# Patient Record
Sex: Female | Born: 1935 | Race: White | Hispanic: No | Smoking: Never smoker
Health system: Southern US, Community
[De-identification: ages and names within clinical notes are randomized; demographics above are authoritative.]

## PROBLEM LIST (undated history)

## (undated) DIAGNOSIS — N3281 Overactive bladder: Secondary | ICD-10-CM

## (undated) DIAGNOSIS — C801 Malignant (primary) neoplasm, unspecified: Secondary | ICD-10-CM

## (undated) DIAGNOSIS — Z972 Presence of dental prosthetic device (complete) (partial): Secondary | ICD-10-CM

## (undated) DIAGNOSIS — M1990 Unspecified osteoarthritis, unspecified site: Secondary | ICD-10-CM

## (undated) DIAGNOSIS — Z974 Presence of external hearing-aid: Secondary | ICD-10-CM

## (undated) DIAGNOSIS — H3530 Unspecified macular degeneration: Secondary | ICD-10-CM

## (undated) DIAGNOSIS — G8929 Other chronic pain: Secondary | ICD-10-CM

## (undated) DIAGNOSIS — I10 Essential (primary) hypertension: Secondary | ICD-10-CM

## (undated) DIAGNOSIS — K56609 Unspecified intestinal obstruction, unspecified as to partial versus complete obstruction: Secondary | ICD-10-CM

## (undated) DIAGNOSIS — T7840XA Allergy, unspecified, initial encounter: Secondary | ICD-10-CM

## (undated) DIAGNOSIS — M542 Cervicalgia: Secondary | ICD-10-CM

## (undated) DIAGNOSIS — R42 Dizziness and giddiness: Secondary | ICD-10-CM

## (undated) DIAGNOSIS — R32 Unspecified urinary incontinence: Secondary | ICD-10-CM

## (undated) DIAGNOSIS — N390 Urinary tract infection, site not specified: Secondary | ICD-10-CM

## (undated) DIAGNOSIS — M810 Age-related osteoporosis without current pathological fracture: Secondary | ICD-10-CM

## (undated) DIAGNOSIS — G2581 Restless legs syndrome: Secondary | ICD-10-CM

## (undated) DIAGNOSIS — M4800 Spinal stenosis, site unspecified: Secondary | ICD-10-CM

## (undated) HISTORY — PX: FRACTURE SURGERY: SHX138

## (undated) HISTORY — DX: Other chronic pain: G89.29

## (undated) HISTORY — PX: BLADDER SURGERY: SHX569

## (undated) HISTORY — PX: APPENDECTOMY: SHX54

## (undated) HISTORY — DX: Malignant (primary) neoplasm, unspecified: C80.1

## (undated) HISTORY — DX: Urinary tract infection, site not specified: N39.0

## (undated) HISTORY — DX: Unspecified urinary incontinence: R32

## (undated) HISTORY — PX: OTHER SURGICAL HISTORY: SHX169

## (undated) HISTORY — PX: HIP SURGERY: SHX245

## (undated) HISTORY — PX: CARPAL TUNNEL RELEASE: SHX101

## (undated) HISTORY — DX: Cervicalgia: M54.2

## (undated) HISTORY — DX: Allergy, unspecified, initial encounter: T78.40XA

## (undated) HISTORY — PX: TONSILLECTOMY: SUR1361

## (undated) HISTORY — DX: Unspecified macular degeneration: H35.30

## (undated) HISTORY — PX: ABDOMINAL HYSTERECTOMY: SHX81

## (undated) HISTORY — DX: Restless legs syndrome: G25.81

## (undated) HISTORY — DX: Essential (primary) hypertension: I10

## (undated) HISTORY — DX: Unspecified intestinal obstruction, unspecified as to partial versus complete obstruction: K56.609

## (undated) HISTORY — DX: Overactive bladder: N32.81

## (undated) HISTORY — DX: Unspecified osteoarthritis, unspecified site: M19.90

---

## 1968-03-16 HISTORY — PX: BREAST EXCISIONAL BIOPSY: SUR124

## 2004-03-06 ENCOUNTER — Ambulatory Visit: Payer: Self-pay | Admitting: Internal Medicine

## 2004-03-18 ENCOUNTER — Ambulatory Visit: Payer: Self-pay | Admitting: Internal Medicine

## 2005-03-26 ENCOUNTER — Ambulatory Visit: Payer: Self-pay

## 2005-08-20 ENCOUNTER — Emergency Department: Payer: Self-pay | Admitting: Emergency Medicine

## 2005-08-31 ENCOUNTER — Emergency Department: Payer: Self-pay | Admitting: Emergency Medicine

## 2005-10-23 ENCOUNTER — Ambulatory Visit: Payer: Self-pay | Admitting: Unknown Physician Specialty

## 2005-10-23 ENCOUNTER — Other Ambulatory Visit: Payer: Self-pay

## 2005-10-29 ENCOUNTER — Inpatient Hospital Stay: Payer: Self-pay | Admitting: Unknown Physician Specialty

## 2006-04-29 ENCOUNTER — Ambulatory Visit: Payer: Self-pay | Admitting: Internal Medicine

## 2007-06-22 ENCOUNTER — Ambulatory Visit: Payer: Self-pay | Admitting: Internal Medicine

## 2007-07-06 ENCOUNTER — Ambulatory Visit: Payer: Self-pay | Admitting: Internal Medicine

## 2008-07-06 ENCOUNTER — Ambulatory Visit: Payer: Self-pay | Admitting: Internal Medicine

## 2009-01-31 ENCOUNTER — Ambulatory Visit: Payer: Self-pay | Admitting: Urology

## 2009-02-12 ENCOUNTER — Ambulatory Visit: Payer: Self-pay | Admitting: Urology

## 2009-07-10 ENCOUNTER — Ambulatory Visit: Payer: Self-pay | Admitting: Internal Medicine

## 2009-11-27 ENCOUNTER — Ambulatory Visit: Payer: Self-pay | Admitting: Internal Medicine

## 2009-12-14 ENCOUNTER — Ambulatory Visit: Payer: Self-pay | Admitting: Internal Medicine

## 2010-07-28 ENCOUNTER — Ambulatory Visit: Payer: Self-pay | Admitting: Internal Medicine

## 2010-08-12 ENCOUNTER — Ambulatory Visit: Payer: Self-pay | Admitting: Internal Medicine

## 2010-08-19 ENCOUNTER — Ambulatory Visit: Payer: Self-pay | Admitting: Internal Medicine

## 2010-10-16 ENCOUNTER — Ambulatory Visit: Payer: Self-pay | Admitting: Unknown Physician Specialty

## 2011-06-23 ENCOUNTER — Ambulatory Visit: Payer: Self-pay | Admitting: Internal Medicine

## 2011-06-25 LAB — PROT IMMUNOELECTROPHORES(ARMC)

## 2011-07-15 ENCOUNTER — Ambulatory Visit: Payer: Self-pay | Admitting: Internal Medicine

## 2011-07-29 ENCOUNTER — Ambulatory Visit: Payer: Self-pay | Admitting: Internal Medicine

## 2012-08-02 ENCOUNTER — Ambulatory Visit: Payer: Self-pay | Admitting: Internal Medicine

## 2013-02-13 ENCOUNTER — Ambulatory Visit: Payer: Self-pay

## 2013-04-12 DIAGNOSIS — R350 Frequency of micturition: Secondary | ICD-10-CM | POA: Insufficient documentation

## 2013-04-12 DIAGNOSIS — N3941 Urge incontinence: Secondary | ICD-10-CM | POA: Insufficient documentation

## 2013-04-12 DIAGNOSIS — N3020 Other chronic cystitis without hematuria: Secondary | ICD-10-CM | POA: Insufficient documentation

## 2013-04-12 DIAGNOSIS — R339 Retention of urine, unspecified: Secondary | ICD-10-CM | POA: Insufficient documentation

## 2013-06-08 ENCOUNTER — Ambulatory Visit (INDEPENDENT_AMBULATORY_CARE_PROVIDER_SITE_OTHER): Payer: Medicare Other

## 2013-06-08 ENCOUNTER — Ambulatory Visit (INDEPENDENT_AMBULATORY_CARE_PROVIDER_SITE_OTHER): Payer: Medicare Other | Admitting: Podiatrist

## 2013-06-08 ENCOUNTER — Encounter: Payer: Self-pay | Admitting: Podiatrist

## 2013-06-08 VITALS — BP 133/64 | HR 69 | Resp 16 | Ht 67.0 in | Wt 150.0 lb

## 2013-06-08 DIAGNOSIS — B351 Tinea unguium: Secondary | ICD-10-CM

## 2013-06-08 DIAGNOSIS — M79609 Pain in unspecified limb: Secondary | ICD-10-CM

## 2013-06-08 DIAGNOSIS — M79673 Pain in unspecified foot: Secondary | ICD-10-CM

## 2013-06-08 DIAGNOSIS — M2040 Other hammer toe(s) (acquired), unspecified foot: Secondary | ICD-10-CM

## 2013-06-08 NOTE — Patient Instructions (Signed)
Hammer Toes Hammer toes is a condition in which one or more of your toes is permanently flexed. CAUSES  This happens when a muscle imbalance or abnormal bone length makes your small toes buckle. This causes the toe joint to contract and the strong cord-like bands that attach muscles to the bones (tendons) in your toes to shorten.  SIGNS AND SYMPTOMS  Common symptoms of flexible hammer toes include:   A build up of skin cells (Corns). Corns occur where boney bumps come in frequent contact with hard surfaces. For example, where your shoes press and rub.  Irritation.  Inflammation.  Pain.  Limited motion in your toes DIAGNOSIS  Hammer toes are diagnosed through a physical exam of your toes.During the exam, your health care provider may try to reproduce your symptoms by manipulating your foot. Often, x-ray exams are done to determine the degree of deformity and to make sure that the cause is not a fracture.  TREATMENT  Hammer toes can be treated with corrective surgery. There are several types of surgical procedures that can treat hammer toes. The most common procedures include:  Arthroplasty A portion of the joint is surgically removed and your toe is straightened. The gap fills in with fibrous tissue. This procedure helps treat pain and deformity and helps restore function.  Fusion Cartilage between the two bones of the affected joint is taken out and the bones fuse together into one longer bone. This helps keep your toe stable and reduces pain but leaves your toe stiff, yet straight.  Implantation A portion of your bone is removed and replaced with an implant to restore motion.  Flexor tendon transfers This procedure repositions the tendons that curl the toes down (flexor tendons). This may be done to release the deforming force that causes your toe to buckle. Several of these procedures require fixing your toe with a pin that is visible at the tip of your toe. The pin keeps the toe  straight during healing. Your health care provider will remove the pin usually within 4 8 weeks after the procedure.  Document Released: 02/28/2000 Document Revised: 12/21/2012 Document Reviewed: 11/07/2012 [employer] Patient Information 2014 [employer].  

## 2013-06-08 NOTE — Progress Notes (Signed)
   Subjective:    Patient ID: Kristine Jacobson, female    DOB: 09-Jul-1923, 80 y.o.   MRN: [id number]  HPI Comments: N pain/ burn L b/l feet only toes/ trim toenails D 3 - 22yrs O slowly C worse A heat,  T took rx?. otc rx, soaks feet in epson salt, pt trims toenails      Review of Systems  Constitutional: Positive for fatigue.       Weight changes  HENT: Positive for hearing loss.        Ringing in ears Sinus problems  Eyes: Positive for itching and visual disturbance.  Respiratory: Negative.   Cardiovascular: Negative.   Gastrointestinal: Negative.   Endocrine:       Increase urination  Genitourinary: Positive for urgency and frequency.  Musculoskeletal:       Joint pain Back pain Muscle pain   Skin:       Change in nails  Allergic/Immunologic: Negative.   Neurological: Positive for weakness and light-headedness.  Hematological: Bruises/bleeds easily.  Psychiatric/Behavioral: Negative.        Objective:   Physical Exam  GENERAL APPEARANCE: Alert, conversant. Appropriately groomed. No acute distress.  VASCULAR: Pedal pulses palpable at 1/4 DP and PT bilateral.  Capillary refill time is immediate to all digits,  Proximal to distal cooling it warm to warm.  Digital hair growth is present bilateral  NEUROLOGIC: sensation is intact epicritically and protectively to 5.07 monofilament at 5/5 sites bilateral.  Light touch is intact bilateral, vibratory sensation intact bilateral, achilles tendon reflex is intact bilateral.  MUSCULOSKELETAL: semirigid contracture of digits 2,3,4 of bilateral feet are noted.  Discomfort at the tips of the toes is present with pressure and examination;  Mild pronated foot type is present DERMATOLOGIC: nails are thick, discolored, and elongated.  Keratotic lesions are present at the tips of toes 2,3,4 left and 2 right.  Pressure and shear forces are causing pain.       Assessment & Plan:  Hammertoe contracture with keratotic lesions at distal  tips,  Symptomatic mycotic toenails.  Plan:  Debrided the toenails without complication today.  Discussed conservative vs. Surgical therapies for the hammertoes including padding, shoegear changes, and staying off her feet more.  The patient would like to know more about surgery.  We discussed a hammertoe procedure to fuse the digits in a straightened position with temporary kwires or screws. She will consider her options and will call if she would like to schedule surgery.  I will see her for a pre op consult at that time.

## 2013-08-21 ENCOUNTER — Ambulatory Visit: Payer: Self-pay | Admitting: Internal Medicine

## 2013-11-22 DIAGNOSIS — R109 Unspecified abdominal pain: Secondary | ICD-10-CM | POA: Insufficient documentation

## 2013-11-29 ENCOUNTER — Ambulatory Visit: Payer: Self-pay

## 2014-08-15 ENCOUNTER — Other Ambulatory Visit: Payer: Self-pay | Admitting: Internal Medicine

## 2014-08-15 DIAGNOSIS — M5442 Lumbago with sciatica, left side: Principal | ICD-10-CM

## 2014-08-15 DIAGNOSIS — M5441 Lumbago with sciatica, right side: Secondary | ICD-10-CM

## 2014-08-24 ENCOUNTER — Ambulatory Visit: Payer: Self-pay

## 2014-08-30 ENCOUNTER — Ambulatory Visit
Admission: RE | Admit: 2014-08-30 | Discharge: 2014-08-30 | Disposition: A | Discharge status: Home / Self Care | Payer: Medicare Other | Source: Ambulatory Visit | Attending: Internal Medicine | Admitting: Internal Medicine

## 2014-08-30 DIAGNOSIS — M5186 Other intervertebral disc disorders, lumbar region: Secondary | ICD-10-CM | POA: Diagnosis not present

## 2014-08-30 DIAGNOSIS — M5442 Lumbago with sciatica, left side: Secondary | ICD-10-CM

## 2014-08-30 DIAGNOSIS — M4806 Spinal stenosis, lumbar region: Secondary | ICD-10-CM | POA: Diagnosis not present

## 2014-08-30 DIAGNOSIS — M5136 Other intervertebral disc degeneration, lumbar region: Secondary | ICD-10-CM | POA: Diagnosis present

## 2014-08-30 DIAGNOSIS — M5441 Lumbago with sciatica, right side: Secondary | ICD-10-CM

## 2014-09-21 DIAGNOSIS — N393 Stress incontinence (female) (male): Secondary | ICD-10-CM | POA: Insufficient documentation

## 2015-01-07 ENCOUNTER — Other Ambulatory Visit: Payer: Self-pay | Admitting: Internal Medicine

## 2015-01-07 ENCOUNTER — Ambulatory Visit
Admission: RE | Admit: 2015-01-07 | Discharge: 2015-01-07 | Disposition: A | Discharge status: Home / Self Care | Payer: Medicare Other | Source: Ambulatory Visit | Attending: Internal Medicine | Admitting: Internal Medicine

## 2015-01-07 DIAGNOSIS — R1030 Lower abdominal pain, unspecified: Secondary | ICD-10-CM | POA: Insufficient documentation

## 2015-01-25 ENCOUNTER — Other Ambulatory Visit: Payer: Self-pay | Admitting: Internal Medicine

## 2015-01-25 DIAGNOSIS — R1010 Upper abdominal pain, unspecified: Secondary | ICD-10-CM

## 2015-02-05 ENCOUNTER — Other Ambulatory Visit: Payer: Medicare Other

## 2015-02-05 ENCOUNTER — Encounter
Admission: RE | Admit: 2015-02-05 | Discharge: 2015-02-05 | Disposition: A | Discharge status: Home / Self Care | Payer: Medicare Other | Source: Ambulatory Visit | Attending: Internal Medicine | Admitting: Internal Medicine

## 2015-02-05 DIAGNOSIS — R1010 Upper abdominal pain, unspecified: Secondary | ICD-10-CM | POA: Diagnosis not present

## 2015-02-05 MED ORDER — TECHNETIUM TC 99M MEBROFENIN IV KIT
5.0000 | PACK | Freq: Once | INTRAVENOUS | Status: DC | PRN
  Administered 2015-02-05: 5.062 via INTRAVENOUS
  Filled 2015-02-05: qty 6

## 2015-02-05 MED ORDER — SINCALIDE 5 MCG IJ SOLR
0.0200 ug/kg | Freq: Once | INTRAMUSCULAR | Status: AC
  Administered 2015-02-05: 1.36 ug via INTRAVENOUS

## 2015-02-19 ENCOUNTER — Other Ambulatory Visit: Payer: Self-pay | Admitting: Internal Medicine

## 2015-02-19 DIAGNOSIS — Z1231 Encounter for screening mammogram for malignant neoplasm of breast: Secondary | ICD-10-CM

## 2015-02-27 ENCOUNTER — Encounter
Admission: EM | Disposition: A | Discharge status: Skilled Nursing Facility | Payer: Self-pay | Source: Home / Self Care | Attending: Internal Medicine

## 2015-02-27 ENCOUNTER — Encounter: Payer: Self-pay | Admitting: Emergency Medicine

## 2015-02-27 ENCOUNTER — Inpatient Hospital Stay: Payer: Medicare Other | Admitting: Anesthesiology

## 2015-02-27 ENCOUNTER — Inpatient Hospital Stay: Payer: Medicare Other

## 2015-02-27 ENCOUNTER — Emergency Department: Payer: Medicare Other

## 2015-02-27 ENCOUNTER — Ambulatory Visit: Payer: Medicare Other

## 2015-02-27 ENCOUNTER — Inpatient Hospital Stay
Admission: EM | Admit: 2015-02-27 | Discharge: 2015-03-05 | DRG: 482 | Disposition: A | Discharge status: Skilled Nursing Facility | Payer: Medicare Other | Attending: Internal Medicine | Admitting: Internal Medicine

## 2015-02-27 DIAGNOSIS — M1990 Unspecified osteoarthritis, unspecified site: Secondary | ICD-10-CM | POA: Diagnosis present

## 2015-02-27 DIAGNOSIS — K5900 Constipation, unspecified: Secondary | ICD-10-CM | POA: Diagnosis present

## 2015-02-27 DIAGNOSIS — D649 Anemia, unspecified: Secondary | ICD-10-CM | POA: Diagnosis present

## 2015-02-27 DIAGNOSIS — Z9889 Other specified postprocedural states: Secondary | ICD-10-CM | POA: Diagnosis not present

## 2015-02-27 DIAGNOSIS — S72002A Fracture of unspecified part of neck of left femur, initial encounter for closed fracture: Secondary | ICD-10-CM | POA: Diagnosis present

## 2015-02-27 DIAGNOSIS — Y92009 Unspecified place in unspecified non-institutional (private) residence as the place of occurrence of the external cause: Secondary | ICD-10-CM

## 2015-02-27 DIAGNOSIS — G2581 Restless legs syndrome: Secondary | ICD-10-CM | POA: Diagnosis present

## 2015-02-27 DIAGNOSIS — W19XXXA Unspecified fall, initial encounter: Secondary | ICD-10-CM

## 2015-02-27 DIAGNOSIS — Z833 Family history of diabetes mellitus: Secondary | ICD-10-CM | POA: Diagnosis not present

## 2015-02-27 DIAGNOSIS — R0902 Hypoxemia: Secondary | ICD-10-CM

## 2015-02-27 DIAGNOSIS — N3281 Overactive bladder: Secondary | ICD-10-CM | POA: Diagnosis present

## 2015-02-27 DIAGNOSIS — S72142A Displaced intertrochanteric fracture of left femur, initial encounter for closed fracture: Secondary | ICD-10-CM | POA: Diagnosis not present

## 2015-02-27 DIAGNOSIS — Z79899 Other long term (current) drug therapy: Secondary | ICD-10-CM

## 2015-02-27 DIAGNOSIS — I10 Essential (primary) hypertension: Secondary | ICD-10-CM | POA: Diagnosis present

## 2015-02-27 DIAGNOSIS — H3530 Unspecified macular degeneration: Secondary | ICD-10-CM | POA: Diagnosis present

## 2015-02-27 DIAGNOSIS — W010XXA Fall on same level from slipping, tripping and stumbling without subsequent striking against object, initial encounter: Secondary | ICD-10-CM | POA: Diagnosis present

## 2015-02-27 DIAGNOSIS — Z886 Allergy status to analgesic agent status: Secondary | ICD-10-CM | POA: Diagnosis not present

## 2015-02-27 DIAGNOSIS — J9601 Acute respiratory failure with hypoxia: Secondary | ICD-10-CM

## 2015-02-27 DIAGNOSIS — R509 Fever, unspecified: Secondary | ICD-10-CM

## 2015-02-27 DIAGNOSIS — M810 Age-related osteoporosis without current pathological fracture: Secondary | ICD-10-CM | POA: Diagnosis present

## 2015-02-27 DIAGNOSIS — Z90710 Acquired absence of both cervix and uterus: Secondary | ICD-10-CM

## 2015-02-27 DIAGNOSIS — Z419 Encounter for procedure for purposes other than remedying health state, unspecified: Secondary | ICD-10-CM

## 2015-02-27 HISTORY — PX: INTRAMEDULLARY (IM) NAIL INTERTROCHANTERIC: SHX5875

## 2015-02-27 HISTORY — DX: Spinal stenosis, site unspecified: M48.00

## 2015-02-27 HISTORY — DX: Age-related osteoporosis without current pathological fracture: M81.0

## 2015-02-27 LAB — CBC WITH DIFFERENTIAL/PLATELET
BASOS PCT: 1 %
Basophils Absolute: 0.1 10*3/uL (ref 0–0.1)
EOS ABS: 0 10*3/uL (ref 0–0.7)
Eosinophils Relative: 0 %
HCT: 38.3 % (ref 35.0–47.0)
HEMOGLOBIN: 12.9 g/dL (ref 12.0–16.0)
LYMPHS ABS: 1.2 10*3/uL (ref 1.0–3.6)
Lymphocytes Relative: 10 %
MCH: 30.3 pg (ref 26.0–34.0)
MCHC: 33.7 g/dL (ref 32.0–36.0)
MCV: 89.9 fL (ref 80.0–100.0)
Monocytes Absolute: 0.6 10*3/uL (ref 0.2–0.9)
Monocytes Relative: 6 %
NEUTROS PCT: 83 %
Neutro Abs: 9.3 10*3/uL — ABNORMAL HIGH (ref 1.4–6.5)
Platelets: 167 10*3/uL (ref 150–440)
RBC: 4.26 MIL/uL (ref 3.80–5.20)
RDW: 13.7 % (ref 11.5–14.5)
WBC: 11.2 10*3/uL — AB (ref 3.6–11.0)

## 2015-02-27 LAB — COMPREHENSIVE METABOLIC PANEL
ALBUMIN: 4.3 g/dL (ref 3.5–5.0)
ALK PHOS: 66 U/L (ref 38–126)
ALT: 26 U/L (ref 14–54)
AST: 34 U/L (ref 15–41)
Anion gap: 9 (ref 5–15)
BUN: 21 mg/dL — ABNORMAL HIGH (ref 6–20)
CALCIUM: 9.3 mg/dL (ref 8.9–10.3)
CO2: 25 mmol/L (ref 22–32)
CREATININE: 0.68 mg/dL (ref 0.44–1.00)
Chloride: 103 mmol/L (ref 101–111)
GFR calc Af Amer: >60 mL/min (ref >60)
GFR calc non Af Amer: >60 mL/min (ref >60)
GLUCOSE: 147 mg/dL — AB (ref 65–99)
Potassium: 3.5 mmol/L (ref 3.5–5.1)
SODIUM: 137 mmol/L (ref 135–145)
Total Bilirubin: 0.8 mg/dL (ref 0.3–1.2)
Total Protein: 7.6 g/dL (ref 6.5–8.1)

## 2015-02-27 LAB — URINALYSIS COMPLETE WITH MICROSCOPIC (ARMC ONLY)
BACTERIA UA: NONE SEEN
Bilirubin Urine: NEGATIVE
GLUCOSE, UA: NEGATIVE mg/dL
HGB URINE DIPSTICK: NEGATIVE
Leukocytes, UA: NEGATIVE
Nitrite: NEGATIVE
Protein, ur: 30 mg/dL — AB
Specific Gravity, Urine: 1.017 (ref 1.005–1.030)
pH: 5 (ref 5.0–8.0)

## 2015-02-27 LAB — TYPE AND SCREEN
ABO/RH(D): A POS
ANTIBODY SCREEN: NEGATIVE

## 2015-02-27 LAB — SURGICAL PCR SCREEN
MRSA, PCR: NEGATIVE
STAPHYLOCOCCUS AUREUS: NEGATIVE

## 2015-02-27 LAB — TROPONIN I

## 2015-02-27 SURGERY — FIXATION, FRACTURE, INTERTROCHANTERIC, WITH INTRAMEDULLARY ROD
Anesthesia: Spinal | Site: Hip | Laterality: Left | Wound class: Clean

## 2015-02-27 MED ORDER — ONDANSETRON HCL 4 MG/2ML IJ SOLN: 4.0000 mg | Freq: Once | INTRAMUSCULAR | Status: DC | PRN

## 2015-02-27 MED ORDER — LIDOCAINE HCL 1 % IJ SOLN
INTRAMUSCULAR | Status: DC | PRN
  Administered 2015-02-27: 10 mL

## 2015-02-27 MED ORDER — NEOMYCIN-POLYMYXIN B GU 40-200000 IR SOLN
Status: DC | PRN
  Administered 2015-02-27: 4 mL

## 2015-02-27 MED ORDER — SODIUM CHLORIDE 0.9 % IV SOLN
INTRAVENOUS | Status: DC
  Administered 2015-02-27: 12:00:00 via INTRAVENOUS

## 2015-02-27 MED ORDER — PROPOFOL 500 MG/50ML IV EMUL
INTRAVENOUS | Status: DC | PRN
  Administered 2015-02-27: 15 ug/kg/min via INTRAVENOUS

## 2015-02-27 MED ORDER — ENOXAPARIN SODIUM 40 MG/0.4ML ~~LOC~~ SOLN: 40.0000 mg | SUBCUTANEOUS | Status: DC

## 2015-02-27 MED ORDER — LACTATED RINGERS IV SOLN
INTRAVENOUS | Status: DC | PRN
  Administered 2015-02-27: 21:00:00 via INTRAVENOUS

## 2015-02-27 MED ORDER — LIDOCAINE HCL (PF) 1 % IJ SOLN
INTRAMUSCULAR | Status: AC
  Filled 2015-02-27: qty 30

## 2015-02-27 MED ORDER — EPINEPHRINE HCL 1 MG/ML IJ SOLN
INTRAMUSCULAR | Status: DC | PRN
  Administered 2015-02-27: .2 mg via INTRATRACHEAL

## 2015-02-27 MED ORDER — NEOMYCIN-POLYMYXIN B GU 40-200000 IR SOLN
Status: AC
  Filled 2015-02-27: qty 4

## 2015-02-27 MED ORDER — FENTANYL CITRATE (PF) 100 MCG/2ML IJ SOLN: 25.0000 ug | INTRAMUSCULAR | Status: DC | PRN

## 2015-02-27 MED ORDER — ASPIRIN 81 MG PO CHEW
81.0000 mg | CHEWABLE_TABLET | Freq: Every day | ORAL | Status: DC
  Administered 2015-02-28 – 2015-03-03 (×4): 81 mg via ORAL
  Filled 2015-02-27 (×4): qty 1

## 2015-02-27 MED ORDER — LINACLOTIDE 145 MCG PO CAPS
145.0000 ug | ORAL_CAPSULE | ORAL | Status: DC
  Administered 2015-02-28 – 2015-03-04 (×2): 145 ug via ORAL
  Filled 2015-02-27 (×2): qty 1

## 2015-02-27 MED ORDER — CARVEDILOL 3.125 MG PO TABS
3.1250 mg | ORAL_TABLET | Freq: Two times a day (BID) | ORAL | Status: DC
  Filled 2015-02-27: qty 1

## 2015-02-27 MED ORDER — HYDROCHLOROTHIAZIDE 12.5 MG PO CAPS
12.5000 mg | ORAL_CAPSULE | Freq: Every day | ORAL | Status: DC
  Administered 2015-02-28 – 2015-03-05 (×6): 12.5 mg via ORAL
  Filled 2015-02-27 (×8): qty 1

## 2015-02-27 MED ORDER — GABAPENTIN 100 MG PO CAPS
100.0000 mg | ORAL_CAPSULE | Freq: Three times a day (TID) | ORAL | Status: DC
  Administered 2015-02-28 – 2015-03-05 (×17): 100 mg via ORAL
  Filled 2015-02-27 (×17): qty 1

## 2015-02-27 MED ORDER — MORPHINE SULFATE (PF) 4 MG/ML IV SOLN
INTRAVENOUS | Status: AC
  Administered 2015-02-27: 4 mg via INTRAVENOUS
  Filled 2015-02-27: qty 1

## 2015-02-27 MED ORDER — BUPIVACAINE HCL (PF) 0.75 % IJ SOLN
INTRAMUSCULAR | Status: DC | PRN
  Administered 2015-02-27: 1.8 mL via INTRATHECAL

## 2015-02-27 MED ORDER — CARVEDILOL 3.125 MG PO TABS
3.1250 mg | ORAL_TABLET | Freq: Two times a day (BID) | ORAL | Status: DC
  Administered 2015-02-27 – 2015-03-05 (×12): 3.125 mg via ORAL
  Filled 2015-02-27 (×11): qty 1

## 2015-02-27 MED ORDER — MORPHINE SULFATE (PF) 2 MG/ML IV SOLN
2.0000 mg | INTRAVENOUS | Status: DC | PRN
  Administered 2015-02-27 (×2): 2 mg via INTRAVENOUS
  Filled 2015-02-27 (×2): qty 1

## 2015-02-27 MED ORDER — AMLODIPINE BESYLATE 5 MG PO TABS: 2.5000 mg | ORAL_TABLET | Freq: Every day | ORAL | Status: DC

## 2015-02-27 MED ORDER — AMLODIPINE BESYLATE 5 MG PO TABS
5.0000 mg | ORAL_TABLET | Freq: Every day | ORAL | Status: DC
  Administered 2015-02-28 – 2015-03-05 (×6): 5 mg via ORAL
  Filled 2015-02-27 (×6): qty 1

## 2015-02-27 MED ORDER — ACETAMINOPHEN 10 MG/ML IV SOLN
INTRAVENOUS | Status: AC
  Filled 2015-02-27: qty 100

## 2015-02-27 MED ORDER — ROPINIROLE HCL 0.25 MG PO TABS
0.5000 mg | ORAL_TABLET | Freq: Three times a day (TID) | ORAL | Status: DC
  Administered 2015-02-28 – 2015-03-05 (×17): 0.5 mg via ORAL
  Filled 2015-02-27 (×17): qty 2

## 2015-02-27 MED ORDER — CEFAZOLIN SODIUM-DEXTROSE 2-3 GM-% IV SOLR
2.0000 g | INTRAVENOUS | Status: AC
  Administered 2015-02-27: 2 g via INTRAVENOUS
  Filled 2015-02-27: qty 50

## 2015-02-27 MED ORDER — ACETAMINOPHEN 10 MG/ML IV SOLN
INTRAVENOUS | Status: DC | PRN
  Administered 2015-02-27: 1000 mg via INTRAVENOUS

## 2015-02-27 MED ORDER — KETAMINE HCL 10 MG/ML IJ SOLN
INTRAMUSCULAR | Status: DC | PRN
  Administered 2015-02-27: 5 mg via INTRAVENOUS

## 2015-02-27 MED ORDER — PANTOPRAZOLE SODIUM 40 MG PO TBEC: 40.0000 mg | DELAYED_RELEASE_TABLET | Freq: Every day | ORAL | Status: DC

## 2015-02-27 MED ORDER — LOSARTAN POTASSIUM-HCTZ 100-12.5 MG PO TABS: 1.0000 | ORAL_TABLET | Freq: Every day | ORAL | Status: DC

## 2015-02-27 MED ORDER — LOSARTAN POTASSIUM 50 MG PO TABS
100.0000 mg | ORAL_TABLET | Freq: Every day | ORAL | Status: DC
  Administered 2015-02-28 – 2015-03-05 (×6): 100 mg via ORAL
  Filled 2015-02-27 (×6): qty 2

## 2015-02-27 MED ORDER — MORPHINE SULFATE (PF) 4 MG/ML IV SOLN
4.0000 mg | Freq: Once | INTRAVENOUS | Status: AC
  Administered 2015-02-27: 4 mg via INTRAVENOUS

## 2015-02-27 MED ORDER — MIDAZOLAM HCL 5 MG/5ML IJ SOLN
INTRAMUSCULAR | Status: DC | PRN
  Administered 2015-02-27 (×2): 0.5 mg via INTRAVENOUS

## 2015-02-27 MED ORDER — FENTANYL CITRATE (PF) 100 MCG/2ML IJ SOLN
INTRAMUSCULAR | Status: DC | PRN
  Administered 2015-02-27 (×2): 50 ug via INTRAVENOUS

## 2015-02-27 SURGICAL SUPPLY — 31 items
CANISTER SUCT 1200ML W/VALVE (MISCELLANEOUS) ×2 IMPLANT
DRAPE C-ARMOR (DRAPES) IMPLANT
DRAPE SHEET LG 3/4 BI-LAMINATE (DRAPES) IMPLANT
DRAPE TABLE BACK 80X90 (DRAPES) ×2 IMPLANT
DRSG DERMACEA 8X12 NADH (GAUZE/BANDAGES/DRESSINGS) ×2 IMPLANT
DRSG OPSITE POSTOP 4X12 (GAUZE/BANDAGES/DRESSINGS) ×2 IMPLANT
DURAPREP 26ML APPLICATOR (WOUND CARE) ×2 IMPLANT
GAUZE SPONGE 4X4 12PLY STRL (GAUZE/BANDAGES/DRESSINGS) IMPLANT
GLOVE BIO SURGEON STRL SZ7.5 (GLOVE) ×4 IMPLANT
GLOVE BIO SURGEON STRL SZ8 (GLOVE) ×2 IMPLANT
GLOVE BIOGEL M STRL SZ7.5 (GLOVE) ×4 IMPLANT
GLOVE INDICATOR 8.0 STRL GRN (GLOVE) ×2 IMPLANT
GLOVE SURG XRAY 8.5 LX (GLOVE) IMPLANT
GOWN STRL REUS W/ TWL LRG LVL4 (GOWN DISPOSABLE) ×2 IMPLANT
GOWN STRL REUS W/TWL LRG LVL4 (GOWN DISPOSABLE) ×2
KIT RM TURNOVER CYSTO AR (KITS) ×2 IMPLANT
MAT BLUE FLOOR 46X72 FLO (MISCELLANEOUS) ×2 IMPLANT
NAIL TROCH FX 11/130D 170-S (Nail) ×2 IMPLANT
NS IRRIG 1000ML POUR BTL (IV SOLUTION) ×2 IMPLANT
PACK HIP COMPR (MISCELLANEOUS) ×2 IMPLANT
PAD GROUND ADULT SPLIT (MISCELLANEOUS) ×2 IMPLANT
REAMER ROD DEEP FLUTE 2.5X950 (INSTRUMENTS) ×2 IMPLANT
SOL PREP PVP 2OZ (MISCELLANEOUS) ×2
SOLUTION PREP PVP 2OZ (MISCELLANEOUS) ×1 IMPLANT
STAPLER SKIN PROX 35W (STAPLE) ×2 IMPLANT
SUCTION FRAZIER TIP 10 FR DISP (SUCTIONS) ×2 IMPLANT
SUT VIC AB 0 CT1 36 (SUTURE) ×2 IMPLANT
SUT VIC AB 1 CT1 36 (SUTURE) ×2 IMPLANT
SUT VIC AB 2-0 CT1 27 (SUTURE) ×1
SUT VIC AB 2-0 CT1 TAPERPNT 27 (SUTURE) ×1 IMPLANT
TAPE MICROFOAM 4IN (TAPE) IMPLANT

## 2015-02-27 NOTE — Progress Notes (Signed)
OR tech here to take patient for surgery, Report given to OR nurse, consents and abx on chart.  Patient taken to OR via bed, on oxygen at 2L per [place].

## 2015-02-27 NOTE — H&P (Signed)
[facility] [place] NAME: Kristine Jacobson    MR#:  [id number]  DATE OF BIRTH:  [birth year]  DATE OF ADMISSION:  02/27/2015  PRIMARY CARE PHYSICIAN: Lavera Guise, MD   REQUESTING/REFERRING PHYSICIAN: Dr. Esperanza Heir  CHIEF COMPLAINT:   Chief Complaint  Patient presents with  . Fall    HISTORY OF PRESENT ILLNESS:  Kristine Jacobson  is a 81 y.o. female with a known history of attention, overactive bladder, restless leg syndrome comes in because of fall and then left hip fracture. Patient had a mechanical fall tapered over the legs of recliner and then suffered a left hip fracture. Patient was visiting.daughter that had emergency appendectomy and slept very late like 2:00 in the morning. Did not have dizziness or chest pain. Had a mechanical fall.  PAST MEDICAL HISTORY:   Past Medical History  Diagnosis Date  . Arthritis   . Restless leg   . Overactive bladder   . Macular degeneration, age related   . HBP (high blood pressure)   . Spinal stenosis     PAST SURGICAL HISTOIRY:   Past Surgical History  Procedure Laterality Date  . Abdominal hysterectomy    . Tonsillectomy    . Bladder surgery    . Blocked intestine      SOCIAL HISTORY:   Social History  Substance Use Topics  . Smoking status: Never Smoker   . Smokeless tobacco: Not on file  . Alcohol Use: No    FAMILY HISTORY:  History reviewed. No pertinent family history. Family history significant for brother and sister have diabetes. DRUG ALLERGIES:   Allergies  Allergen Reactions  . Codeine Rash    REVIEW OF SYSTEMS:  CONSTITUTIONAL: No fever, fatigue or weakness.  EYES: No blurred or double vision.  EARS, NOSE, AND THROAT: No tinnitus or ear pain.  RESPIRATORY: No cough, shortness of breath, wheezing or hemoptysis.  CARDIOVASCULAR: No chest pain, orthopnea, edema.  GASTROINTESTINAL: No nausea, vomiting, diarrhea or abdominal pain.  GENITOURINARY: No  dysuria, hematuria.  ENDOCRINE: No polyuria, nocturia,  HEMATOLOGY: No anemia, easy bruising or bleeding SKIN: No rash or lesion. MUSCULOSKELETAL: No joint pain or arthritis.   NEUROLOGIC: No tingling, numbness, weakness.  PSYCHIATRY: No anxiety or depression.   MEDICATIONS AT HOME:   Prior to Admission medications   Medication Sig Start Date End Date Taking? Authorizing Provider  amLODipine (NORVASC) 2.5 MG tablet Take 2.5 mg by mouth daily.    Yes Historical Provider, MD  aspirin 81 MG tablet Take 81 mg by mouth daily.   Yes Historical Provider, MD  carvedilol (COREG) 3.125 MG tablet Take 3.125 mg by mouth 2 (two) times daily with a meal.   Yes Historical Provider, MD  gabapentin (NEURONTIN) 100 MG capsule Take 1 capsule by mouth 3 (three) times daily. 01/07/15  Yes Historical Provider, MD  LINZESS 145 MCG CAPS capsule Take 1 capsule by mouth 2 (two) times a week. 02/13/15  Yes Historical Provider, MD  losartan-hydrochlorothiazide (HYZAAR) 100-12.5 MG per tablet Take 1 tablet by mouth daily.   Yes Historical Provider, MD  rOPINIRole (REQUIP) 0.5 MG tablet Take 0.5 mg by mouth 3 (three) times daily.   Yes Historical Provider, MD      VITAL SIGNS:  Blood pressure 179/74, pulse 102, temperature 98 F (36.7 C), temperature source Oral, resp. rate 18, height 5\' 7"  (1.702 m), weight 68.04 kg (150 lb), SpO2 91 %.  PHYSICAL EXAMINATION:  GENERAL:  81  y.o.-year-old patient lying in the bed with no acute distress.  EYES: Pupils equal, round, reactive to light and accommodation. No scleral icterus. Extraocular muscles intact.  HEENT: Head atraumatic, normocephalic. Oropharynx and nasopharynx clear.  NECK:  Supple, no jugular venous distention. No thyroid enlargement, no tenderness.  LUNGS: Normal breath sounds bilaterally, no wheezing, rales,rhonchi or crepitation. No use of accessory muscles of respiration.  CARDIOVASCULAR: S1, S2 normal. No murmurs, rubs, or gallops.  ABDOMEN: Soft,  nontender, nondistended. Bowel sounds present. No organomegaly or mass.  EXTREMITIES: Shortening and rotation of the left leg.  NEUROLOGIC: Cranial nerves II through XII are intact. Muscle strength 5/5 in all extremities. Sensation intact. Gait not checked.  PSYCHIATRIC: The patient is alert and oriented x 3.  SKIN: No obvious rash, lesion, or ulcer.   LABORATORY PANEL:   CBC  Recent Labs Lab 02/27/15 0822  WBC 11.2*  HGB 12.9  HCT 38.3  PLT 167   ------------------------------------------------------------------------------------------------------------------  Chemistries   Recent Labs Lab 02/27/15 0822  NA 137  K 3.5  CL 103  CO2 25  GLUCOSE 147*  BUN 21*  CREATININE 0.68  CALCIUM 9.3  AST 34  ALT 26  ALKPHOS 66  BILITOT 0.8   ------------------------------------------------------------------------------------------------------------------  Cardiac Enzymes  Recent Labs Lab 02/27/15 0822  TROPONINI <0.03   ------------------------------------------------------------------------------------------------------------------  RADIOLOGY:  Dg Chest 1 View  02/27/2015  CLINICAL DATA:  Preoperative examination (hip fracture) EXAM: CHEST 1 VIEW COMPARISON:  None. FINDINGS: Borderline enlarged cardiac silhouette and mediastinal contours with tortuosity and possible mild ectasia of the thoracic aorta. Atherosclerotic plaque within the thoracic aorta. Partially calcified bilateral hilar lymph nodes, the sequela of prior granulomatous infection. There is mild diffuse slightly nodular thickening of the pulmonary station. Minimal left basilar linear heterogeneous opacities favored to represent atelectasis. No pleural effusion or pneumothorax. No definite evidence of edema. No definite acute osseous abnormalities. IMPRESSION: 1. Cardiomegaly and atherosclerosis without definite acute cardiopulmonary disease. 2. Partially calcified bilateral hilar lymph nodes, the sequela of prior  granulomatous infection. Electronically Signed   By: Sandi Mariscal M.D.   On: 02/27/2015 09:03   Dg Hip Unilat With Pelvis 2-3 Views Left  02/27/2015  CLINICAL DATA:  Fall at 2:30 this morning.  Unable to move leg. EXAM: DG HIP (WITH OR WITHOUT PELVIS) 2-3V LEFT COMPARISON:  None. FINDINGS: Acute left intertrochanteric fracture with mild varus angulation. Mild degenerative spurring of the acetabula bilaterally. No other fractures of the bony pelvis identified. IMPRESSION: 1. Acute left intertrochanteric fracture with mild varus angulation. Electronically Signed   By: Van Clines M.D.   On: 02/27/2015 09:03    EKG:   Orders placed or performed during the hospital encounter of 02/27/15  . ED EKG  . ED EKG  . EKG 12-Lead  . EKG 12-Lead   sinus rhythm at 76 bpm. No ST T changes.  IMPRESSION AND PLAN:   #1 acute left intertrochanteric fracture with mild varus angulation: Initial encounter: Admit to medical service, continue pain medications, gentle hydration, orthopedic consult with Dr. Marry Guan. Patient has no contraindication for surgery at this time.  #2 h.ypertension: Slightly high secondary to left hip pain.; Continue Coreg, Hyzaar, increase amlodipine..   3.RLS; continue Requip. 4. GI and DVT prophylaxis.  All the records are reviewed and case discussed with ED provider. Management plans discussed with the patient, family and they are in agreement.  CODE STATUS: full  TOTAL TIME TAKING CARE OF THIS PATIENT: 62minutes.    Epifanio Lesches M.D on 02/27/2015 at  11:53 AM  Between 7am to 6pm - Pager - [phone]  After 6pm go to [email or web address] - password [email or web address] [facility]  Office  [phone]  CC: Primary care physician; Lavera Guise, MD  Note: This dictation was prepared with Dragon dictation along with smaller phrase technology. Any transcriptional errors that result from this process are unintentional.

## 2015-02-27 NOTE — Brief Op Note (Signed)
02/27/2015  10:52 PM  PATIENT:  Charm Rings Quast  81 y.o. female  PRE-OPERATIVE DIAGNOSIS:  Left intertrochanteric femur fracture  POST-OPERATIVE DIAGNOSIS: Same  PROCEDURE:  Open reduction and internal fixation of a left intertrochanteric femur fracture  SURGEON:  Surgeon(s) and Role:    * Dereck Leep, MD - Primary  ASSISTANTS: none   ANESTHESIA:   spinal  EBL:  Total I/O In: 1000 [I.V.:1000] Out: 600 [Urine:350; Blood:250]  BLOOD ADMINISTERED:none  DRAINS: none   LOCAL MEDICATIONS USED:  LIDOCAINE   SPECIMEN:  No Specimen  DISPOSITION OF SPECIMEN:  N/A  COUNTS:  YES  TOURNIQUET:  * No tourniquets in log *  DICTATION: .Dragon Dictation  PLAN OF CARE: Admit to inpatient   PATIENT DISPOSITION:  PACU - hemodynamically stable.   Delay start of Pharmacological VTE agent (>24hrs) due to surgical blood loss or risk of bleeding: yes

## 2015-02-27 NOTE — Op Note (Signed)
OPERATIVE NOTE  DATE OF SURGERY:  02/27/2015  PATIENT NAME:  Kristine Jacobson   DOB: 10/26/1923  MRN: [id number]  PRE-OPERATIVE DIAGNOSIS: Left intertrochanteric femur fracture  POST-OPERATIVE DIAGNOSIS:  Same  PROCEDURE: Open reduction and internal fixation of a left intertrochanteric femur fracture   SURGEON:  Marciano Sequin. M.D.  ANESTHESIA: spinal  ESTIMATED BLOOD LOSS: 250 mL  FLUIDS REPLACED: 900 mL of crystalloid  DRAINS: None  IMPLANTS UTILIZED: Synthes 11 mm/130 trochanteric fixation nail, 123XX123 mm helical blade, 38 mm x 5.0 mm locking screw  INDICATIONS FOR SURGERY: Kristine Jacobson is a 81 y.o. year old female who fell and sustained a displaced left intertrochanteric femur fracture. After discussion of the risks and benefits of surgical intervention, the patient expressed understanding of the risks benefits and agree with plans for open reduction and internal fixation.   The risks, benefits, and alternatives were discussed at length including but not limited to the risks of infection, bleeding, nerve injury, stiffness, blood clots, the need for revision surgery, limb length inequality, cardiopulmonary complications, among others, and they were willing to proceed.  PROCEDURE IN DETAIL: The patient was brought into the operating room and, after adequate spinal anesthesia was achieved, patient was placed on the fracture table. All bony prominences were well-padded. The left lower extremity was placed in traction and a provisional reduction was performed and verified using the C-arm. The patient's left hip and leg were cleaned and prepped with alcohol and DuraPrep and draped in the usual sterile fashion. A "timeout" was performed as per usual protocol. A lateral incision was made extended from the proximal portion of the greater trochanter proximally. The fascia was incised in line with the skin incision and the fibers of the hip abductors were split in line. The tip of the greater  trochanter was palpated and a distally threaded guide pin was inserted into the tip of the greater trochanter and advanced into the medullary canal. Position was confirmed in both AP and lateral planes using the C-arm. A pilot hole was enlarged using a step drill. A Synthes 11 mm/130 trochanteric fixation nail was advanced over the guidepin and position confirmed using the C-arm. A second stab incision was made and the tissue protector was inserted through the outrigger device and advanced to the lateral cortex of the femur. A threaded screw guide pin was inserted into the femoral neck and head and position was again confirmed in both AP and lateral planes. Vision was were obtained and it was felt that a 123XX123 mm helical blade was appropriate. The cortex was reamed and then a cannulated reamer was advanced over the guidepin to the appropriate depth. A 123XX123 mm helical blade was then advanced over the guidepin and impacted into place. Good position was noted in multiple planes using the C-arm. The locking sleeve was engaged. Finally, a third stab incision was made and the tissue protector was inserted through the outrigger device and advanced the lateral cortex of the femur for placement of the distal locking screw. A 38 mm x 5.0 mm locking screw was then inserted. The outrigger device was removed. The hip was visualized in all planes using the C-arm with good reduction appreciated and good position of the hardware noted.  The wound was irrigated with copious amounts of normal saline with antibiotic solution and suctioned dry. Good hemostasis was appreciated. The fascia was reapproximated using interrupted sutures of #1 Vicryl. Subcutaneous tissue was approximated layers using first #0 Vicryl followed #2-0 Vicryl.  The skin was closed with skin staples. A sterile dressing was applied.  The patient tolerated the procedure well and was transported to the recovery room in stable condition.   Marciano Sequin., M.D.

## 2015-02-27 NOTE — ED Notes (Addendum)
Patient brought in by [facility], patient reports that she tripped and fell this morning about 2:30am. Patient with obvious shortening and rotation to the left leg. EMS gave patient 100 mg of fentanyl.

## 2015-02-27 NOTE — Transfer of Care (Signed)
Immediate Anesthesia Transfer of Care Note  Patient: Kristine Jacobson  Procedure(s) Performed: Procedure(s): INTRAMEDULLARY (IM) NAIL INTERTROCHANTRIC (Left)  Patient Location: PACU  Anesthesia Type:Spinal  Level of Consciousness: awake, alert , oriented and patient cooperative  Airway & Oxygen Therapy: Patient Spontanous Breathing and Patient connected to nasal cannula oxygen  Post-op Assessment: Report given to RN and Post -op Vital signs reviewed and stable  Post vital signs: Reviewed and stable  Last Vitals:  Filed Vitals:   02/27/15 1222 02/27/15 1522  BP: 150/75 168/81  Pulse: 98 93  Temp:  37.1 C  Resp:  16    Complications: No apparent anesthesia complications

## 2015-02-27 NOTE — Anesthesia Procedure Notes (Signed)
Spinal Patient location during procedure: OR Start time: 02/27/2015 8:38 PM Staffing Performed by: anesthesiologist  Preanesthetic Checklist Completed: patient identified, site marked, surgical consent, pre-op evaluation, timeout performed, IV checked, risks and benefits discussed and monitors and equipment checked Spinal Block Patient position: right lateral decubitus Prep: Betadine Patient monitoring: heart rate, cardiac monitor, continuous pulse ox and blood pressure Location: L4-5 Injection technique: single-shot Needle Needle type: Quincke  Needle gauge: 22 G Needle length: 9 cm Needle insertion depth: 8 cm Assessment Sensory level: T6 Additional Notes +csf 4Q vsst no paresthesia.  JA

## 2015-02-27 NOTE — Consult Note (Signed)
ORTHOPAEDIC CONSULTATION  PATIENT NAME: Kristine Jacobson DOB: May 19, 1923  MRN: [id number]  REQUESTING PHYSICIAN: Epifanio Lesches, MD  Chief Complaint: Left hip pain  HPI: Kristine Jacobson is a 81 y.o. female who complains of  left hip pain. The patient tripped last night on the edge of her recliner and fell, landing on her left hip and side. She was unable to stand or bear weight due to the left hip pain. She denied any loss of consciousness. She denied any other injuries.  Past Medical History  Diagnosis Date  . Arthritis   . Restless leg   . Overactive bladder   . Macular degeneration, age related   . HBP (high blood pressure)   . Spinal stenosis   . Osteoporosis    Past Surgical History  Procedure Laterality Date  . Abdominal hysterectomy    . Tonsillectomy    . Bladder surgery      bladder suspension  . Esophageal dilitation     Social History   Social History  . Marital Status: Unknown    Spouse Name: N/A  . Number of Children: N/A  . Years of Education: N/A   Social History Main Topics  . Smoking status: Never Smoker   . Smokeless tobacco: None  . Alcohol Use: No  . Drug Use: None  . Sexual Activity: Not Asked   Other Topics Concern  . None   Social History Narrative   Lives alone.   Family History  Problem Relation Age of Onset  . Diabetes Mother    Allergies  Allergen Reactions  . Codeine Rash   Prior to Admission medications   Medication Sig Start Date End Date Taking? Authorizing Provider  amLODipine (NORVASC) 2.5 MG tablet Take 2.5 mg by mouth daily.    Yes Historical Provider, MD  aspirin 81 MG tablet Take 81 mg by mouth daily.   Yes Historical Provider, MD  carvedilol (COREG) 3.125 MG tablet Take 3.125 mg by mouth 2 (two) times daily with a meal.   Yes Historical Provider, MD  gabapentin (NEURONTIN) 100 MG capsule Take 1 capsule by mouth 3 (three) times daily. 01/07/15  Yes Historical Provider, MD  LINZESS 145 MCG CAPS capsule Take 1 capsule  by mouth 2 (two) times a week. 02/13/15  Yes Historical Provider, MD  losartan-hydrochlorothiazide (HYZAAR) 100-12.5 MG per tablet Take 1 tablet by mouth daily.   Yes Historical Provider, MD  rOPINIRole (REQUIP) 0.5 MG tablet Take 0.5 mg by mouth 3 (three) times daily.   Yes Historical Provider, MD   Dg Chest 1 View  02/27/2015  CLINICAL DATA:  Preoperative examination (hip fracture) EXAM: CHEST 1 VIEW COMPARISON:  None. FINDINGS: Borderline enlarged cardiac silhouette and mediastinal contours with tortuosity and possible mild ectasia of the thoracic aorta. Atherosclerotic plaque within the thoracic aorta. Partially calcified bilateral hilar lymph nodes, the sequela of prior granulomatous infection. There is mild diffuse slightly nodular thickening of the pulmonary station. Minimal left basilar linear heterogeneous opacities favored to represent atelectasis. No pleural effusion or pneumothorax. No definite evidence of edema. No definite acute osseous abnormalities. IMPRESSION: 1. Cardiomegaly and atherosclerosis without definite acute cardiopulmonary disease. 2. Partially calcified bilateral hilar lymph nodes, the sequela of prior granulomatous infection. Electronically Signed   By: Sandi Mariscal M.D.   On: 02/27/2015 09:03   Dg Hip Unilat With Pelvis 2-3 Views Left  02/27/2015  CLINICAL DATA:  Fall at 2:30 this morning.  Unable to move leg. EXAM: DG HIP (WITH OR WITHOUT  PELVIS) 2-3V LEFT COMPARISON:  None. FINDINGS: Acute left intertrochanteric fracture with mild varus angulation. Mild degenerative spurring of the acetabula bilaterally. No other fractures of the bony pelvis identified. IMPRESSION: 1. Acute left intertrochanteric fracture with mild varus angulation. Electronically Signed   By: Van Clines M.D.   On: 02/27/2015 09:03    Positive ROS: All other systems have been reviewed and were otherwise negative with the exception of those mentioned in the HPI and as above.  Physical  Exam: General: Alert and alert in no acute distress. HEENT: Atraumatic and normocephalic. Sclera are clear. Extraocular motion is intact. Oropharynx is clear with moist mucosa. Neck: Supple, nontender, good range of motion. No JVD or carotid bruits. Lungs: Clear to auscultation bilaterally. Cardiovascular: Regular rate and rhythm with normal S1 and S2. No murmurs. No gallops or rubs. Pedal pulses are palpable bilaterally. Homans test is negative bilaterally. No significant pretibial or ankle edema. Abdomen: Soft, nontender, and nondistended. Bowel sounds are present. Skin: No lesions in the area of chief complaint Neurologic: Awake, alert, and oriented. Sensory function is grossly intact. Motor strength is felt to be 5 over 5 bilaterally. No clonus or tremor. Good motor coordination. Lymphatic: No axillary or cervical lymphadenopathy  MUSCULOSKELETAL: Pertinent musculoskeletal exam involves the left lower extremity. There is pain to palpation and attempted range of motion of the left hip. No gross swelling or ecchymosis is appreciated to the left hip or thigh. No knee effusion. No tenderness to palpation about the left foot or ankle. He left lower extremity is shortened and rotated.  Assessment: Displaced left intertrochanteric femur fracture  Plan: Recommendations are made for open reduction and internal fixation of the left intertrochanteric femur fracture.  The findings were discussed in detail with the patient. The usual perioperative course was discussed. The risks and benefits of surgical intervention were reviewed. The patient expressed understanding of the risks and benefits and agreed with plans for surgical intervention.  The surgical site was signed as per the "right site surgery" protocol.   James P. Holley Bouche M.D.

## 2015-02-27 NOTE — ED Provider Notes (Signed)
[facility] Emergency Department Provider Note     Time seen: ----------------------------------------- 8:16 AM on 02/27/2015 -----------------------------------------    I have reviewed the triage vital signs and the nursing notes.   HISTORY  Chief Complaint No chief complaint on file.    HPI Kristine Jacobson is a 81 y.o. female who presents to ER being brought by EMS for a fall. Patient fell at home when she tripped on the leg of her recliner. She landed on her left side, complains of severe left hip pain and inability to move her left leg very well. When she is lying still seems to help her symptoms, any movement of the left hip causes excruciating pain. She denies any other complaints from the fall.   Past Medical History  Diagnosis Date  . Arthritis   . Restless leg   . Overactive bladder   . Macular degeneration, age related   . HBP (high blood pressure)     There are no active problems to display for this patient.   Past Surgical History  Procedure Laterality Date  . Abdominal hysterectomy    . Tonsillectomy    . Bladder surgery    . Blocked intestine      Allergies Codeine  Social History Social History  Substance Use Topics  . Smoking status: Never Smoker   . Smokeless tobacco: Not on file  . Alcohol Use: No    Review of Systems Constitutional: Negative for fever. Eyes: Negative for visual changes. ENT: Negative for sore throat. Cardiovascular: Negative for chest pain. Respiratory: Negative for shortness of breath. Gastrointestinal: Negative for abdominal pain, vomiting and diarrhea. Genitourinary: Negative for dysuria. Musculoskeletal: Positive for left hip pain Skin: Negative for rash. Neurological: Negative for headaches, focal weakness or numbness.  10-point ROS otherwise negative.  ____________________________________________   PHYSICAL EXAM:  VITAL SIGNS: ED Triage Vitals  Enc Vitals Group     BP --    Pulse --      Resp --      Temp --      Temp src --      SpO2 --      Weight --      Height --      Head Cir --      Peak Flow --      Pain Score --      Pain Loc --      Pain Edu? --      Excl. in [place]? --     Constitutional: Alert and oriented. Well appearing and in no distress. Eyes: Conjunctivae are normal. PERRL. Normal extraocular movements. ENT   Head: Normocephalic and atraumatic.   Nose: No congestion/rhinnorhea.   Mouth/Throat: Mucous membranes are moist.   Neck: No stridor. Cardiovascular: Normal rate, regular rhythm. Normal and symmetric distal pulses are present in all extremities. No murmurs, rubs, or gallops. Respiratory: Normal respiratory effort without tachypnea nor retractions. Breath sounds are clear and equal bilaterally. No wheezes/rales/rhonchi. Gastrointestinal: Soft and nontender. No distention. No abdominal bruits.  Musculoskeletal: Left lower extremity is shortened and actually rotated. Severe pain range of motion left hip, good distal pulses. Neurologic:  Normal speech and language. No gross focal neurologic deficits are appreciated. Speech is normal Skin:  Skin is warm, dry and intact. No rash noted. Psychiatric: Mood and affect are normal. Speech and behavior are normal. Patient exhibits appropriate insight and judgment. ____________________________________________  EKG: Interpreted by me. Normal sinus rhythm with a rate of 89 bpm, normal  PR interval, normal QS with, normal QT interval.  ____________________________________________  ED COURSE:  Pertinent labs & imaging results that were available during my care of the patient were reviewed by me and considered in my medical decision making (see chart for details). Patient likely with hip fracture, will obtain basic labs, EKG, chest x-ray and left hip films. ____________________________________________    LABS (pertinent positives/negatives)  Labs Reviewed  CBC WITH  DIFFERENTIAL/PLATELET - Abnormal; Notable for the following:    WBC 11.2 (*)    Neutro Abs 9.3 (*)    All other components within normal limits  COMPREHENSIVE METABOLIC PANEL - Abnormal; Notable for the following:    Glucose, Bld 147 (*)    BUN 21 (*)    All other components within normal limits  TROPONIN I  URINALYSIS COMPLETEWITH MICROSCOPIC ([facility] ONLY)                           BUN 21 (*)    All other components within normal limits  TROPONIN I  URINALYSIS COMPLETEWITH MICROSCOPIC ([facility] ONLY)    RADIOLOGY Images were viewed by me IMPRESSION: 1. Cardiomegaly and atherosclerosis without definite acute cardiopulmonary disease. 2. Partially calcified bilateral hilar lymph nodes, the sequela of prior granulomatous infection.  Chest x-ray, left hip x-rays IMPRESSION: 1. Acute left intertrochanteric fracture with mild varus angulation. ____________________________________________  FINAL ASSESSMENT AND PLAN  Acute left intertrochanteric hip fracture  Plan: Patient with labs and imaging as dictated above. Patient with hip fracture status post fall. Will discuss with orthopedics in hospitalist for admission.   Earleen Newport, MD   Earleen Newport, MD 02/27/15 [id number]

## 2015-02-27 NOTE — Anesthesia Preprocedure Evaluation (Signed)
Anesthesia Evaluation  Patient identified by MRN, date of birth, ID band Patient awake    Reviewed: Allergy & Precautions, H&P , NPO status , Patient's Chart, lab work & pertinent test results, reviewed documented beta blocker date and time   Airway Mallampati: II  TM Distance: >3 FB Neck ROM: full    Dental no notable dental hx.    Pulmonary neg pulmonary ROS,    Pulmonary exam normal breath sounds clear to auscultation       Cardiovascular Exercise Tolerance: Good hypertension, negative cardio ROS   Rhythm:regular Rate:Normal     Neuro/Psych  Neuromuscular disease negative neurological ROS  negative psych ROS   GI/Hepatic negative GI ROS, Neg liver ROS,   Endo/Other  negative endocrine ROS  Renal/GU negative Renal ROS  negative genitourinary   Musculoskeletal   Abdominal   Peds  Hematology negative hematology ROS (+)   Anesthesia Other Findings   Reproductive/Obstetrics negative OB ROS                             Anesthesia Physical Anesthesia Plan  ASA: III and emergent  Anesthesia Plan: Spinal   Post-op Pain Management:    Induction:   Airway Management Planned:   Additional Equipment:   Intra-op Plan:   Post-operative Plan:   Informed Consent: I have reviewed the patients History and Physical, chart, labs and discussed the procedure including the risks, benefits and alternatives for the proposed anesthesia with the patient or authorized representative who has indicated his/her understanding and acceptance.   Dental Advisory Given  Plan Discussed with: CRNA  Anesthesia Plan Comments:         Anesthesia Quick Evaluation

## 2015-02-28 ENCOUNTER — Encounter
Admission: RE | Admit: 2015-02-28 | Discharge: 2015-02-28 | Disposition: A | Discharge status: Home / Self Care | Payer: Medicare Other | Source: Ambulatory Visit | Attending: Internal Medicine | Admitting: Internal Medicine

## 2015-02-28 ENCOUNTER — Encounter: Payer: Self-pay | Admitting: Orthopedic Surgery

## 2015-02-28 DIAGNOSIS — D649 Anemia, unspecified: Secondary | ICD-10-CM | POA: Insufficient documentation

## 2015-02-28 DIAGNOSIS — E871 Hypo-osmolality and hyponatremia: Secondary | ICD-10-CM | POA: Insufficient documentation

## 2015-02-28 LAB — BASIC METABOLIC PANEL
Anion gap: 6 (ref 5–15)
BUN: 16 mg/dL (ref 6–20)
CALCIUM: 7.9 mg/dL — AB (ref 8.9–10.3)
CO2: 25 mmol/L (ref 22–32)
CREATININE: 0.78 mg/dL (ref 0.44–1.00)
Chloride: 102 mmol/L (ref 101–111)
GFR calc Af Amer: >60 mL/min (ref >60)
GLUCOSE: 121 mg/dL — AB (ref 65–99)
POTASSIUM: 3.6 mmol/L (ref 3.5–5.1)
Sodium: 133 mmol/L — ABNORMAL LOW (ref 135–145)

## 2015-02-28 LAB — CBC
HEMATOCRIT: 31.9 % — AB (ref 35.0–47.0)
Hemoglobin: 10.7 g/dL — ABNORMAL LOW (ref 12.0–16.0)
MCH: 30.1 pg (ref 26.0–34.0)
MCHC: 33.4 g/dL (ref 32.0–36.0)
MCV: 90.1 fL (ref 80.0–100.0)
PLATELETS: 134 10*3/uL — AB (ref 150–440)
RBC: 3.54 MIL/uL — ABNORMAL LOW (ref 3.80–5.20)
RDW: 13.8 % (ref 11.5–14.5)
WBC: 7.2 10*3/uL (ref 3.6–11.0)

## 2015-02-28 LAB — ABO/RH: ABO/RH(D): A POS

## 2015-02-28 MED ORDER — PANTOPRAZOLE SODIUM 40 MG PO TBEC
40.0000 mg | DELAYED_RELEASE_TABLET | Freq: Two times a day (BID) | ORAL | Status: DC
  Administered 2015-02-28 – 2015-03-05 (×12): 40 mg via ORAL
  Filled 2015-02-28 (×12): qty 1

## 2015-02-28 MED ORDER — TRAMADOL HCL 50 MG PO TABS
50.0000 mg | ORAL_TABLET | ORAL | Status: DC | PRN
  Administered 2015-02-28 – 2015-03-04 (×8): 50 mg via ORAL
  Administered 2015-03-05: 100 mg via ORAL
  Filled 2015-02-28 (×8): qty 1
  Filled 2015-02-28: qty 2

## 2015-02-28 MED ORDER — ACETAMINOPHEN 325 MG PO TABS
650.0000 mg | ORAL_TABLET | Freq: Four times a day (QID) | ORAL | Status: DC | PRN
  Administered 2015-03-02 – 2015-03-03 (×2): 650 mg via ORAL
  Filled 2015-02-28 (×2): qty 2

## 2015-02-28 MED ORDER — BISACODYL 10 MG RE SUPP
10.0000 mg | Freq: Every day | RECTAL | Status: DC | PRN
  Administered 2015-03-02 – 2015-03-05 (×2): 10 mg via RECTAL
  Filled 2015-02-28 (×2): qty 1

## 2015-02-28 MED ORDER — PHENOL 1.4 % MT LIQD: 1.0000 | OROMUCOSAL | Status: DC | PRN

## 2015-02-28 MED ORDER — MENTHOL 3 MG MT LOZG
1.0000 | LOZENGE | OROMUCOSAL | Status: DC | PRN
  Filled 2015-02-28: qty 9

## 2015-02-28 MED ORDER — ACETAMINOPHEN 650 MG RE SUPP: 650.0000 mg | Freq: Four times a day (QID) | RECTAL | Status: DC | PRN

## 2015-02-28 MED ORDER — FERROUS SULFATE 325 (65 FE) MG PO TABS
325.0000 mg | ORAL_TABLET | Freq: Two times a day (BID) | ORAL | Status: DC
  Administered 2015-02-28 – 2015-03-05 (×12): 325 mg via ORAL
  Filled 2015-02-28 (×12): qty 1

## 2015-02-28 MED ORDER — ONDANSETRON HCL 4 MG/2ML IJ SOLN: 4.0000 mg | Freq: Four times a day (QID) | INTRAMUSCULAR | Status: DC | PRN

## 2015-02-28 MED ORDER — OXYCODONE HCL 5 MG PO TABS
5.0000 mg | ORAL_TABLET | ORAL | Status: DC | PRN
  Administered 2015-02-28 – 2015-03-01 (×4): 5 mg via ORAL
  Administered 2015-03-05: 10 mg via ORAL
  Administered 2015-03-05: 5 mg via ORAL
  Filled 2015-02-28: qty 1
  Filled 2015-02-28: qty 2
  Filled 2015-02-28 (×3): qty 1
  Filled 2015-02-28: qty 2
  Filled 2015-02-28: qty 1

## 2015-02-28 MED ORDER — CETYLPYRIDINIUM CHLORIDE 0.05 % MT LIQD
7.0000 mL | Freq: Two times a day (BID) | OROMUCOSAL | Status: DC
  Administered 2015-02-28 – 2015-03-05 (×11): 7 mL via OROMUCOSAL

## 2015-02-28 MED ORDER — METOCLOPRAMIDE HCL 10 MG PO TABS
10.0000 mg | ORAL_TABLET | Freq: Three times a day (TID) | ORAL | Status: AC
  Administered 2015-02-28 – 2015-03-01 (×8): 10 mg via ORAL
  Filled 2015-02-28 (×8): qty 1

## 2015-02-28 MED ORDER — FLEET ENEMA 7-19 GM/118ML RE ENEM: 1.0000 | ENEMA | Freq: Once | RECTAL | Status: DC | PRN

## 2015-02-28 MED ORDER — SODIUM CHLORIDE 0.9 % IV SOLN
INTRAVENOUS | Status: DC
  Administered 2015-02-28: 01:00:00 via INTRAVENOUS

## 2015-02-28 MED ORDER — METOCLOPRAMIDE HCL 5 MG/ML IJ SOLN: 5.0000 mg | Freq: Three times a day (TID) | INTRAMUSCULAR | Status: DC | PRN

## 2015-02-28 MED ORDER — CEFAZOLIN SODIUM-DEXTROSE 2-3 GM-% IV SOLR
2.0000 g | Freq: Four times a day (QID) | INTRAVENOUS | Status: AC
  Administered 2015-02-28 (×4): 2 g via INTRAVENOUS
  Filled 2015-02-28 (×4): qty 50

## 2015-02-28 MED ORDER — ONDANSETRON HCL 4 MG PO TABS: 4.0000 mg | ORAL_TABLET | Freq: Four times a day (QID) | ORAL | Status: DC | PRN

## 2015-02-28 MED ORDER — ACETAMINOPHEN 10 MG/ML IV SOLN
1000.0000 mg | Freq: Four times a day (QID) | INTRAVENOUS | Status: AC
  Administered 2015-02-28 (×3): 1000 mg via INTRAVENOUS
  Filled 2015-02-28 (×4): qty 100

## 2015-02-28 MED ORDER — ENOXAPARIN SODIUM 30 MG/0.3ML ~~LOC~~ SOLN
30.0000 mg | SUBCUTANEOUS | Status: DC
  Administered 2015-02-28 – 2015-03-01 (×2): 30 mg via SUBCUTANEOUS
  Filled 2015-02-28 (×2): qty 0.3

## 2015-02-28 MED ORDER — METOCLOPRAMIDE HCL 5 MG PO TABS: 5.0000 mg | ORAL_TABLET | Freq: Three times a day (TID) | ORAL | Status: DC | PRN

## 2015-02-28 MED ORDER — SENNOSIDES-DOCUSATE SODIUM 8.6-50 MG PO TABS
1.0000 | ORAL_TABLET | Freq: Two times a day (BID) | ORAL | Status: DC
  Administered 2015-02-28 – 2015-03-05 (×11): 1 via ORAL
  Filled 2015-02-28 (×11): qty 1

## 2015-02-28 MED ORDER — MAGNESIUM HYDROXIDE 400 MG/5ML PO SUSP
30.0000 mL | Freq: Every day | ORAL | Status: DC | PRN
  Administered 2015-02-28 – 2015-03-05 (×2): 30 mL via ORAL
  Filled 2015-02-28 (×2): qty 30

## 2015-02-28 MED ORDER — MORPHINE SULFATE (PF) 2 MG/ML IV SOLN: 2.0000 mg | INTRAVENOUS | Status: DC | PRN

## 2015-02-28 NOTE — Progress Notes (Signed)
[facility] Medicare authorization received for SNF. Auth # [id number] RVB. Kim admissions coordinator at [facility] is aware of above. Patient is aware of above.   Ernest Pine, [place]  [phone]

## 2015-02-28 NOTE — Anesthesia Postprocedure Evaluation (Signed)
Anesthesia Post Note  Patient: Kristine Jacobson  Procedure(s) Performed: Procedure(s) (LRB): INTRAMEDULLARY (IM) NAIL INTERTROCHANTRIC (Left)  Patient location during evaluation: ICU Anesthesia Type: General Level of consciousness: awake and alert Pain management: pain level controlled Vital Signs Assessment: post-procedure vital signs reviewed and stable Respiratory status: spontaneous breathing Cardiovascular status: blood pressure returned to baseline Postop Assessment: no headache Anesthetic complications: no    Last Vitals:  Filed Vitals:   02/28/15 0538 02/28/15 0749  BP: 137/75 122/58  Pulse: 75 72  Temp: 36.8 C 36.9 C  Resp: 18 16    Last Pain:  Filed Vitals:   02/28/15 0759  PainSc: 4                  Bernardo Heater

## 2015-02-28 NOTE — Evaluation (Signed)
Physical Therapy Evaluation Patient Details Name: Kristine Jacobson MRN: [id number] DOB: 12-18-1923 Today's Date: 02/28/2015   History of Present Illness  This patient is an 81 year old female who came to [facility] after a fall an suffering a left femur fracture. She recieved an ORIF repair.  Clinical Impression  Pt demonstrates weakness and pain with bed mobility, transfers, and limited ambulation. She is only able to transfer from bed to recliner and requires assist advancing LLE due to weakness. Pt with poor weight acceptance to LLE due to weakness and pain. Pt lives alone and is severely limited at this time. She will need SNF placement to return to prior level of strength. Pt will benefit from skilled PT services to address deficits in strength, balance, and mobility in order to return to full function at home.     Follow Up Recommendations SNF    Equipment Recommendations   (TBD at facility)    Recommendations for Other Services       Precautions / Restrictions Precautions Precautions: Fall Restrictions Weight Bearing Restrictions: Yes LLE Weight Bearing: Weight bearing as tolerated      Mobility  Bed Mobility Overal bed mobility: Needs Assistance Bed Mobility: Supine to Sit     Supine to sit: Mod assist     General bed mobility comments: Pt requires assist for adduction of LLE as well as trunk right at EOB. Assist required to scoot forward to EOB  Transfers Overall transfer level: Needs assistance   Transfers: Sit to/from Stand Sit to Stand: Mod assist;+2 physical assistance         General transfer comment: Pt with poor LLE strength and weight acceptance. Cues provided for safe hand placement during transfer  Ambulation/Gait Ambulation/Gait assistance: Mod assist;+2 physical assistance Ambulation Distance (Feet): 3 Feet Assistive device: Rolling walker (2 wheeled) Gait Pattern/deviations: Step-to pattern;Decreased stance time - left;Decreased weight shift to  left;Antalgic Gait velocity: Decreased Gait velocity interpretation: <1.8 ft/sec, indicative of risk for recurrent falls General Gait Details: Pt requires assist with advancing LLE during ambulation due to weakness. Poor weight shifting to LLE with buckling noted. Heavy UE use and close guarding +2 due to LLE buckling and weakness  Stairs            Wheelchair Mobility    Modified Rankin (Stroke Patients Only)       Balance Overall balance assessment: Needs assistance   Sitting balance-Leahy Scale: Good       Standing balance-Leahy Scale: Poor                               Pertinent Vitals/Pain Pain Assessment: No/denies pain (until moved)    Home Living Family/patient expects to be discharged to:: Private residence Living Arrangements: Alone Available Help at Discharge: Family Type of Home: House Home Access: Stairs to enter Entrance Stairs-Rails: Left Entrance Stairs-Number of Steps: 1 Home Layout: One level Home Equipment: Toilet riser;Walker - 2 wheels;Cane - single point;Bedside commode      Prior Function Level of Independence: Independent         Comments: Full community ambulator without assistive device. Drives and independent with ADLs/IADLs     Hand Dominance   Dominant Hand: Right    Extremity/Trunk Assessment   Upper Extremity Assessment: Overall WFL for tasks assessed           Lower Extremity Assessment: Defer to PT evaluation   LLE Deficits / Details: Pt unable to  perform active L hip abduction or SLR without assist. Full DF/PF. Denies numbness/tingling in LLE     Communication   Communication: No difficulties  Cognition Arousal/Alertness: Awake/alert Behavior During Therapy: WFL for tasks assessed/performed Overall Cognitive Status: Within Functional Limits for tasks assessed                      General Comments      Exercises General Exercises - Lower Extremity Ankle Circles/Pumps:  Strengthening;Both;15 reps;Supine Quad Sets: Strengthening;Both;15 reps;Supine Gluteal Sets: Strengthening;Both;15 reps;Supine Short Arc Quad: Strengthening;Left;15 reps;Supine Heel Slides: Strengthening;Left;15 reps;Supine Hip ABduction/ADduction: Strengthening;Left;15 reps;Supine Straight Leg Raises: Strengthening;Left;15 reps;Supine      Assessment/Plan    PT Assessment Patient needs continued PT services  PT Diagnosis Difficulty walking;Abnormality of gait;Generalized weakness;Acute pain   PT Problem List Decreased strength;Decreased range of motion;Decreased activity tolerance;Decreased balance;Decreased mobility;Decreased knowledge of use of DME;Decreased safety awareness;Pain  PT Treatment Interventions DME instruction;Gait training;Functional mobility training;Therapeutic activities;Therapeutic exercise;Stair training;Balance training;Neuromuscular re-education;Patient/family education   PT Goals (Current goals can be found in the Care Plan section) Acute Rehab PT Goals Patient Stated Goal: would like to go home but thinks it may be a good idea to go to rehab PT Goal Formulation: With patient Time For Goal Achievement: 03/14/15 Potential to Achieve Goals: Good    Frequency BID   Barriers to discharge Decreased caregiver support Pt lives alone. Family out of town    Co-evaluation               End of Session Equipment Utilized During Treatment: Gait belt Activity Tolerance: Patient limited by pain Patient left: in chair;with call bell/phone within reach;with chair alarm set;with SCD's reapplied Nurse Communication: Mobility status         Time: [phone] PT Time Calculation (min) (ACUTE ONLY): 30 min   Charges:   PT Evaluation $Initial PT Evaluation Tier I: 1 Procedure PT Treatments $Therapeutic Exercise: 8-22 mins   PT G Codes:       Lyndel Safe Huprich PT, DPT   Huprich,Jason 02/28/2015, 10:53 AM

## 2015-02-28 NOTE — Progress Notes (Signed)
Patient has voided on her own since foley catheter has been removed.

## 2015-02-28 NOTE — Clinical Social Work Note (Signed)
Clinical Social Work Assessment  Patient Details  Name: Kristine Jacobson MRN: [id number] Date of Birth: 07-03-1923  Date of referral:  02/28/15               Reason for consult:  Facility Placement                Permission sought to share information with:  Chartered certified accountant granted to share information::  Yes, Verbal Permission Granted  Name::      [place]::   [place]   Relationship::     Contact Information:     Housing/Transportation Living arrangements for the past 2 months:  [place] of Information:  Patient, Adult Children Patient Interpreter Needed:  None Criminal Activity/Legal Involvement Pertinent to Current Situation/Hospitalization:  No - Comment as needed Significant Relationships:  Adult Children, Other Family Members Lives with:  Self Do you feel safe going back to the place where you live?  Yes Need for family participation in patient care:  Yes (Comment)  Care giving concerns: Patient lives alone in [place].    Social Worker assessment / plan: [place] representative (CSW) received SNF consult. PT is recommending SNF. CSW met with patient to discuss D/C plan. Patient was sitting up in the chair and was alert and oriented. CSW introduced self and explained role of CSW department. Patient reported that she lives alone in [place]. Per patient she has 2 daughters [name] ([place]) and Jackelyn Poling. Per patient her daughter [name] lives in [place] however she is in the hospital for an emergency appendix surgery. CSW explained that PT is recommend SNF and patient's insurance [facility] Medicare requires authorization. Patient is agreeable to SNF search and prefers [facility]   FL2 complete and faxed out. CSW will start [facility] authorization when PT evaluation is available.   Employment status:  Retired Nurse, adult PT Recommendations:  [place] / Referral to community resources:  [place]  Patient/Family's Response to care:  Patient is agreeable to [employer] and prefers [employer]   Patient/Family's Understanding of and Emotional Response to Diagnosis, Current Treatment, and Prognosis:  Patient was pleasant and thanked CSW for visit.   Emotional Assessment Appearance:  Appears stated age Attitude/Demeanor/Rapport:    Affect (typically observed):  Accepting, Adaptable, Pleasant Orientation:  Oriented to Self, Oriented to Place, Oriented to  Time, Oriented to Situation Alcohol / Substance use:  Not Applicable Psych involvement (Current and /or in the community):  No (Comment)  Discharge Needs  Concerns to be addressed:  Discharge Planning Concerns Readmission within the last 30 days:  No Current discharge risk:  Dependent with Mobility Barriers to Discharge:  Continued Medical Work up   Loralyn Freshwater, LCSW 02/28/2015, 11:08 AM

## 2015-02-28 NOTE — NC FL2 (Signed)
[place] LEVEL OF CARE SCREENING TOOL     IDENTIFICATION  Patient Name: Kristine Jacobson Birthdate: 1923/08/25 Sex: female Admission Date (Current Location): 02/27/2015  [place] and [place] Number:  [facility] )   Facility and Address:  [facility], [address] [place], [place] [address]      Provider Number: [id number]  Attending Physician Name and Address:  Epifanio Lesches, MD  Relative Name and Phone Number:       Current Level of Care: Hospital Recommended Level of Care: [place] Prior Approval Number:    Date Approved/Denied:   PASRR Number:  ([id number])  Discharge Plan: SNF    Current Diagnoses: Patient Active Problem List   Diagnosis Date Noted  . Closed left hip fracture ([place]) 02/27/2015    Orientation RESPIRATION BLADDER Height & Weight    Self, Time, Situation, Place  O2 (2 Liters Oxygen ) Continent 5\' 7"  (170.2 cm) 150 lbs.  BEHAVIORAL SYMPTOMS/MOOD NEUROLOGICAL BOWEL NUTRITION STATUS   (none )  (none ) Continent Regular diet  AMBULATORY STATUS COMMUNICATION OF NEEDS Skin   Extensive Assist Verbally Surgical wounds (Incision: Left Hip. )                       Personal Care Assistance Level of Assistance  Bathing, Feeding, Dressing Bathing Assistance: Limited assistance Feeding assistance: Independent Dressing Assistance: Limited assistance     Functional Limitations Info  Sight, Hearing, Speech Sight Info: Adequate Hearing Info: Impaired Speech Info: Adequate    SPECIAL CARE FACTORS FREQUENCY  PT (By licensed PT), OT (By licensed OT)     PT Frequency:  (5) OT Frequency:  (5)            Contractures      Additional Factors Info  Code Status, Allergies Code Status Info:  (Full Code. ) Allergies Info:  (Codeine)           Current Medications (02/28/2015):  This is the current hospital active medication list Current Facility-Administered Medications   Medication Dose Route Frequency Provider Last Rate Last Dose  . 0.9 %  sodium chloride infusion   Intravenous Continuous Epifanio Lesches, MD 75 mL/hr at 02/27/15 1212    . 0.9 %  sodium chloride infusion   Intravenous Continuous Dereck Leep, MD 100 mL/hr at 02/28/15 0057    . acetaminophen (OFIRMEV) IV 1,000 mg  1,000 mg Intravenous 4 times per day Dereck Leep, MD   1,000 mg at 02/28/15 0538  . acetaminophen (TYLENOL) tablet 650 mg  650 mg Oral Q6H PRN Dereck Leep, MD       Or  . acetaminophen (TYLENOL) suppository 650 mg  650 mg Rectal Q6H PRN Dereck Leep, MD      . amLODipine (NORVASC) tablet 5 mg  5 mg Oral Daily Epifanio Lesches, MD   5 mg at 02/28/15 0751  . antiseptic oral rinse (CPC / CETYLPYRIDINIUM CHLORIDE 0.05%) solution 7 mL  7 mL Mouth Rinse BID Epifanio Lesches, MD   7 mL at 02/28/15 1000  . aspirin chewable tablet 81 mg  81 mg Oral Daily Epifanio Lesches, MD   81 mg at 02/28/15 0753  . bisacodyl (DULCOLAX) suppository 10 mg  10 mg Rectal Daily PRN Dereck Leep, MD      . carvedilol (COREG) tablet 3.125 mg  3.125 mg Oral BID WC Epifanio Lesches, MD   3.125 mg at 02/28/15 0754  . ceFAZolin (ANCEF)  IVPB 2 g/50 mL premix  2 g Intravenous Q6H Dereck Leep, MD   2 g at 02/28/15 0539  . enoxaparin (LOVENOX) injection 30 mg  30 mg Subcutaneous Q24H Dereck Leep, MD   30 mg at 02/28/15 0755  . ferrous sulfate tablet 325 mg  325 mg Oral BID WC Dereck Leep, MD   325 mg at 02/28/15 0800  . gabapentin (NEURONTIN) capsule 100 mg  100 mg Oral TID Epifanio Lesches, MD   100 mg at 02/28/15 0752  . losartan (COZAAR) tablet 100 mg  100 mg Oral Daily Epifanio Lesches, MD   100 mg at 02/28/15 0753   And  . hydrochlorothiazide (MICROZIDE) capsule 12.5 mg  12.5 mg Oral Daily Epifanio Lesches, MD   12.5 mg at 02/28/15 0751  . Linaclotide (LINZESS) capsule 145 mcg  145 mcg Oral Once per day on Mon Thu Snehalatha Konidena, MD   145 mcg at 02/28/15 0754  .  magnesium hydroxide (MILK OF MAGNESIA) suspension 30 mL  30 mL Oral Daily PRN Dereck Leep, MD      . menthol-cetylpyridinium (CEPACOL) lozenge 3 mg  1 lozenge Oral PRN Dereck Leep, MD       Or  . phenol (CHLORASEPTIC) mouth spray 1 spray  1 spray Mouth/Throat PRN Dereck Leep, MD      . metoCLOPramide (REGLAN) tablet 5-10 mg  5-10 mg Oral Q8H PRN Dereck Leep, MD       Or  . metoCLOPramide (REGLAN) injection 5-10 mg  5-10 mg Intravenous Q8H PRN Dereck Leep, MD      . metoCLOPramide (REGLAN) tablet 10 mg  10 mg Oral TID AC & HS Dereck Leep, MD   10 mg at 02/28/15 0754  . morphine 2 MG/ML injection 2-4 mg  2-4 mg Intravenous Q4H PRN Dereck Leep, MD      . ondansetron (ZOFRAN) tablet 4 mg  4 mg Oral Q6H PRN Dereck Leep, MD       Or  . ondansetron (ZOFRAN) injection 4 mg  4 mg Intravenous Q6H PRN Dereck Leep, MD      . oxyCODONE (Oxy IR/ROXICODONE) immediate release tablet 5-10 mg  5-10 mg Oral Q4H PRN Dereck Leep, MD   5 mg at 02/28/15 0813  . pantoprazole (PROTONIX) EC tablet 40 mg  40 mg Oral BID Dereck Leep, MD   40 mg at 02/28/15 0754  . rOPINIRole (REQUIP) tablet 0.5 mg  0.5 mg Oral TID Epifanio Lesches, MD   0.5 mg at 02/28/15 0752  . senna-docusate (Senokot-S) tablet 1 tablet  1 tablet Oral BID Dereck Leep, MD   1 tablet at 02/28/15 [phone]  . sodium phosphate (FLEET) 7-19 GM/118ML enema 1 enema  1 enema Rectal Once PRN Dereck Leep, MD      . traMADol Veatrice Bourbon) tablet 50-100 mg  50-100 mg Oral Q4H PRN Dereck Leep, MD   50 mg at 02/28/15 [id number]     Discharge Medications: Please see discharge summary for a list of discharge medications.  Relevant Imaging Results:  Relevant Lab Results:   Additional Information  (SSN: [id number])  Loralyn Freshwater, LCSW

## 2015-02-28 NOTE — Clinical Social Work Placement (Signed)
   CLINICAL SOCIAL WORK PLACEMENT  NOTE  Date:  02/28/2015  Patient Details  Name: Kristine Jacobson MRN: [id number] Date of Birth: [birth year]  Clinical Social Work is seeking post-discharge placement for this patient at the [place] level of care (*CSW will initial, date and re-position this form in  chart as items are completed):  Yes   Patient/family provided with [facility] Work Department's list of facilities offering this level of care within the geographic area requested by the patient (or if unable, by the patient's family).  Yes   Patient/family informed of their freedom to choose among providers that offer the needed level of care, that participate in Medicare, Medicaid or managed care program needed by the patient, have an available bed and are willing to accept the patient.  Yes   Patient/family informed of [facility]'s ownership interest in [facility] and [facility], as well as of the fact that they are under no obligation to receive care at these facilities.  PASRR submitted to EDS on 02/28/15     PASRR number received on 02/28/15     Existing PASRR number confirmed on       FL2 transmitted to all facilities in geographic area requested by pt/family on 02/28/15     FL2 transmitted to all facilities within larger geographic area on       Patient informed that his/her managed care company has contracts with or will negotiate with certain facilities, including the following:            Patient/family informed of bed offers received.  Patient chooses bed at       Physician recommends and patient chooses bed at      Patient to be transferred to   on  .  Patient to be transferred to facility by       Patient family notified on   of transfer.  Name of family member notified:        PHYSICIAN       Additional Comment:    _______________________________________________ Loralyn Freshwater, LCSW 02/28/2015, 11:06 AM

## 2015-02-28 NOTE — Progress Notes (Signed)
Patient arrived from surgery 2350. POD 0 left hip. Patient alert and oriented. Skin CDI. IV fluids infusing. Pain controlled with PRN medications.

## 2015-02-28 NOTE — Evaluation (Signed)
Occupational Therapy Evaluation Patient Details Name: Kristine Jacobson MRN: [id number] DOB: 08/20/1923 Today's Date: 02/28/2015    History of Present Illness This patient is an 81 year old female who came to [facility] after a fall an suffering a left femur fracture. She recieved an ORIF repair.   Clinical Impression   This patient is an 80  year old female who came to [facility] after a fall suffering a L hip fracture. She received an open reduction with internal fixation repair. She hand been independent with ADL and functional mobility including driving. She now requires assist and would benefit from Occupational Therapy for ADL/functioal mobility training.     Follow Up Recommendations       Equipment Recommendations       Recommendations for Other Services       Precautions / Restrictions Precautions Precautions: Fall Restrictions Weight Bearing Restrictions: Yes LLE Weight Bearing: Weight bearing as tolerated      Mobility Bed Mobility                  Transfers                      Balance                                            ADL                                         General ADL Comments: Patient had been independent with ADL and mobility including driving. Patient with pain with movement. Instructed in assistive devices for lower body dressing.     Vision     Perception     Praxis      Pertinent Vitals/Pain Pain Assessment: No/denies pain (until moved)     Hand Dominance Right   Extremity/Trunk Assessment Upper Extremity Assessment Upper Extremity Assessment: Overall WFL for tasks assessed   Lower Extremity Assessment Lower Extremity Assessment: Defer to PT evaluation       Communication Communication Communication: No difficulties   Cognition Arousal/Alertness: Awake/alert Behavior During Therapy: WFL for tasks assessed/performed Overall Cognitive Status: Within  Functional Limits for tasks assessed                     General Comments       Exercises Exercises: General Lower Extremity     Shoulder Instructions      Home Living Family/patient expects to be discharged to:: Private residence Living Arrangements: Alone Available Help at Discharge: Family Type of Home: House Home Access: Stairs to enter Technical brewer of Steps: 1 Entrance Stairs-Rails: Left Home Layout: One level     Bathroom Shower/Tub: Teacher, early years/pre: Standard     Home Equipment: Toilet riser;Walker - 2 wheels;Cane - single point;Bedside commode          Prior Functioning/Environment Level of Independence: Independent        Comments: Full community ambulator without assistive device. Drives and independent with ADLs/IADLs    OT Diagnosis:     OT Problem List:     OT Treatment/Interventions:      OT Goals(Current goals can be found in the care plan section)    OT Frequency:  Barriers to D/C:            Co-evaluation              End of Session    Activity Tolerance:   Patient left:     Time:  -    Charges:    G-Codes:    Myrene Galas, MS/OTR/L  02/28/2015, 10:09 AM

## 2015-02-28 NOTE — Progress Notes (Signed)
   Subjective: 1 Day Post-Op Procedure(s) (LRB): INTRAMEDULLARY (IM) NAIL INTERTROCHANTRIC (Left) Patient reports pain as 5 on 0-10 scale.   Patient is well, and has had no acute complaints or problems We will start therapy today.  Plan is to go Rehab after hospital stay. no nausea and no vomiting Patient denies any chest pains or shortness of breath. Objective: Vital signs in last 24 hours: Temp:  [97.7 F (36.5 C)-99.6 F (37.6 C)] 98.2 F (36.8 C) (12/15 0538) Pulse Rate:  [70-102] 75 (12/15 0538) Resp:  [15-19] 18 (12/15 0538) BP: (134-191)/(47-85) 137/75 mmHg (12/15 0538) SpO2:  [91 %-100 %] 98 % (12/15 0538) Weight:  [68.04 kg (150 lb)] 68.04 kg (150 lb) (12/14 0819) well approximated incision Heels are non tender and elevated off the bed using rolled towels Intake/Output from previous day: 12/14 0701 - 12/15 0700 In: 1907.5 [P.O.:20; I.V.:1737.5; IV Piggyback:150] Out: [id number] [Urine:1570; Blood:250] Intake/Output this shift:     Recent Labs  02/27/15 0822 02/28/15 0556  HGB 12.9 10.7*    Recent Labs  02/27/15 0822 02/28/15 0556  WBC 11.2* 7.2  RBC 4.26 3.54*  HCT 38.3 31.9*  PLT 167 134*    Recent Labs  02/27/15 0822 02/28/15 0559  NA 137 133*  K 3.5 3.6  CL 103 102  CO2 25 25  BUN 21* 16  CREATININE 0.68 0.78  GLUCOSE 147* 121*  CALCIUM 9.3 7.9*   No results for input(s): LABPT, INR in the last 72 hours.  EXAM General - Patient is Alert, Appropriate and Oriented Extremity - Neurologically intact Neurovascular intact Sensation intact distally Intact pulses distally Dorsiflexion/Plantar flexion intact Dressing - dressing C/D/I Motor Function - intact, moving foot and toes well on exam.    Past Medical History  Diagnosis Date  . Arthritis   . Restless leg   . Overactive bladder   . Macular degeneration, age related   . HBP (high blood pressure)   . Spinal stenosis   . Osteoporosis     Assessment/Plan: 1 Day Post-Op Procedure(s)  (LRB): INTRAMEDULLARY (IM) NAIL INTERTROCHANTRIC (Left) Active Problems:   Closed left hip fracture (HCC)  Estimated body mass index is 23.49 kg/(m^2) as calculated from the following:   Height as of this encounter: 5\' 7"  (1.702 m).   Weight as of this encounter: 68.04 kg (150 lb). Advance diet Up with therapy D/C IV fluids Discharge to SNF  Labs: reviewed DVT Prophylaxis - Lovenox, Foot Pumps and TED hose Weight-Bearing as tolerated to left leg D/C O2 and Pulse OX and try on Room Air Am labs tomorrow  Jillyn Ledger. [place] 02/28/2015, 7:27 AM

## 2015-02-28 NOTE — Progress Notes (Signed)
Clinical Education officer, museum (CSW) presented bed offers to patient. She chose [facility]. Kim admissions coordinator at [facility] is aware of accepted offer. CSW faxed clinicals to [facility]. CSW will continue to follow and assist as needed.   Blima Rich, [place] [phone]

## 2015-02-28 NOTE — Progress Notes (Signed)
Foley catheter removed. Pt is DTV by 2220.

## 2015-02-28 NOTE — Progress Notes (Signed)
[place] [facility] [place] NAME: Kristine Jacobson    MR#:  [id number]  DATE OF BIRTH:  10-14-1923  SUBJECTIVE: 81 year old female patient admitted for left hip fracture status post surgery. Postoperatively she is doing better. Pain well controlled. Denies any other complaints.   CHIEF COMPLAINT:   Chief Complaint  Patient presents with  . Fall    REVIEW OF SYSTEMS:    Review of Systems  Constitutional: Negative for fever and chills.  HENT: Negative for hearing loss.   Eyes: Negative for blurred vision, double vision and photophobia.  Respiratory: Negative for cough, hemoptysis and shortness of breath.   Cardiovascular: Negative for palpitations, orthopnea and leg swelling.  Gastrointestinal: Negative for vomiting, abdominal pain and diarrhea.  Genitourinary: Negative for dysuria and urgency.  Musculoskeletal: Negative for myalgias and neck pain.       SLIGHT left hip pain. Well controlled with pain medicine.  Skin: Negative for rash.  Neurological: Negative for dizziness, focal weakness, seizures, weakness and headaches.  Psychiatric/Behavioral: Negative for memory loss. The patient does not have insomnia.     Nutrition:  Tolerating Diet: Tolerating PT:      DRUG ALLERGIES:   Allergies  Allergen Reactions  . Codeine Rash    VITALS:  Blood pressure 122/58, pulse 72, temperature 98.4 F (36.9 C), temperature source Oral, resp. rate 16, height 5\' 7"  (1.702 m), weight 68.04 kg (150 lb), SpO2 97 %.  PHYSICAL EXAMINATION:   Physical Exam  GENERAL:  81 y.o.-year-old patient lying in the bed with no acute distress.  EYES: Pupils equal, round, reactive to light and accommodation. No scleral icterus. Extraocular muscles intact.  HEENT: Head atraumatic, normocephalic. Oropharynx and nasopharynx clear.  NECK:  Supple, no jugular venous distention. No thyroid enlargement, no tenderness.  LUNGS: Normal breath sounds bilaterally, no  wheezing, rales,rhonchi or crepitation. No use of accessory muscles of respiration.  CARDIOVASCULAR: S1, S2 normal. No murmurs, rubs, or gallops.  ABDOMEN: Soft, nontender, nondistended. Bowel sounds present. No organomegaly or mass.  EXTREMITIES: No pedal edema, cyanosis, or clubbing.  NEUROLOGIC: Cranial nerves II through XII are intact. Muscle strength 5/5 in all extremities. Sensation intact. Gait not checked.  PSYCHIATRIC: The patient is alert and oriented x 3.  SKIN: No obvious rash, lesion, or ulcer.    LABORATORY PANEL:   CBC  Recent Labs Lab 02/28/15 0556  WBC 7.2  HGB 10.7*  HCT 31.9*  PLT 134*   ------------------------------------------------------------------------------------------------------------------  Chemistries   Recent Labs Lab 02/27/15 0822 02/28/15 0559  NA 137 133*  K 3.5 3.6  CL 103 102  CO2 25 25  GLUCOSE 147* 121*  BUN 21* 16  CREATININE 0.68 0.78  CALCIUM 9.3 7.9*  AST 34  --   ALT 26  --   ALKPHOS 66  --   BILITOT 0.8  --    ------------------------------------------------------------------------------------------------------------------  Cardiac Enzymes  Recent Labs Lab 02/27/15 0822  TROPONINI <0.03   ------------------------------------------------------------------------------------------------------------------  RADIOLOGY:  Dg Chest 1 View  02/27/2015  CLINICAL DATA:  Preoperative examination (hip fracture) EXAM: CHEST 1 VIEW COMPARISON:  None. FINDINGS: Borderline enlarged cardiac silhouette and mediastinal contours with tortuosity and possible mild ectasia of the thoracic aorta. Atherosclerotic plaque within the thoracic aorta. Partially calcified bilateral hilar lymph nodes, the sequela of prior granulomatous infection. There is mild diffuse slightly nodular thickening of the pulmonary station. Minimal left basilar linear heterogeneous opacities favored to represent atelectasis. No pleural effusion or pneumothorax. No  definite  evidence of edema. No definite acute osseous abnormalities. IMPRESSION: 1. Cardiomegaly and atherosclerosis without definite acute cardiopulmonary disease. 2. Partially calcified bilateral hilar lymph nodes, the sequela of prior granulomatous infection. Electronically Signed   By: Sandi Mariscal M.D.   On: 02/27/2015 09:03   Dg C-arm 1-60 Min  02/27/2015  CLINICAL DATA:  Left hip nailing in the OR. EXAM: OPERATIVE left HIP (WITH PELVIS IF PERFORMED) 4 VIEWS TECHNIQUE: Fluoroscopic spot image(s) were submitted for interpretation post-operatively. COMPARISON:  02/27/2015 FINDINGS: Intraoperative fluoroscopy is utilized for surgical control purposes demonstrating internal fixation of a left hip fracture. Fluoroscopy time is recorded at 1 minute 0 seconds. Cumulative area dose product is 146.36 cGy per cm square. Cumulative air kerma is 10.13 mGy. Four spot fluoroscopic images are obtained. Spot fluoroscopic images of the left hip obtained demonstrate internal fixation of an intertrochanteric fracture of the proximal left femur using intra medullary rod and compression bolt. There is a distal locking screw at the distal rod. Fracture fragments appear in near anatomic alignment and position. No dislocation of the hip is demonstrated. IMPRESSION: Intraoperative fluoroscopy is utilized for surgical control purposes demonstrating internal fixation of left hip fracture. Electronically Signed   By: Lucienne Capers M.D.   On: 02/27/2015 22:46   Dg Hip Operative Unilat With Pelvis Left  02/27/2015  CLINICAL DATA:  Left hip nailing in the OR. EXAM: OPERATIVE left HIP (WITH PELVIS IF PERFORMED) 4 VIEWS TECHNIQUE: Fluoroscopic spot image(s) were submitted for interpretation post-operatively. COMPARISON:  02/27/2015 FINDINGS: Intraoperative fluoroscopy is utilized for surgical control purposes demonstrating internal fixation of a left hip fracture. Fluoroscopy time is recorded at 1 minute 0 seconds. Cumulative area  dose product is 146.36 cGy per cm square. Cumulative air kerma is 10.13 mGy. Four spot fluoroscopic images are obtained. Spot fluoroscopic images of the left hip obtained demonstrate internal fixation of an intertrochanteric fracture of the proximal left femur using intra medullary rod and compression bolt. There is a distal locking screw at the distal rod. Fracture fragments appear in near anatomic alignment and position. No dislocation of the hip is demonstrated. IMPRESSION: Intraoperative fluoroscopy is utilized for surgical control purposes demonstrating internal fixation of left hip fracture. Electronically Signed   By: Lucienne Capers M.D.   On: 02/27/2015 22:46   Dg Hip Unilat With Pelvis 2-3 Views Left  02/27/2015  CLINICAL DATA:  Fall at 2:30 this morning.  Unable to move leg. EXAM: DG HIP (WITH OR WITHOUT PELVIS) 2-3V LEFT COMPARISON:  None. FINDINGS: Acute left intertrochanteric fracture with mild varus angulation. Mild degenerative spurring of the acetabula bilaterally. No other fractures of the bony pelvis identified. IMPRESSION: 1. Acute left intertrochanteric fracture with mild varus angulation. Electronically Signed   By: Van Clines M.D.   On: 02/27/2015 09:03     ASSESSMENT AND PLAN:   Active Problems:   Closed left hip fracture (HCC)   : 1.Left hip fracture closed: S/P ORIF;continue PT, Lovenox.  #2 hypertension: Well controlled. #3 her overactive bladder continue home medications RLS continue home medications.  Likely discharge to rehabilitation tomorrow, discontinue Foley catheter.  meds are reviewed and case discussed with Care Management/Social Workerr. Management plans discussed with the patient, family and they are in agreement.  CODE STATUS: Full  TOTAL TIME TAKING CARE OF THIS PATIENT: 35 minutes.   POSSIBLE D/C IN 1-2DAYS, DEPENDING ON CLINICAL CONDITION.   Epifanio Lesches M.D on 02/28/2015 at 11:20 AM  Between 7am to 6pm - Pager -  [phone]  After 6pm  go to [email or web address] - password [email or web address]  Office  [phone]  CC: Primary care physician; Lavera Guise, MD

## 2015-02-28 NOTE — Progress Notes (Signed)
Patient dangled and tolerated well. Patient demonstrates correct use of incentive spirometer.

## 2015-02-28 NOTE — Progress Notes (Signed)
Physical Therapy Treatment Patient Details Name: LASHONYA SHAFRAN MRN: [id number] DOB: 01-Apr-1923 Today's Date: 02/28/2015    History of Present Illness This patient is an 81 year old female who came to [facility] after a fall an suffering a left femur fracture. She recieved an ORIF repair.    PT Comments    Pt demonstrates slightly improving L hip flexion strength with decreased pain during session. However she is still unable to advance LLE without assistance during ambulation. Ambulation distance is significantly limited due to fatigue. SaO2 drops to 87% on room air and supplemental O2 reapplied. Pt is able to complete all sitting exercises as instructed with minimal assistance for L hip marches. She remains very limited with ambulation due to pain and weakness. Pt will benefit from skilled PT services to address deficits in strength, balance, and mobility in order to return to full function at home.    Follow Up Recommendations  SNF     Equipment Recommendations   (TBD at facility)    Recommendations for Other Services       Precautions / Restrictions Precautions Precautions: Fall Restrictions Weight Bearing Restrictions: Yes LLE Weight Bearing: Weight bearing as tolerated    Mobility  Bed Mobility Overal bed mobility: Needs Assistance;+2 for physical assistance Bed Mobility: Sit to Supine       Sit to supine: Mod assist   General bed mobility comments: Cues to assist L hip flexion with RLE. +2 required to assist back as well as LE's back to bed. Pt reports increased pain with bed mobility  Transfers Overall transfer level: Needs assistance Equipment used: Rolling walker (2 wheeled) Transfers: Sit to/from Stand Sit to Stand: Min assist;+2 physical assistance         General transfer comment: Pt continues to demonstrate decreased weight shifting to LLE during transfers. She reports increased pain when coming to standing. Decreased LE strength and power  noted.  Ambulation/Gait Ambulation/Gait assistance: Mod assist;+2 physical assistance Ambulation Distance (Feet): 5 Feet Assistive device: Rolling walker (2 wheeled) Gait Pattern/deviations: Step-to pattern Gait velocity: Decreased   General Gait Details: Cues for proper sequencing with walker. Pt still requires continual assist to advance LLE due to weakness with L hip flexion. Excessie UE support required during ambulation. SaO2 monitored and drops to 87% with exertion and does not recover with seated rest break. Supplemental O2 reapplied. Pt takes small side steps to the R toward [facility] in order to return to supine positioning.   Stairs            Wheelchair Mobility    Modified Rankin (Stroke Patients Only)       Balance Overall balance assessment: Needs assistance   Sitting balance-Leahy Scale: Good       Standing balance-Leahy Scale: Poor                      Cognition Arousal/Alertness: Awake/alert Behavior During Therapy: WFL for tasks assessed/performed Overall Cognitive Status: Within Functional Limits for tasks assessed                      Exercises General Exercises - Lower Extremity Ankle Circles/Pumps: Strengthening;Both;15 reps;Supine Quad Sets: Strengthening;Both;15 reps;Supine Gluteal Sets: Strengthening;Both;15 reps;Supine Long Arc Quad: Strengthening;Left;10 reps;Seated Hip ABduction/ADduction: Strengthening;Left;15 reps;Seated Hip Flexion/Marching: Strengthening;10 reps;Left;Seated Heel Raises: Strengthening;Both;10 reps;Seated    General Comments        Pertinent Vitals/Pain Pain Assessment: No/denies pain (at rest)    Home Living  Prior Function            PT Goals (current goals can now be found in the care plan section) Acute Rehab PT Goals Patient Stated Goal: would like to go home but thinks it may be a good idea to go to rehab PT Goal Formulation: With patient Time For Goal  Achievement: 03/14/15 Potential to Achieve Goals: Good Progress towards PT goals: Progressing toward goals    Frequency  BID    PT Plan Current plan remains appropriate    Co-evaluation             End of Session Equipment Utilized During Treatment: Gait belt Activity Tolerance: Patient limited by pain Patient left: in chair;with call bell/phone within reach;with chair alarm set;with SCD's reapplied     Time: 1349-1416 PT Time Calculation (min) (ACUTE ONLY): 27 min  Charges:  $Therapeutic Exercise: 8-22 mins $Therapeutic Activity: 8-22 mins                    G Codes:      Lyndel Safe Huprich PT, DPT   Huprich,Jason 02/28/2015, 2:19 PM

## 2015-03-01 LAB — CBC
HCT: 27.4 % — ABNORMAL LOW (ref 35.0–47.0)
HEMOGLOBIN: 9.1 g/dL — AB (ref 12.0–16.0)
MCH: 29.6 pg (ref 26.0–34.0)
MCHC: 33.2 g/dL (ref 32.0–36.0)
MCV: 89.2 fL (ref 80.0–100.0)
Platelets: 122 10*3/uL — ABNORMAL LOW (ref 150–440)
RBC: 3.07 MIL/uL — AB (ref 3.80–5.20)
RDW: 13.4 % (ref 11.5–14.5)
WBC: 7.3 10*3/uL (ref 3.6–11.0)

## 2015-03-01 LAB — BASIC METABOLIC PANEL
ANION GAP: 9 (ref 5–15)
BUN: 18 mg/dL (ref 6–20)
CHLORIDE: 100 mmol/L — AB (ref 101–111)
CO2: 27 mmol/L (ref 22–32)
Calcium: 8 mg/dL — ABNORMAL LOW (ref 8.9–10.3)
Creatinine, Ser: 0.82 mg/dL (ref 0.44–1.00)
Glucose, Bld: 135 mg/dL — ABNORMAL HIGH (ref 65–99)
POTASSIUM: 3.5 mmol/L (ref 3.5–5.1)
SODIUM: 136 mmol/L (ref 135–145)

## 2015-03-01 MED ORDER — LACTULOSE 10 GM/15ML PO SOLN: 10.0000 g | Freq: Two times a day (BID) | ORAL | Status: DC | PRN

## 2015-03-01 MED ORDER — SENNOSIDES-DOCUSATE SODIUM 8.6-50 MG PO TABS: 1.0000 | ORAL_TABLET | Freq: Two times a day (BID) | ORAL | Status: DC

## 2015-03-01 MED ORDER — LACTULOSE 10 GM/15ML PO SOLN
10.0000 g | Freq: Two times a day (BID) | ORAL | Status: DC | PRN
  Administered 2015-03-01: 10 g via ORAL
  Filled 2015-03-01 (×2): qty 30

## 2015-03-01 MED ORDER — ENOXAPARIN SODIUM 40 MG/0.4ML ~~LOC~~ SOLN
40.0000 mg | SUBCUTANEOUS | Status: DC
  Administered 2015-03-02 – 2015-03-05 (×4): 40 mg via SUBCUTANEOUS
  Filled 2015-03-01 (×5): qty 0.4

## 2015-03-01 MED ORDER — TRAMADOL HCL 50 MG PO TABS: 50.0000 mg | ORAL_TABLET | ORAL | Status: DC | PRN

## 2015-03-01 MED ORDER — ENOXAPARIN SODIUM 40 MG/0.4ML ~~LOC~~ SOLN: 40.0000 mg | SUBCUTANEOUS | Status: DC

## 2015-03-01 MED ORDER — OXYCODONE HCL 5 MG PO TABS: 5.0000 mg | ORAL_TABLET | ORAL | Status: DC | PRN

## 2015-03-01 NOTE — Progress Notes (Signed)
[place] [facility] [place] NAME: Kristine Jacobson    MR#:  [id number]  DATE OF BIRTH:  06/20/1923  SUBJECTIVE: 81 year old female patient admitted for left hip fracture status post surgery. Postoperatively she is doing better. Pain well controlled. Denies any other complaints. Patient says she is ready to go to rehabilitation tomorrow. Has constipation   CHIEF COMPLAINT:   Chief Complaint  Patient presents with  . Fall    REVIEW OF SYSTEMS:    Review of Systems  Constitutional: Negative for fever and chills.  HENT: Negative for hearing loss.   Eyes: Negative for blurred vision, double vision and photophobia.  Respiratory: Negative for cough, hemoptysis and shortness of breath.   Cardiovascular: Negative for palpitations, orthopnea and leg swelling.  Gastrointestinal: Negative for vomiting, abdominal pain and diarrhea.  Genitourinary: Negative for dysuria and urgency.  Musculoskeletal: Negative for myalgias and neck pain.       SLIGHT left hip pain. Well controlled with pain medicine.  Skin: Negative for rash.  Neurological: Negative for dizziness, focal weakness, seizures, weakness and headaches.  Psychiatric/Behavioral: Negative for memory loss. The patient does not have insomnia.     Nutrition:  Tolerating Diet: Tolerating PT:      DRUG ALLERGIES:   Allergies  Allergen Reactions  . Codeine Rash    VITALS:  Blood pressure 122/42, pulse 54, temperature 98.9 F (37.2 C), temperature source Oral, resp. rate 18, height 5\' 7"  (1.702 m), weight 68.04 kg (150 lb), SpO2 93 %.  PHYSICAL EXAMINATION:   Physical Exam  GENERAL:  81 y.o.-year-old patient lying in the bed with no acute distress.  EYES: Pupils equal, round, reactive to light and accommodation. No scleral icterus. Extraocular muscles intact.  HEENT: Head atraumatic, normocephalic. Oropharynx and nasopharynx clear.  NECK:  Supple, no jugular venous distention. No thyroid  enlargement, no tenderness.  LUNGS: Normal breath sounds bilaterally, no wheezing, rales,rhonchi or crepitation. No use of accessory muscles of respiration.  CARDIOVASCULAR: S1, S2 normal. No murmurs, rubs, or gallops.  ABDOMEN: Soft, nontender, nondistended. Bowel sounds present. No organomegaly or mass.  EXTREMITIES: No pedal edema, cyanosis, or clubbing.  NEUROLOGIC: Cranial nerves II through XII are intact. Muscle strength 5/5 in all extremities. Sensation intact. Gait not checked.  PSYCHIATRIC: The patient is alert and oriented x 3.  SKIN: No obvious rash, lesion, or ulcer.    LABORATORY PANEL:   CBC  Recent Labs Lab 03/01/15 0418  WBC 7.3  HGB 9.1*  HCT 27.4*  PLT 122*   ------------------------------------------------------------------------------------------------------------------  Chemistries   Recent Labs Lab 02/27/15 0822  03/01/15 0418  NA 137  < > 136  K 3.5  < > 3.5  CL 103  < > 100*  CO2 25  < > 27  GLUCOSE 147*  < > 135*  BUN 21*  < > 18  CREATININE 0.68  < > 0.82  CALCIUM 9.3  < > 8.0*  AST 34  --   --   ALT 26  --   --   ALKPHOS 66  --   --   BILITOT 0.8  --   --   < > = values in this interval not displayed. ------------------------------------------------------------------------------------------------------------------  Cardiac Enzymes  Recent Labs Lab 02/27/15 0822  TROPONINI <0.03   ------------------------------------------------------------------------------------------------------------------  RADIOLOGY:  Dg C-arm 1-60 Min  02/27/2015  CLINICAL DATA:  Left hip nailing in the OR. EXAM: OPERATIVE left HIP (WITH PELVIS IF PERFORMED) 4 VIEWS TECHNIQUE: Fluoroscopic spot  image(s) were submitted for interpretation post-operatively. COMPARISON:  02/27/2015 FINDINGS: Intraoperative fluoroscopy is utilized for surgical control purposes demonstrating internal fixation of a left hip fracture. Fluoroscopy time is recorded at 1 minute 0 seconds.  Cumulative area dose product is 146.36 cGy per cm square. Cumulative air kerma is 10.13 mGy. Four spot fluoroscopic images are obtained. Spot fluoroscopic images of the left hip obtained demonstrate internal fixation of an intertrochanteric fracture of the proximal left femur using intra medullary rod and compression bolt. There is a distal locking screw at the distal rod. Fracture fragments appear in near anatomic alignment and position. No dislocation of the hip is demonstrated. IMPRESSION: Intraoperative fluoroscopy is utilized for surgical control purposes demonstrating internal fixation of left hip fracture. Electronically Signed   By: Lucienne Capers M.D.   On: 02/27/2015 22:46   Dg Hip Operative Unilat With Pelvis Left  02/27/2015  CLINICAL DATA:  Left hip nailing in the OR. EXAM: OPERATIVE left HIP (WITH PELVIS IF PERFORMED) 4 VIEWS TECHNIQUE: Fluoroscopic spot image(s) were submitted for interpretation post-operatively. COMPARISON:  02/27/2015 FINDINGS: Intraoperative fluoroscopy is utilized for surgical control purposes demonstrating internal fixation of a left hip fracture. Fluoroscopy time is recorded at 1 minute 0 seconds. Cumulative area dose product is 146.36 cGy per cm square. Cumulative air kerma is 10.13 mGy. Four spot fluoroscopic images are obtained. Spot fluoroscopic images of the left hip obtained demonstrate internal fixation of an intertrochanteric fracture of the proximal left femur using intra medullary rod and compression bolt. There is a distal locking screw at the distal rod. Fracture fragments appear in near anatomic alignment and position. No dislocation of the hip is demonstrated. IMPRESSION: Intraoperative fluoroscopy is utilized for surgical control purposes demonstrating internal fixation of left hip fracture. Electronically Signed   By: Lucienne Capers M.D.   On: 02/27/2015 22:46     ASSESSMENT AND PLAN:   Active Problems:   Closed left hip fracture (HCC)   :  1.Left hip fracture closed: S/P ORIF;continue PT, Lovenox.Marland Kitchen Discharge orders are in the computer likely discharge to rehabilitation tomorrow  #2 hypertension: Well controlled. #3 her overactive bladder continue home medications RLS continue home medications. #4 constipation continue stool softeners.  meds are reviewed and case discussed with Care Management/Social Workerr. Management plans discussed with the patient, family and they are in agreement.  CODE STATUS: Full  TOTAL TIME TAKING CARE OF THIS PATIENT: 35 minutes.   POSSIBLE D/C IN 1-2DAYS, DEPENDING ON CLINICAL CONDITION.   Epifanio Lesches M.D on 03/01/2015 at 9:31 AM  Between 7am to 6pm - Pager - [phone]  After 6pm go to [email or web address] - password [email or web address] [place] [facility]  Office  [phone]  CC: Primary care physician; Lavera Guise, MD

## 2015-03-01 NOTE — Care Management Important Message (Signed)
Important Message  Patient Details  Name: CYENTHIA SPEGAL MRN: [id number] Date of Birth: 1923-08-31   Medicare Important Message Given:  Yes    Anapaola Kinsel A, RN 03/01/2015, 8:34 AM

## 2015-03-01 NOTE — Progress Notes (Signed)
Physical Therapy Treatment Patient Details Name: Kristine Jacobson MRN: [id number] DOB: [birth year] Today's Date: 03/01/2015    History of Present Illness This patient is an 81 year old female who came to [facility] after a fall an suffering a left femur fracture. She recieved an ORIF repair.    PT Comments    Pt continues to be very limited with how she is able to tolerate standing acts and does very poorly with attempts to shift weight and actually step with each foot.  Despite heavy cuing to use UEs on the walker and take some weight she consistently pulls up on the walker and needs heavy assist from the PT to stay calm and to keep weight forward on a walker in appropriate position.   Follow Up Recommendations  SNF     Equipment Recommendations       Recommendations for Other Services       Precautions / Restrictions Precautions Precautions: Fall Restrictions Weight Bearing Restrictions: Yes LLE Weight Bearing: Weight bearing as tolerated    Mobility  Bed Mobility Overal bed mobility: Needs Assistance Bed Mobility: Sit to Supine       Sit to supine: Mod assist   General bed mobility comments: Pt shows effort trying to get into be but ultimately needs assist getting back to bed  Transfers Overall transfer level: Needs assistance Equipment used: Rolling walker (2 wheeled) Transfers: Sit to/from Stand Sit to Stand: Max assist;Mod assist         General transfer comment: Pt very limited with ability to lift herself even with much cuing and set up  Ambulation/Gait Ambulation/Gait assistance: Max assist Ambulation Distance (Feet): 3 Feet Assistive device: Rolling walker (2 wheeled)       General Gait Details: Pt continues to do very poorly with standing/WBing/stepping and is unable to really take even a small step this afternoon.  She is able to partially shuffle/heel-toe feet but even then needs heavy assist to [facility] herself.     Stairs            Wheelchair  Mobility    Modified Rankin (Stroke Patients Only)       Balance                                    Cognition Arousal/Alertness: Awake/alert Behavior During Therapy: WFL for tasks assessed/performed Overall Cognitive Status: Within Functional Limits for tasks assessed                      Exercises General Exercises - Lower Extremity Ankle Circles/Pumps: Both;15 reps;Supine;Strengthening Quad Sets: Strengthening;15 reps;Supine Gluteal Sets: Strengthening;15 reps;Supine Short Arc Quad: Strengthening;Left;15 reps;Supine Long Arc Quad: Strengthening;Left;10 reps;Seated Heel Slides: Left;15 reps;Supine;AAROM;AROM Hip ABduction/ADduction: Left;15 reps;Supine;AAROM    General Comments        Pertinent Vitals/Pain Pain Assessment:  (pt again does not rate but has considerable pain with WBing)    Home Living                      Prior Function            PT Goals (current goals can now be found in the care plan section) Progress towards PT goals:  (very slow progression)    Frequency  BID    PT Plan Current plan remains appropriate    Co-evaluation  End of Session Equipment Utilized During Treatment: Gait belt Activity Tolerance: Patient limited by pain Patient left: with bed alarm set     Time: [id number] PT Time Calculation (min) (ACUTE ONLY): 28 min  Charges:  $Gait Training: 8-22 mins $Therapeutic Exercise: 8-22 mins                    G Codes:     Wayne Both, PT, DPT [phone]  Kreg Shropshire 03/01/2015, 5:01 PM

## 2015-03-01 NOTE — Progress Notes (Signed)
Plan is for patient to D/C to [facility] tomorrow (03/02/15). Per Kim admissions coordinator at [facility] patient is going to room 208-A. RN will call report at [phone]. Clinical Education officer, museum (CSW) faxed D/C orders to [employer] today. [facility] Medicare authorization has been received. Patient is aware of above.  Blima Rich, [place] [phone]

## 2015-03-01 NOTE — Progress Notes (Addendum)
   Subjective: 2 Days Post-Op Procedure(s) (LRB): INTRAMEDULLARY (IM) NAIL INTERTROCHANTRIC (Left) Patient reports pain as 5 on 0-10 scale.   Patient is well, and has had no acute complaints or problems Continue with physical therapy today.  Plan is to go Rehab after hospital stay. no nausea and no vomiting Patient denies any chest pains or shortness of breath. Patient complaining of her restless leg syndrome given her problems on the night Objective: Vital signs in last 24 hours: Temp:  [97.7 F (36.5 C)-98.9 F (37.2 C)] 98.9 F (37.2 C) (12/15 1932) Pulse Rate:  [54-102] 54 (12/16 0549) Resp:  [16-20] 18 (12/16 0549) BP: (114-152)/(42-66) 122/42 mmHg (12/16 0629) SpO2:  [92 %-97 %] 93 % (12/16 0548) well approximated incision Heels are non tender and elevated off the bed using rolled towels Intake/Output from previous day: 12/15 0701 - 12/16 0700 In: 1660 [P.O.:1360; IV Piggyback:300] Out: 325 [Urine:325] Intake/Output this shift:     Recent Labs  02/27/15 0822 02/28/15 0556 03/01/15 0418  HGB 12.9 10.7* 9.1*    Recent Labs  02/28/15 0556 03/01/15 0418  WBC 7.2 7.3  RBC 3.54* 3.07*  HCT 31.9* 27.4*  PLT 134* 122*    Recent Labs  02/28/15 0559 03/01/15 0418  NA 133* 136  K 3.6 3.5  CL 102 100*  CO2 25 27  BUN 16 18  CREATININE 0.78 0.82  GLUCOSE 121* 135*  CALCIUM 7.9* 8.0*   No results for input(s): LABPT, INR in the last 72 hours.  EXAM General - Patient is Alert, Appropriate and Oriented Extremity - Neurologically intact Neurovascular intact Sensation intact distally Intact pulses distally Dorsiflexion/Plantar flexion intact Dressing - dressing C/D/I Motor Function - intact, moving foot and toes well on exam.    Past Medical History  Diagnosis Date  . Arthritis   . Restless leg   . Overactive bladder   . Macular degeneration, age related   . HBP (high blood pressure)   . Spinal stenosis   . Osteoporosis     Assessment/Plan: 2  Days Post-Op Procedure(s) (LRB): INTRAMEDULLARY (IM) NAIL INTERTROCHANTRIC (Left) Active Problems:   Closed left hip fracture (HCC)  Estimated body mass index is 23.49 kg/(m^2) as calculated from the following:   Height as of this encounter: 5\' 7"  (1.702 m).   Weight as of this encounter: 68.04 kg (150 lb). Up with therapy Plan for discharge tomorrow Discharge to SNF  Labs: Were reviewed DVT Prophylaxis - Lovenox, Foot Pumps and TED hose Weight-Bearing as tolerated to left leg Patient needs to have a bowel movement today. We'll add lactulose Patient will need follow-up in [place] in 6 weeks with Dr. Haskel Khan will need to be removed from left hip 2 weeks postop and apply benzoin and half-inch Steri-Strips Continue Lovenox 40 mg daily for 14 days after discharge Change dressing as needed  Wille Glaser R. [place] Halbur 03/01/2015, 7:12 AM

## 2015-03-01 NOTE — Progress Notes (Signed)
Physical Therapy Treatment Patient Details Name: Kristine Jacobson MRN: [id number] DOB: May 19, 1923 Today's Date: 03/01/2015    History of Present Illness This patient is an 81 year old female who came to [facility] after a fall an suffering a left femur fracture. She recieved an ORIF repair.    PT Comments    Pt continues to struggle with ambulation/WBing and needs excessive time and encouragement for even very limited ambulation/weight shifting.  Pt is not really able to walk but does take a few very small, hesitant, shuffling steps with assist to [facility] and generally to remain safe and upright.   Follow Up Recommendations  SNF     Equipment Recommendations       Recommendations for Other Services       Precautions / Restrictions Precautions Precautions: Fall Restrictions LLE Weight Bearing: Weight bearing as tolerated    Mobility  Bed Mobility Overal bed mobility: Needs Assistance Bed Mobility: Supine to Sit     Supine to sit: Mod assist     General bed mobility comments: Pt very limited with ability to get to sitting EOB, she is able to use bed rails to try getting herself up to sitting  Transfers Overall transfer level: Needs assistance Equipment used: Rolling walker (2 wheeled) Transfers: Sit to/from Stand Sit to Stand: Max assist;Mod assist         General transfer comment: Pt shows good effort, but struggles to get to fully upright and needs much assist and encouragement.  Ambulation/Gait Ambulation/Gait assistance: Max assist Ambulation Distance (Feet): 5 Feet Assistive device: Rolling walker (2 wheeled)       General Gait Details: Pt very hesitant with WBing on the L and though she is motivated she needs heavy PT assist to [facility] and is only then able to take very hesitant, small lunging steps with the R LE and c/o considerable pain with the effort.    Stairs            Wheelchair Mobility    Modified Rankin (Stroke Patients Only)        Balance                                    Cognition Arousal/Alertness: Awake/alert Behavior During Therapy: WFL for tasks assessed/performed Overall Cognitive Status: Within Functional Limits for tasks assessed                      Exercises General Exercises - Lower Extremity Ankle Circles/Pumps: Both;15 reps;Supine;Strengthening Quad Sets: Strengthening;15 reps;Supine Gluteal Sets: Strengthening;15 reps;Supine Heel Slides: Left;15 reps;Supine;AAROM;AROM Hip ABduction/ADduction: Left;15 reps;Seated;AROM;AAROM    General Comments        Pertinent Vitals/Pain Pain Assessment:  (moderate pain at rest, increases with activity)    Home Living                      Prior Function            PT Goals (current goals can now be found in the care plan section) Progress towards PT goals: Progressing toward goals    Frequency  BID    PT Plan Current plan remains appropriate    Co-evaluation             End of Session Equipment Utilized During Treatment: Gait belt Activity Tolerance: Patient limited by pain Patient left: in chair;with call bell/phone within reach;with chair alarm set;with SCD's  reapplied     Time: [id number] PT Time Calculation (min) (ACUTE ONLY): 24 min  Charges:  $Gait Training: 8-22 mins $Therapeutic Exercise: 8-22 mins                    G Codes:     Wayne Both, PT, DPT [phone]  Kreg Shropshire 03/01/2015, 11:07 AM

## 2015-03-02 ENCOUNTER — Inpatient Hospital Stay: Payer: Medicare Other

## 2015-03-02 LAB — URINALYSIS COMPLETE WITH MICROSCOPIC (ARMC ONLY)
BACTERIA UA: NONE SEEN
BILIRUBIN URINE: NEGATIVE
GLUCOSE, UA: NEGATIVE mg/dL
Ketones, ur: NEGATIVE mg/dL
LEUKOCYTES UA: NEGATIVE
Nitrite: NEGATIVE
PH: 5 (ref 5.0–8.0)
Protein, ur: NEGATIVE mg/dL
SPECIFIC GRAVITY, URINE: 1.013 (ref 1.005–1.030)

## 2015-03-02 LAB — CBC
HEMATOCRIT: 25 % — AB (ref 35.0–47.0)
Hemoglobin: 8.3 g/dL — ABNORMAL LOW (ref 12.0–16.0)
MCH: 29.9 pg (ref 26.0–34.0)
MCHC: 33.2 g/dL (ref 32.0–36.0)
MCV: 90.1 fL (ref 80.0–100.0)
PLATELETS: 118 10*3/uL — AB (ref 150–440)
RBC: 2.77 MIL/uL — ABNORMAL LOW (ref 3.80–5.20)
RDW: 13.6 % (ref 11.5–14.5)
WBC: 6 10*3/uL (ref 3.6–11.0)

## 2015-03-02 MED ORDER — SODIUM CHLORIDE 0.9 % IV SOLN
INTRAVENOUS | Status: AC
  Administered 2015-03-02: 17:00:00 via INTRAVENOUS

## 2015-03-02 NOTE — Discharge Summary (Addendum)
[facility] [place] NAME: Kristine Jacobson    MR#:  [id number]  DATE OF BIRTH:  1923/04/16  DATE OF ADMISSION:  02/27/2015 ADMITTING PHYSICIAN: Epifanio Lesches, MD  DATE OF DISCHARGE: 03/05/2015  PRIMARY CARE PHYSICIAN: Lavera Guise, MD    ADMISSION DIAGNOSIS:  Fall [W19.XXXA] Closed left hip fracture, initial encounter ([place]) [S72.002A]  DISCHARGE DIAGNOSIS:  Active Problems:   Closed left hip fracture ([place])   SECONDARY DIAGNOSIS:   Past Medical History  Diagnosis Date  . Arthritis   . Restless leg   . Overactive bladder   . Macular degeneration, age related   . HBP (high blood pressure)   . Spinal stenosis   . Osteoporosis     HOSPITAL COURSE:   1.Left hip fracture closed: S/P ORIF on 12/14 by Dr Marry Guan. She will be discharged to [facility] skilled nursing facility. She will have Lovenox for 2 weeks per orthopedics protocol. She'll continue with physical therapy. Pain is fairly well controlled on oral pain medications, she should be encouraged to take medication prior to physical therapy sessions so that she can participate fully.  #2 hypertension: Well controlled. Continue home medications including carvedilol, losartan, hydrochlorothiazide.  #3 overactive bladder: continue home medications. No problems with this during the hospitalization.  #4 constipation: continue stool softeners. Encourage ambulation.  #5 restless leg syndrome: Continue Requip  #6 anemia: Stable. No bleeding noted. No excessive swelling at the surgical site.  #7 sinusitis: Has had fever up to 102 during hospitalization. Has had full fever work up including CTA chest, lower extremity dopplers, ECHO, blood cultures, urine cultures all negative.  Seen by infectious disease. Most likely due to sinus infection.  Now on augmentin and will complete 10 day course. Afebrile for 24 hours at discharge.  DISCHARGE CONDITIONS:    Stable for discharge to skilled nursing  CONSULTS OBTAINED:  Treatment Team:  Dereck Leep, MD Adrian Prows, MD  DRUG ALLERGIES:   Allergies  Allergen Reactions  . Codeine Rash    DISCHARGE MEDICATIONS:   Current Discharge Medication List    START taking these medications   Details  amoxicillin-clavulanate (AUGMENTIN) 875-125 MG tablet Take 1 tablet by mouth every 12 (twelve) hours. Qty: 18 tablet, Refills: 0    enoxaparin (LOVENOX) 40 MG/0.4ML injection Inject 0.4 mLs (40 mg total) into the skin daily. Qty: 14 Syringe, Refills: 0    guaiFENesin (ROBITUSSIN) 100 MG/5ML SOLN Take 5 mLs (100 mg total) by mouth every 4 (four) hours as needed for cough or to loosen phlegm. Qty: 1200 mL, Refills: 0    lactulose (CHRONULAC) 10 GM/15ML solution Take 15 mLs (10 g total) by mouth 2 (two) times daily as needed for mild constipation. Qty: 240 mL, Refills: 0    oxyCODONE (OXY IR/ROXICODONE) 5 MG immediate release tablet Take 1-2 tablets (5-10 mg total) by mouth every 4 (four) hours as needed for breakthrough pain ((for MODERATE breakthrough pain)). Qty: 30 tablet, Refills: 0    senna-docusate (SENOKOT-S) 8.6-50 MG tablet Take 1 tablet by mouth 2 (two) times daily. Qty: 30 tablet, Refills: 0    traMADol (ULTRAM) 50 MG tablet Take 1-2 tablets (50-100 mg total) by mouth every 4 (four) hours as needed for moderate pain. Qty: 30 tablet, Refills: 0      CONTINUE these medications which have NOT CHANGED   Details  amLODipine (NORVASC) 2.5 MG tablet Take 2.5 mg by mouth daily.     aspirin  81 MG tablet Take 81 mg by mouth daily.    carvedilol (COREG) 3.125 MG tablet Take 3.125 mg by mouth 2 (two) times daily with a meal.    gabapentin (NEURONTIN) 100 MG capsule Take 1 capsule by mouth 3 (three) times daily. Refills: 0    LINZESS 145 MCG CAPS capsule Take 1 capsule by mouth 2 (two) times a week. Refills: 0    losartan-hydrochlorothiazide (HYZAAR) 100-12.5 MG per tablet  Take 1 tablet by mouth daily.    rOPINIRole (REQUIP) 0.5 MG tablet Take 0.5 mg by mouth 3 (three) times daily.         DISCHARGE INSTRUCTIONS:    DIET:  Regular diet  DISCHARGE CONDITION:  Stable  ACTIVITY:  Activity as tolerated per physical therapy recommendations  OXYGEN:  Home Oxygen: No.   Oxygen Delivery: room air  DISCHARGE LOCATION:  nursing home   If you experience worsening of your admission symptoms, develop shortness of breath, life threatening emergency, suicidal or homicidal thoughts you must seek medical attention immediately by calling 911 or calling your MD immediately  if symptoms less severe.  You Must read complete instructions/literature along with all the possible adverse reactions/side effects for all the Medicines you take and that have been prescribed to you. Take any new Medicines after you have completely understood and accpet all the possible adverse reactions/side effects.   Please note  You were cared for by a hospitalist during your hospital stay. If you have any questions about your discharge medications or the care you received while you were in the hospital after you are discharged, you can call the unit and asked to speak with the hospitalist on call if the hospitalist that took care of you is not available. Once you are discharged, your primary care physician will handle any further medical issues. Please note that NO REFILLS for any discharge medications will be authorized once you are discharged, as it is imperative that you return to your primary care physician (or establish a relationship with a primary care physician if you do not have one) for your aftercare needs so that they can reassess your need for medications and monitor your lab values.    Today   CHIEF COMPLAINT:   Chief Complaint  Patient presents with  . Fall   Doing well this morning. No complaints.  HISTORY OF PRESENT ILLNESS:  Kristine Jacobson is a 81 y.o. female with  a known history of attention, overactive bladder, restless leg syndrome comes in because of fall and then left hip fracture. Patient had a mechanical fall tapered over the legs of recliner and then suffered a left hip fracture. Patient was visiting.daughter that had emergency appendectomy and slept very late like 2:00 in the morning. Did not have dizziness or chest pain. Had a mechanical fall.  VITAL SIGNS:  Blood pressure 115/44, pulse 65, temperature 98 F (36.7 C), temperature source Oral, resp. rate 18, height 5\' 7"  (1.702 m), weight 68.04 kg (150 lb), SpO2 98 %.  I/O:    Intake/Output Summary (Last 24 hours) at 03/05/15 0936 Last data filed at 03/05/15 0900  Gross per 24 hour  Intake    240 ml  Output      0 ml  Net    240 ml    PHYSICAL EXAMINATION:  GENERAL:  81 y.o.-year-old patient sitting up in the bed with no acute distress.  LUNGS: Normal breath sounds bilaterally, no wheezing, rales, rhonchi or crepitation. No use of accessory muscles of  respiration.  CARDIOVASCULAR: S1, S2 normal. No murmurs, rubs, or gallops.  ABDOMEN: Soft, non-tender, non-distended. Bowel sounds present. No organomegaly or mass.  EXTREMITIES: No pedal edema, cyanosis, or clubbing.  NEUROLOGIC: Cranial nerves II through XII are intact.  PSYCHIATRIC: The patient is alert and oriented x 3.  SKIN: No obvious rash, lesion, or ulcer.   DATA REVIEW:   CBC  Recent Labs Lab 03/04/15 0325  WBC 5.8  HGB 8.0*  HCT 23.5*  PLT 151    Chemistries   Recent Labs Lab 03/04/15 0325  NA 136  K 3.3*  CL 99*  CO2 29  GLUCOSE 122*  BUN 20  CREATININE 0.71  CALCIUM 8.1*  AST 27  ALT 16  ALKPHOS 45  BILITOT 0.5    Cardiac Enzymes  Recent Labs Lab 02/27/15 0822  TROPONINI <0.03    Microbiology Results  Results for orders placed or performed during the hospital encounter of 02/27/15  Surgical pcr screen     Status: None   Collection Time: 02/27/15 11:47 AM  Result Value Ref Range Status    MRSA, PCR NEGATIVE NEGATIVE Final   Staphylococcus aureus NEGATIVE NEGATIVE Final    Comment:        The Xpert SA Assay (FDA approved for NASAL specimens in patients over 66 years of age), is one component of a comprehensive surveillance program.  Test performance has been validated by [facility] for patients greater than or equal to 73 year old. It is not intended to diagnose infection nor to guide or monitor treatment.   Culture, blood (Routine X 2) w Reflex to ID Panel     Status: None (Preliminary result)   Collection Time: 03/02/15 11:44 AM  Result Value Ref Range Status   Specimen Description BLOOD LEFT AC  Final   Special Requests   Final    BOTTLES DRAWN AEROBIC AND ANAEROBIC  AER 4CC ANA 1CC   Culture NO GROWTH 2 DAYS  Final   Report Status PENDING  Incomplete  Culture, blood (Routine X 2) w Reflex to ID Panel     Status: None (Preliminary result)   Collection Time: 03/02/15 11:56 AM  Result Value Ref Range Status   Specimen Description BLOOD RIGHT HAND  Final   Special Requests BOTTLES DRAWN AEROBIC AND ANAEROBIC  6CC  Final   Culture NO GROWTH 2 DAYS  Final   Report Status PENDING  Incomplete  Urine culture     Status: None   Collection Time: 03/02/15  5:36 PM  Result Value Ref Range Status   Specimen Description URINE, RANDOM  Final   Special Requests NONE  Final   Culture MULTIPLE SPECIES PRESENT, SUGGEST RECOLLECTION  Final   Report Status 03/04/2015 FINAL  Final    RADIOLOGY:  Ct Angio Chest Pe W/cm &/or Wo Cm  03/03/2015  CLINICAL DATA:  Short of breath for 1 day EXAM: CT ANGIOGRAPHY CHEST WITH CONTRAST TECHNIQUE: Multidetector CT imaging of the chest was performed using the standard protocol during bolus administration of intravenous contrast. Multiplanar CT image reconstructions and MIPs were obtained to evaluate the vascular anatomy. CONTRAST:  39mL OMNIPAQUE IOHEXOL 350 MG/ML SOLN COMPARISON:  None. FINDINGS: There are no filling defects in the pulmonary  arterial tree to suggest acute pulmonary thromboembolism. No evidence of aortic dissection or aneurysm. Atherosclerotic calcifications are noted including within the coronary arteries. No abnormal mediastinal adenopathy. Small mediastinal nodes are noted. No pneumothorax.  Small bilateral pleural effusions. Subsegmental atelectasis towards the lung bases  and in the dependent lower lobes. 8 mm left lower lobe pulmonary nodule on image 79. Healing right antral lateral rib fracture on image 59 of series 6. No vertebral compression deformity. Review of the MIP images confirms the above findings. IMPRESSION: No evidence of acute pulmonary thromboembolism 8 mm left lower lobe pulmonary nodule. If the patient is at high risk for bronchogenic carcinoma, follow-up chest CT at 3-47months is recommended. If the patient is at low risk for bronchogenic carcinoma, follow-up chest CT at 6-12 months is recommended. This recommendation follows the consensus statement: Guidelines for Management of Small Pulmonary Nodules Detected on CT Scans: A Statement from the [place] as published in Radiology 2005; 237:395-400. Small pleural effusions. Electronically Signed   By: Marybelle Killings M.D.   On: 03/03/2015 12:43   US Venous Img Lower Bilateral  03/03/2015  CLINICAL DATA:  Recent hip surgery. Left leg pain and edema. Fevers. Bilateral leg swelling. EXAM: BILATERAL LOWER EXTREMITY VENOUS DOPPLER ULTRASOUND TECHNIQUE: Gray-scale sonography with graded compression, as well as color Doppler and duplex ultrasound were performed to evaluate the lower extremity deep venous systems from the level of the common femoral vein and including the common femoral, femoral, profunda femoral, popliteal and calf veins including the posterior tibial, peroneal and gastrocnemius veins when visible. The superficial great saphenous vein was also interrogated. Spectral Doppler was utilized to evaluate flow at rest and with distal augmentation  maneuvers in the common femoral, femoral and popliteal veins. COMPARISON:  None. FINDINGS: RIGHT LOWER EXTREMITY Common Femoral Vein: No evidence of thrombus. Normal compressibility, respiratory phasicity and response to augmentation. Saphenofemoral Junction: No evidence of thrombus. Normal compressibility and flow on color Doppler imaging. Profunda Femoral Vein: No evidence of thrombus. Normal compressibility and flow on color Doppler imaging. Femoral Vein: No evidence of thrombus. Normal compressibility, respiratory phasicity and response to augmentation. Popliteal Vein: No evidence of thrombus. Normal compressibility, respiratory phasicity and response to augmentation. Calf Veins: No evidence of thrombus. Normal compressibility and flow on color Doppler imaging. Superficial Great Saphenous Vein: No evidence of thrombus. Normal compressibility and flow on color Doppler imaging. Venous Reflux:  None. Other Findings:  None. LEFT LOWER EXTREMITY Common Femoral Vein: No evidence of thrombus. Normal compressibility, respiratory phasicity and response to augmentation. Saphenofemoral Junction: No evidence of thrombus. Normal compressibility and flow on color Doppler imaging. Profunda Femoral Vein: No evidence of thrombus. Normal compressibility and flow on color Doppler imaging. Femoral Vein: No evidence of thrombus. Normal compressibility, respiratory phasicity and response to augmentation. Popliteal Vein: No evidence of thrombus. Normal compressibility, respiratory phasicity and response to augmentation. Calf Veins: No evidence of thrombus. Normal compressibility and flow on color Doppler imaging. Superficial Great Saphenous Vein: No evidence of thrombus. Normal compressibility and flow on color Doppler imaging. Venous Reflux:  None. Other Findings:  None. IMPRESSION: No evidence of deep venous thrombosis. Electronically Signed   By: Kerby Moors M.D.   On: 03/03/2015 16:20    EKG:   Orders placed or performed  during the hospital encounter of 02/27/15  . ED EKG  . ED EKG  . EKG 12-Lead  . EKG 12-Lead      Management plans discussed with the patient, family and they are in agreement.  CODE STATUS:     Code Status Orders        Start     Ordered   02/28/15 0013  Full code   Continuous     02/28/15 0012    Advance Directive Documentation  Most Recent Value   Type of Advance Directive  Living will   Pre-existing out of facility DNR order (yellow form or pink MOST form)     "MOST" Form in Place?        TOTAL TIME TAKING CARE OF THIS PATIENT: 35 minutes.  Greater than 50% of time spent in care coordination and counseling.  Myrtis Ser M.D on 03/05/2015 at 9:36 AM  Between 7am to 6pm - Pager - [phone]  After 6pm go to [email or web address] - password [email or web address]  Office  [phone]  CC: Primary care physician; Lavera Guise, MD

## 2015-03-02 NOTE — Progress Notes (Signed)
Physical Therapy Treatment Patient Details Name: Kristine Jacobson MRN: [id number] DOB: 11/17/1923 Today's Date: 03/02/2015    History of Present Illness This patient is an 81 year old female who came to [facility] after a fall an suffering a left femur fracture. She recieved an ORIF repair.    PT Comments    Pt struggles with mobility acts today. She is able to show good effort with exercises and has minimal pain with these.  Getting to sitting and subsequent attempts at standing we difficult and ultimately she did poorly with both.  Pt needed heavy assist to get to sitting and still struggled to maintain upright sitting posture, multiple attempts at standing were max/total assist efforts (though pt clearly trying to assist) that never got to full standing and ultimately pt was very disappointed that she could not do more today.  No walking as pt was unable to even achieve upright with max assist.  Follow Up Recommendations  SNF     Equipment Recommendations       Recommendations for Other Services       Precautions / Restrictions Precautions Precautions: Fall Restrictions LLE Weight Bearing: Weight bearing as tolerated    Mobility  Bed Mobility Overal bed mobility: Needs Assistance Bed Mobility: Sit to Supine;Supine to Sit     Supine to sit: Mod assist;Max assist Sit to supine: Max assist   General bed mobility comments: Pt again shows good effort, but is very limited with what she is able to do.  She needs considerable assist both getting in/out and struggles to remain sitting upright at EOB w/o assist.  Transfers Overall transfer level: Needs assistance Equipment used: Rolling walker (2 wheeled) Transfers: Sit to/from Stand Sit to Stand: Max assist;Total assist         General transfer comment: 2 attempts at standing today with poor results.  Pt shows willingness to try but needs very heavy assist and simply is unable to get herself to upright showing poor ability to use  U&LEs appropriately, inability to get trunk upright and hips forward and generally being ineffective in attempts to assist rising.  Ambulation/Gait             General Gait Details: pt unable to even get to standing today with max assist   Stairs            Wheelchair Mobility    Modified Rankin (Stroke Patients Only)       Balance                                    Cognition Arousal/Alertness: Awake/alert Behavior During Therapy: WFL for tasks assessed/performed Overall Cognitive Status: Within Functional Limits for tasks assessed                      Exercises General Exercises - Lower Extremity Ankle Circles/Pumps: Both;15 reps;Supine;Strengthening Quad Sets: Strengthening;15 reps;Supine Gluteal Sets: Strengthening;15 reps;Supine Short Arc Quad: Strengthening;Left;15 reps;Supine Heel Slides: Left;15 reps;Supine;AAROM;AROM Hip ABduction/ADduction: Left;15 reps;Supine;AAROM Straight Leg Raises: Strengthening;Left;15 reps;Supine    General Comments        Pertinent Vitals/Pain Pain Assessment:  (no pain at rest, some minimal pain with movement)    Home Living                      Prior Function            PT Goals (  current goals can now be found in the care plan section)      Frequency  BID    PT Plan Current plan remains appropriate    Co-evaluation             End of Session Equipment Utilized During Treatment: Gait belt Activity Tolerance: Patient limited by pain (pt fearful of standing and despite motivation is unable) Patient left: with bed alarm set     Time: 1030-1055 PT Time Calculation (min) (ACUTE ONLY): 25 min  Charges:  $Therapeutic Exercise: 8-22 mins $Therapeutic Activity: 8-22 mins                    G Codes:     Wayne Both, PT, DPT [phone]  Kreg Shropshire 03/02/2015, 11:29 AM

## 2015-03-02 NOTE — Progress Notes (Addendum)
Patient's O2 sats taken while lying in bed on room air are 88%; with 2L O2 98%.  Cannot test while walking d/t weakness & pain following surgery.

## 2015-03-02 NOTE — Progress Notes (Signed)
Spoke to Dr. Volanda Napoleon regarding patient's b/p of 100/39. Advised her that I will hold her cavadilol. Gave her results on chest xray & told her that I would be doing the bladder scan.

## 2015-03-02 NOTE — Progress Notes (Signed)
Patient's discharge was discontinued.  Family and [place] notified.  CSW will continue to follow to assist when patient is medically stable to discharge to SNF.  Casimer Lanius. Latanya Presser, MSW Clinical Social Work Department [phone] 5:15 PM

## 2015-03-02 NOTE — Progress Notes (Signed)
Physical Therapy Treatment Patient Details Name: Kristine Jacobson MRN: [id number] DOB: Feb 11, 1924 Today's Date: 03/02/2015    History of Present Illness This patient is an 81 year old female who came to [facility] after a fall an suffering a left femur fracture. She recieved an ORIF repair.    PT Comments    Pt is very pleasant and eager to work with PT but remains very weak and limited. She was actually able to get up to standing this afternoon but continues to have a very hard time tolerating WBing, trying to lift foot and does not use the walker appropriately, requiring heavy cuing and assist.   Follow Up Recommendations  SNF     Equipment Recommendations       Recommendations for Other Services       Precautions / Restrictions Precautions Precautions: Fall Restrictions LLE Weight Bearing: Weight bearing as tolerated    Mobility  Bed Mobility Overal bed mobility: Needs Assistance Bed Mobility: Sit to Supine;Supine to Sit     Supine to sit: Mod assist Sit to supine: Max assist;Mod assist   General bed mobility comments: Pt needing considerable assist to get to EOB, though she shows good effort she continues to lack strength enough to get herself up to sitting or back into bed  Transfers Overall transfer level: Needs assistance Equipment used: Rolling walker (2 wheeled) Transfers: Sit to/from Stand Sit to Stand: Max assist         General transfer comment: 2 attempts again this afternoon, pt was able to get to standing on the second and though she is very reliant on leaning back on the bed to maintain standing she was able to do it.  With much cuing and max assist we were able to stand w/o backs of legs leaning and heavy use of UEs on the walker.   Ambulation/Gait Ambulation/Gait assistance: Total assist Ambulation Distance (Feet): 1 Feet Assistive device: Rolling walker (2 wheeled)       General Gait Details: Pt very much struggles to do any weight shifts and though  she clearly is making a great effort she simply can't maintain tolerate much weight through the L and is only able to do very guarded and marginal heel-toe shifts along the EOB (despite heavy assist from PT to [employer])   Science writer    Modified Rankin (Stroke Patients Only)       Balance       Sitting balance - Comments: pt struggles with sitting balance, leaning R and generally unable to engage core to maintain upright                             Cognition Arousal/Alertness: Awake/alert Behavior During Therapy: WFL for tasks assessed/performed Overall Cognitive Status: Within Functional Limits for tasks assessed                      Exercises General Exercises - Lower Extremity Ankle Circles/Pumps: Both;15 reps;Supine;Strengthening Quad Sets: Strengthening;15 reps;Supine Gluteal Sets: Strengthening;15 reps;Supine Short Arc Quad: Strengthening;Left;15 reps;Supine Heel Slides: Left;15 reps;Supine;AAROM;AROM Hip ABduction/ADduction: Left;15 reps;Supine;AAROM Straight Leg Raises: Strengthening;Left;15 reps;Supine    General Comments        Pertinent Vitals/Pain Pain Assessment:  (per normal minimal pain at rest, very increased with WBing)    Home Living  Prior Function            PT Goals (current goals can now be found in the care plan section) Progress towards PT goals: Progressing toward goals    Frequency  BID    PT Plan Current plan remains appropriate    Co-evaluation             End of Session Equipment Utilized During Treatment: Gait belt Activity Tolerance: Patient limited by pain Patient left: with bed alarm set     Time: 1610-1635 PT Time Calculation (min) (ACUTE ONLY): 25 min  Charges:  $Therapeutic Exercise: 8-22 mins $Therapeutic Activity: 8-22 mins                    G Codes:     Wayne Both, PT, DPT [phone]  Kreg Shropshire 03/02/2015, 5:28  PM

## 2015-03-02 NOTE — Clinical Social Work Placement (Signed)
   CLINICAL SOCIAL WORK PLACEMENT  NOTE  Date:  03/02/2015  Patient Details  Name: ANGELEE DREIS MRN: [id number] Date of Birth: 01-18-1924  Clinical Social Work is seeking post-discharge placement for this patient at the [place] level of care (*CSW will initial, date and re-position this form in  chart as items are completed):  Yes   Patient/family provided with [place] Work Department's list of facilities offering this level of care within the geographic area requested by the patient (or if unable, by the patient's family).  Yes   Patient/family informed of their freedom to choose among providers that offer the needed level of care, that participate in Medicare, Medicaid or managed care program needed by the patient, have an available bed and are willing to accept the patient.  Yes   Patient/family informed of [place]'s ownership interest in [facility] and [facility], as well as of the fact that they are under no obligation to receive care at these facilities.  PASRR submitted to EDS on 02/28/15     PASRR number received on 02/28/15     Existing PASRR number confirmed on       FL2 transmitted to all facilities in geographic area requested by pt/family on 02/28/15     FL2 transmitted to all facilities within larger geographic area on       Patient informed that his/her managed care company has contracts with or will negotiate with certain facilities, including the following:            Patient/family informed of bed offers received.  Patient chooses bed at  [facility])     Physician recommends and patient chooses bed at      Patient to be transferred to  [facility]) on 03/02/15.  Patient to be transferred to facility by  (EMS)     Patient family notified on 03/02/15 of transfer.  Name of family member notified:   (Granddaughter Christy)     PHYSICIAN       Additional Comment:     _______________________________________________ Maurine Cane, LCSW 03/02/2015, 9:18 AM

## 2015-03-02 NOTE — Progress Notes (Signed)
  Subjective: 3 Days Post-Op Procedure(s) (LRB): INTRAMEDULLARY (IM) NAIL INTERTROCHANTRIC (Left) Patient reports pain as mild and but became severe with ambulation yesterday with PT.Marland Kitchen   Patient is well, and has had no acute complaints or problems Continue with physical therapy today.  Plan is to go Rehab after hospital stay. no nausea and no vomiting Patient denies any chest pains or shortness of breath. Patient complaining of her restless leg syndrome given her problems on the night Objective: Vital signs in last 24 hours: Temp:  [98.3 F (36.8 C)-99.1 F (37.3 C)] 99.1 F (37.3 C) (12/17 0509) Pulse Rate:  [72-80] 80 (12/17 0509) Resp:  [18] 18 (12/17 0509) BP: (118-131)/(46-54) 127/54 mmHg (12/17 0509) SpO2:  [96 %-97 %] 96 % (12/17 0509) well approximated incision Heels are non tender and elevated off the bed using rolled towels Intake/Output from previous day: 12/16 0701 - 12/17 0700 In: 858 [P.O.:858] Out: 150 [Urine:150] Intake/Output this shift:     Recent Labs  02/28/15 0556 03/01/15 0418 03/02/15 0317  HGB 10.7* 9.1* 8.3*    Recent Labs  03/01/15 0418 03/02/15 0317  WBC 7.3 6.0  RBC 3.07* 2.77*  HCT 27.4* 25.0*  PLT 122* 118*    Recent Labs  02/28/15 0559 03/01/15 0418  NA 133* 136  K 3.6 3.5  CL 102 100*  CO2 25 27  BUN 16 18  CREATININE 0.78 0.82  GLUCOSE 121* 135*  CALCIUM 7.9* 8.0*   No results for input(s): LABPT, INR in the last 72 hours.  EXAM General - Patient is Alert, Appropriate and Oriented Extremity - Neurologically intact Neurovascular intact Sensation intact distally Intact pulses distally Dorsiflexion/Plantar flexion intact Dressing - dressing C/D/I Motor Function - intact, moving foot and toes well on exam.    Past Medical History  Diagnosis Date  . Arthritis   . Restless leg   . Overactive bladder   . Macular degeneration, age related   . HBP (high blood pressure)   . Spinal stenosis   . Osteoporosis      Assessment/Plan: 3 Days Post-Op Procedure(s) (LRB): INTRAMEDULLARY (IM) NAIL INTERTROCHANTRIC (Left) Active Problems:   Closed left hip fracture (HCC)  Estimated body mass index is 23.49 kg/(m^2) as calculated from the following:   Height as of this encounter: 5\' 7"  (1.702 m).   Weight as of this encounter: 68.04 kg (150 lb). Up with therapy Discharge to SNF  Labs: Were reviewed DVT Prophylaxis - Lovenox, Foot Pumps and TED hose Weight-Bearing as tolerated to left leg Patient needs to have a bowel movement today.  Pt given suppository this AM, pt had BM while rounding. Patient will need follow-up in [place] in 6 weeks with Dr. Haskel Khan will need to be removed from left hip 2 weeks postop and apply benzoin and half-inch Steri-Strips Continue Lovenox 40 mg daily for 14 days after discharge Change dressing as needed  [place]iza, PA-C [place] 03/02/2015, 8:53 AM

## 2015-03-02 NOTE — Progress Notes (Signed)
Patient has developed fever of 102. On my examination this morning she was doing well and had no complaints. Had hoped for discharge to skilled nursing but this has been discontinued. Chest x-ray shows atelectasis only, urinalysis is pending, blood cultures pending, treating fever with Tylenol. Hold off on antibiotics until source can be identified. She has no specific complaints. She does have some hypoxia which is new, oxygen saturation 88% on room air. Continue supplemental oxygen. Start incentive spirometry. She has been on appropriate DVT prophylaxis, however if no infectious source has been found will obtain lower extremity Dopplers due to her recent surgery.

## 2015-03-02 NOTE — Progress Notes (Signed)
Clinical Social Worker informed by Aldean Jewett, MD that patient is medically ready to discharge to SNF, Patient and  Granddaughter [name] are in a agreement with plan.  Call to SNF [facility] to confirm that patient's bed is ready. Provided patient's room number 208-A and number to call for report [phone] . All discharge information faxed to  Facility. Rx's added to discharge packet.   RN will call report and patient will discharge to [facility] via EMS.  Casimer Lanius. [place], MSW Clinical Social Work Department [phone] 9:06 AM

## 2015-03-02 NOTE — Discharge Instructions (Signed)
NSTRUCTIONS AFTER Surgery  o Remove items at home which could result in a fall. This includes throw rugs or furniture in walking pathways o ICE to the affected joint every three hours while awake for 30 minutes at a time, for at least the first 3-5 days, and then as needed for pain and swelling.  Continue to use ice for pain and swelling. You may notice swelling that will progress down to the foot and ankle.  This is normal after surgery.  Elevate your leg when you are not up walking on it.   o Continue to use the breathing machine you got in the hospital (incentive spirometer) which will help keep your temperature down.  It is common for your temperature to cycle up and down following surgery, especially at night when you are not up moving around and exerting yourself.  The breathing machine keeps your lungs expanded and your temperature down.  DIET:  As you were doing prior to hospitalization, we recommend a well-balanced diet.  DRESSING / WOUND CARE / SHOWERING Change dressing as needed.  Do not get the wound wet.  ACTIVITY  o Increase activity slowly as tolerated, but follow the weight bearing instructions below.   o No driving for 6 weeks or until further direction given by your physician.  You cannot drive while taking narcotics.  o No lifting or carrying greater than 10 lbs. until further directed by your surgeon. o Avoid periods of inactivity such as sitting longer than an hour when not asleep. This helps prevent blood clots.  o You may return to work once you are authorized by your doctor.   WEIGHT BEARING  Weight-bear as tolerated to the left lower extremity.  EXERCISES Per Physical Therapy.  CONSTIPATION  Constipation is defined medically as fewer than three stools per week and severe constipation as less than one stool per week.  Even if you have a regular bowel pattern at home, your normal regimen is likely to be disrupted due to multiple reasons following surgery.   Combination of anesthesia, postoperative narcotics, change in appetite and fluid intake all can affect your bowels.   YOU MUST use at least one of the following options; they are listed in order of increasing strength to get the job done.  They are all available over the counter, and you may need to use some, POSSIBLY even all of these options:    Drink plenty of fluids (prune juice may be helpful) and high fiber foods Colace 100 mg by mouth twice a day  Senokot for constipation as directed and as needed Dulcolax (bisacodyl), take with full glass of water  Miralax (polyethylene glycol) once or twice a day as needed.  If you have tried all these things and are unable to have a bowel movement in the first 3-4 days after surgery call either your surgeon or your primary doctor.    If you experience loose stools or diarrhea, hold the medications until you stool forms back up.  If your symptoms do not get better within 1 week or if they get worse, check with your doctor.  If you experience "the worst abdominal pain ever" or develop nausea or vomiting, please contact the office immediately for further recommendations for treatment.  ITCHING:  If you experience itching with your medications, try taking only a single pain pill, or even half a pain pill at a time.  You can also use Benadryl over the counter for itching or also to help with sleep.  TED HOSE STOCKINGS:  Use stockings on both legs until for at least 2 weeks or as directed by physician office. They may be removed at night for sleeping.  MEDICATIONS:  See your medication summary on the After Visit Summary that nursing will review with you.  You may have some home medications which will be placed on hold until you complete the course of blood thinner medication.  It is important for you to complete the blood thinner medication as prescribed.  PRECAUTIONS:  If you experience chest pain or shortness of breath - call 911 immediately for transfer  to the hospital emergency department.   If you develop a fever greater that 101 F, purulent drainage from wound, increased redness or drainage from wound, foul odor from the wound/dressing, or calf pain - CONTACT YOUR SURGEON.                                                   FOLLOW-UP APPOINTMENTS:  If you do not already have a post-op appointment, please call the office for an appointment to be seen by your surgeon.  Guidelines for how soon to be seen are listed in your After Visit Summary, but are typically between 1-4 weeks after surgery.  MAKE SURE YOU:   Understand these instructions.   Get help right away if you are not doing well or get worse.   Thank you for letting us be a part of your medical care team.  It is a privilege we respect greatly.  We hope these instructions will help you stay on track for a fast and full recovery!

## 2015-03-02 NOTE — Progress Notes (Addendum)
Patient was able to void without in/out cath so d/c'd that order. Urine collected and sent to lab.

## 2015-03-03 ENCOUNTER — Inpatient Hospital Stay: Payer: Medicare Other

## 2015-03-03 ENCOUNTER — Encounter: Payer: Self-pay | Admitting: Radiology

## 2015-03-03 LAB — BASIC METABOLIC PANEL
Anion gap: 6 (ref 5–15)
BUN: 18 mg/dL (ref 6–20)
CALCIUM: 7.8 mg/dL — AB (ref 8.9–10.3)
CO2: 29 mmol/L (ref 22–32)
CREATININE: 0.75 mg/dL (ref 0.44–1.00)
Chloride: 99 mmol/L — ABNORMAL LOW (ref 101–111)
GFR calc Af Amer: >60 mL/min (ref >60)
GLUCOSE: 123 mg/dL — AB (ref 65–99)
POTASSIUM: 3.4 mmol/L — AB (ref 3.5–5.1)
SODIUM: 134 mmol/L — AB (ref 135–145)

## 2015-03-03 LAB — CBC
HCT: 24.4 % — ABNORMAL LOW (ref 35.0–47.0)
Hemoglobin: 8.4 g/dL — ABNORMAL LOW (ref 12.0–16.0)
MCH: 30.7 pg (ref 26.0–34.0)
MCHC: 34.2 g/dL (ref 32.0–36.0)
MCV: 89.6 fL (ref 80.0–100.0)
PLATELETS: 136 10*3/uL — AB (ref 150–440)
RBC: 2.73 MIL/uL — AB (ref 3.80–5.20)
RDW: 13.5 % (ref 11.5–14.5)
WBC: 6.3 10*3/uL (ref 3.6–11.0)

## 2015-03-03 MED ORDER — IOHEXOL 350 MG/ML SOLN
75.0000 mL | Freq: Once | INTRAVENOUS | Status: AC | PRN
  Administered 2015-03-03: 75 mL via INTRAVENOUS

## 2015-03-03 MED ORDER — GUAIFENESIN 100 MG/5ML PO SOLN
5.0000 mL | ORAL | Status: DC | PRN
  Administered 2015-03-03 – 2015-03-04 (×2): 200 mg via ORAL
  Filled 2015-03-03 (×3): qty 10

## 2015-03-03 NOTE — Progress Notes (Signed)
[place] [facility] [place] NAME: Kristine Jacobson    MR#:  [id number]  DATE OF BIRTH:  08-16-1923  SUBJECTIVE:  CHIEF COMPLAINT:   Chief Complaint  Patient presents with  . Fall   Feeling well this morning. Tired, no specific complaints.  REVIEW OF SYSTEMS:   ROS  DRUG ALLERGIES:   Allergies  Allergen Reactions  . Codeine Rash    VITALS:  Blood pressure 125/46, pulse 84, temperature 102.7 F (39.3 C), temperature source Oral, resp. rate 18, height 5\' 7"  (1.702 m), weight 68.04 kg (150 lb), SpO2 94 %.  PHYSICAL EXAMINATION:  GENERAL:  81 y.o.-year-old patient sitting in chair with no acute distress.   LUNGS: Normal breath sounds bilaterally, no wheezing, rales,rhonchi or crepitation. No use of accessory muscles of respiration.  CARDIOVASCULAR: S1, S2 normal. No murmurs, rubs, or gallops.  ABDOMEN: Soft, nontender, nondistended. Bowel sounds present. No organomegaly or mass.  EXTREMITIES: No pedal edema, cyanosis, or clubbing.  NEUROLOGIC: Cranial nerves II through XII are grossly intact. Muscle strength 5/5 in all extremities. Sensation intact. Gait not checked.  PSYCHIATRIC: The patient is alert and oriented x 3.  SKIN: No obvious rash, lesion, or ulcer.    LABORATORY PANEL:   CBC  Recent Labs Lab 03/03/15 0735  WBC 6.3  HGB 8.4*  HCT 24.4*  PLT 136*   ------------------------------------------------------------------------------------------------------------------  Chemistries   Recent Labs Lab 02/27/15 0822  03/03/15 0735  NA 137  < > 134*  K 3.5  < > 3.4*  CL 103  < > 99*  CO2 25  < > 29  GLUCOSE 147*  < > 123*  BUN 21*  < > 18  CREATININE 0.68  < > 0.75  CALCIUM 9.3  < > 7.8*  AST 34  --   --   ALT 26  --   --   ALKPHOS 66  --   --   BILITOT 0.8  --   --   < > = values in this interval not  displayed. ------------------------------------------------------------------------------------------------------------------  Cardiac Enzymes  Recent Labs Lab 02/27/15 0822  TROPONINI <0.03   ------------------------------------------------------------------------------------------------------------------  RADIOLOGY:  Dg Chest 2 View  03/02/2015  CLINICAL DATA:  Shortness of breath, weakness, postop LEFT hip surgery Wednesday night EXAM: CHEST  2 VIEW COMPARISON:  02/27/2015 FINDINGS: Mild enlargement of cardiac silhouette. Atherosclerotic calcification aorta. Mediastinal contours and pulmonary vascularity normal. Chronic bronchitic changes. Subsegmental atelectasis LEFT base. No pleural effusion or pneumothorax. Diffuse osseous demineralization. IMPRESSION: Mild enlargement of cardiac silhouette. Bronchitic changes with subsegmental atelectasis LEFT base. Electronically Signed   By: Lavonia Dana M.D.   On: 03/02/2015 12:25   Ct Angio Chest Pe W/cm &/or Wo Cm  03/03/2015  CLINICAL DATA:  Short of breath for 1 day EXAM: CT ANGIOGRAPHY CHEST WITH CONTRAST TECHNIQUE: Multidetector CT imaging of the chest was performed using the standard protocol during bolus administration of intravenous contrast. Multiplanar CT image reconstructions and MIPs were obtained to evaluate the vascular anatomy. CONTRAST:  31mL OMNIPAQUE IOHEXOL 350 MG/ML SOLN COMPARISON:  None. FINDINGS: There are no filling defects in the pulmonary arterial tree to suggest acute pulmonary thromboembolism. No evidence of aortic dissection or aneurysm. Atherosclerotic calcifications are noted including within the coronary arteries. No abnormal mediastinal adenopathy. Small mediastinal nodes are noted. No pneumothorax.  Small bilateral pleural effusions. Subsegmental atelectasis towards the lung bases and in the dependent lower lobes. 8 mm left lower lobe pulmonary nodule on image 79.  Healing right antral lateral rib fracture on image 59 of  series 6. No vertebral compression deformity. Review of the MIP images confirms the above findings. IMPRESSION: No evidence of acute pulmonary thromboembolism 8 mm left lower lobe pulmonary nodule. If the patient is at high risk for bronchogenic carcinoma, follow-up chest CT at 3-46months is recommended. If the patient is at low risk for bronchogenic carcinoma, follow-up chest CT at 6-12 months is recommended. This recommendation follows the consensus statement: Guidelines for Management of Small Pulmonary Nodules Detected on CT Scans: A Statement from the [place] as published in Radiology 2005; 237:395-400. Small pleural effusions. Electronically Signed   By: Marybelle Killings M.D.   On: 03/03/2015 12:43   US Venous Img Lower Bilateral  03/03/2015  CLINICAL DATA:  Recent hip surgery. Left leg pain and edema. Fevers. Bilateral leg swelling. EXAM: BILATERAL LOWER EXTREMITY VENOUS DOPPLER ULTRASOUND TECHNIQUE: Gray-scale sonography with graded compression, as well as color Doppler and duplex ultrasound were performed to evaluate the lower extremity deep venous systems from the level of the common femoral vein and including the common femoral, femoral, profunda femoral, popliteal and calf veins including the posterior tibial, peroneal and gastrocnemius veins when visible. The superficial great saphenous vein was also interrogated. Spectral Doppler was utilized to evaluate flow at rest and with distal augmentation maneuvers in the common femoral, femoral and popliteal veins. COMPARISON:  None. FINDINGS: RIGHT LOWER EXTREMITY Common Femoral Vein: No evidence of thrombus. Normal compressibility, respiratory phasicity and response to augmentation. Saphenofemoral Junction: No evidence of thrombus. Normal compressibility and flow on color Doppler imaging. Profunda Femoral Vein: No evidence of thrombus. Normal compressibility and flow on color Doppler imaging. Femoral Vein: No evidence of thrombus. Normal  compressibility, respiratory phasicity and response to augmentation. Popliteal Vein: No evidence of thrombus. Normal compressibility, respiratory phasicity and response to augmentation. Calf Veins: No evidence of thrombus. Normal compressibility and flow on color Doppler imaging. Superficial Great Saphenous Vein: No evidence of thrombus. Normal compressibility and flow on color Doppler imaging. Venous Reflux:  None. Other Findings:  None. LEFT LOWER EXTREMITY Common Femoral Vein: No evidence of thrombus. Normal compressibility, respiratory phasicity and response to augmentation. Saphenofemoral Junction: No evidence of thrombus. Normal compressibility and flow on color Doppler imaging. Profunda Femoral Vein: No evidence of thrombus. Normal compressibility and flow on color Doppler imaging. Femoral Vein: No evidence of thrombus. Normal compressibility, respiratory phasicity and response to augmentation. Popliteal Vein: No evidence of thrombus. Normal compressibility, respiratory phasicity and response to augmentation. Calf Veins: No evidence of thrombus. Normal compressibility and flow on color Doppler imaging. Superficial Great Saphenous Vein: No evidence of thrombus. Normal compressibility and flow on color Doppler imaging. Venous Reflux:  None. Other Findings:  None. IMPRESSION: No evidence of deep venous thrombosis. Electronically Signed   By: Kerby Moors M.D.   On: 03/03/2015 16:20    EKG:   Orders placed or performed during the hospital encounter of 02/27/15  . ED EKG  . ED EKG  . EKG 12-Lead  . EKG 12-Lead    ASSESSMENT AND PLAN:    1.Left hip fracture closed: S/P ORIF on 12/14 by Dr Marry Guan. She will be discharged to [facility] skilled nursing facility. She will have Lovenox for 2 weeks per orthopedics protocol. She'll continue with physical therapy. Pain is fairly well controlled on oral pain medications, she should be encouraged to take medication prior to physical therapy sessions so that she  can participate fully.  #2 hypertension: Well controlled. Continue home  medications including carvedilol, losartan, hydrochlorothiazide.  #3 overactive bladder: continue home medications. No problems with this during the hospitalization.  #4 constipation: continue stool softeners. Encourage ambulation.  #5 restless leg syndrome: Continue Requip  #6 anemia: Hemoglobin should be checked on Monday 12/19 to ensure stability. No bleeding noted. No excessive swelling at the surgical site.  #7 fever: CTangio negative for PE or pneumonia. UA negative. Blood cultures NTD. If no further fevers will plan on dc in am.     All the records are reviewed and case discussed with Care Management/Social Workerr. Management plans discussed with the patient, family and they are in agreement.  CODE STATUS: full  TOTAL TIME TAKING CARE OF THIS PATIENT: 25 minutes.  Greater than 50% of time spent in care coordination and counseling. POSSIBLE D/C IN 1 DAYS, DEPENDING ON CLINICAL CONDITION.   Myrtis Ser M.D on 03/03/2015 at 7:55 PM  Between 7am to 6pm - Pager - [phone]  After 6pm go to [email or web address] - password [email or web address] [place] [facility]  Office  [phone]  CC: Primary care physician; Lavera Guise, MD

## 2015-03-03 NOTE — Progress Notes (Signed)
Notified Dr. Jannifer Franklin of patient's complaint that she has a cough with "something" stuck in her throat that she can not get up. Dr. Jannifer Franklin to place orders.

## 2015-03-03 NOTE — Progress Notes (Signed)
  Subjective: 4 Days Post-Op Procedure(s) (LRB): INTRAMEDULLARY (IM) NAIL INTERTROCHANTRIC (Left) Patient reports pain as mild and but became severe with ambulation yesterday with PT.Kristine Jacobson   Patient is well, but developed fever yesterday. Continue with physical therapy today.  Plan is to go Rehab after hospital stay. no nausea and no vomiting Patient developed fever of 102.5 yesterday.   Patient complaining of her restless leg syndrome given her problems on the night Objective: Vital signs in last 24 hours: Temp:  [98.3 F (36.8 C)-102.5 F (39.2 C)] 99.6 F (37.6 C) (12/18 0742) Pulse Rate:  [76-94] 76 (12/18 0742) Resp:  [16-20] 16 (12/18 0742) BP: (100-136)/(39-63) 129/46 mmHg (12/18 0742) SpO2:  [88 %-98 %] 97 % (12/18 0742) well approximated incision Heels are non tender and elevated off the bed using rolled towels Intake/Output from previous day: 12/17 0701 - 12/18 0700 In: 480 [P.O.:480] Out: -  Intake/Output this shift:     Recent Labs  03/01/15 0418 03/02/15 0317 03/03/15 0735  HGB 9.1* 8.3* 8.4*    Recent Labs  03/02/15 0317 03/03/15 0735  WBC 6.0 6.3  RBC 2.77* 2.73*  HCT 25.0* 24.4*  PLT 118* 136*    Recent Labs  03/01/15 0418 03/03/15 0735  NA 136 134*  K 3.5 3.4*  CL 100* 99*  CO2 27 29  BUN 18 18  CREATININE 0.82 0.75  GLUCOSE 135* 123*  CALCIUM 8.0* 7.8*   No results for input(s): LABPT, INR in the last 72 hours.  EXAM General - Patient is Alert, Appropriate and Oriented Extremity - Neurologically intact Neurovascular intact Sensation intact distally Intact pulses distally Dorsiflexion/Plantar flexion intact Dressing - scant drainage and the drainage is bloody. Motor Function - intact, moving foot and toes well on exam.    Past Medical History  Diagnosis Date  . Arthritis   . Restless leg   . Overactive bladder   . Macular degeneration, age related   . HBP (high blood pressure)   . Spinal stenosis   . Osteoporosis      Assessment/Plan: 4 Days Post-Op Procedure(s) (LRB): INTRAMEDULLARY (IM) NAIL INTERTROCHANTRIC (Left) Active Problems:   Closed left hip fracture (HCC)  Estimated body mass index is 23.49 kg/(m^2) as calculated from the following:   Height as of this encounter: 5\' 7"  (1.702 m).   Weight as of this encounter: 68.04 kg (150 lb). Up with therapy Discharge to SNF  Labs: Were reviewed, WBC 6.3 this AM, Hg stable at 8.4 DVT Prophylaxis - Lovenox, Foot Pumps and TED hose Weight-Bearing as tolerated to left leg Patient had a BM yesterday. Pt developed fever of 102.5 yesterday. Chest x-ray showed bronchitic changes with subsegmental atelectasis of the left base.  Encourage incentive spirometry. Left hip incision shows no signs of infection. UA has been ordered no bacteria seen, mucous present.  Patient will need follow-up in [place] in 6 weeks with Dr. Haskel Khan will need to be removed from left hip 2 weeks postop and apply benzoin and half-inch Steri-Strips Continue Lovenox 40 mg daily for 14 days after discharge Change dressing as needed  [place], PA-C [place] 03/03/2015, 8:58 AM

## 2015-03-04 ENCOUNTER — Inpatient Hospital Stay (HOSPITAL_COMMUNITY)
Admit: 2015-03-04 | Discharge: 2015-03-04 | Disposition: A | Discharge status: Home / Self Care | Payer: Medicare Other | Attending: Internal Medicine | Admitting: Internal Medicine

## 2015-03-04 DIAGNOSIS — R509 Fever, unspecified: Secondary | ICD-10-CM

## 2015-03-04 LAB — CBC
HEMATOCRIT: 23.5 % — AB (ref 35.0–47.0)
HEMOGLOBIN: 8 g/dL — AB (ref 12.0–16.0)
MCH: 30.4 pg (ref 26.0–34.0)
MCHC: 33.9 g/dL (ref 32.0–36.0)
MCV: 89.8 fL (ref 80.0–100.0)
PLATELETS: 151 10*3/uL (ref 150–440)
RBC: 2.62 MIL/uL — AB (ref 3.80–5.20)
RDW: 13.6 % (ref 11.5–14.5)
WBC: 5.8 10*3/uL (ref 3.6–11.0)

## 2015-03-04 LAB — BASIC METABOLIC PANEL
ANION GAP: 8 (ref 5–15)
BUN: 20 mg/dL (ref 6–20)
CALCIUM: 8.1 mg/dL — AB (ref 8.9–10.3)
CO2: 29 mmol/L (ref 22–32)
Chloride: 99 mmol/L — ABNORMAL LOW (ref 101–111)
Creatinine, Ser: 0.71 mg/dL (ref 0.44–1.00)
GFR calc Af Amer: >60 mL/min (ref >60)
GLUCOSE: 122 mg/dL — AB (ref 65–99)
POTASSIUM: 3.3 mmol/L — AB (ref 3.5–5.1)
SODIUM: 136 mmol/L (ref 135–145)

## 2015-03-04 LAB — HEPATIC FUNCTION PANEL
ALT: 16 U/L (ref 14–54)
AST: 27 U/L (ref 15–41)
Albumin: 2.5 g/dL — ABNORMAL LOW (ref 3.5–5.0)
Alkaline Phosphatase: 45 U/L (ref 38–126)
BILIRUBIN DIRECT: 0.1 mg/dL (ref 0.1–0.5)
BILIRUBIN INDIRECT: 0.4 mg/dL (ref 0.3–0.9)
TOTAL PROTEIN: 5.4 g/dL — AB (ref 6.5–8.1)
Total Bilirubin: 0.5 mg/dL (ref 0.3–1.2)

## 2015-03-04 LAB — URINE CULTURE

## 2015-03-04 LAB — PROCALCITONIN: Procalcitonin: 0.1 ng/mL

## 2015-03-04 MED ORDER — AMOXICILLIN-POT CLAVULANATE 875-125 MG PO TABS
1.0000 | ORAL_TABLET | Freq: Two times a day (BID) | ORAL | Status: DC
  Administered 2015-03-04 – 2015-03-05 (×2): 1 via ORAL
  Filled 2015-03-04 (×2): qty 1

## 2015-03-04 NOTE — Clinical Social Work Note (Signed)
Patient not to discharge today and [facility] will require a reauth now that the original Josem Kaufmann has expired (as of 17th). Reauth initiated today. Kim at [facility] updated. MD documented possible discharge in 1 day.  Shela Leff MSW,LCSW [phone]

## 2015-03-04 NOTE — Progress Notes (Signed)
Physical Therapy Treatment Patient Details Name: Kristine Jacobson MRN: [id number] DOB: June 17, 1923 Today's Date: 03/04/2015    History of Present Illness This patient is an 81 year old female who came to [facility] after a fall an suffering a left femur fracture. She recieved an ORIF repair.    PT Comments    Pt remains in chair from morning session comfortably and wishes to remain in chair post session. Participates in long sit and seated exercises with assist as needed. Sit to stand continues to be challenging requiring Mod A from recliner; mild increased pain, but patient does note fear.Continue to recommend 2 for transfer to bed/ambulation Encouraged continued strengthening exercises for improved functional mobility. Pt does note before fall she was noticing an increased bent knee position with ambulation.   Follow Up Recommendations  SNF     Equipment Recommendations       Recommendations for Other Services       Precautions / Restrictions Precautions Precautions: Fall Restrictions Weight Bearing Restrictions: Yes LLE Weight Bearing: Weight bearing as tolerated    Mobility  Bed Mobility Overal bed mobility: Needs Assistance Bed Mobility: Rolling;Supine to Sit Rolling: Min assist   Supine to sit: Mod assist     General bed mobility comments: Not tested; up in chair and wishes to stay up in chair   Transfers Overall transfer level: Needs assistance Equipment used: Rolling walker (2 wheeled) Transfers: Sit to/from Stand Sit to Stand: Mod assist;+2 physical assistance         General transfer comment: Difficulty achieving upright posititon and activatin quads and glutes for LE hip and knee extension.  Ambulation/Gait Ambulation/Gait assistance: Min assist;+2 physical assistance Ambulation Distance (Feet): 3 Feet Assistive device: Rolling walker (2 wheeled) Gait Pattern/deviations: Step-to pattern;Antalgic;Trunk flexed Gait velocity: Decreased Gait velocity  interpretation: <1.8 ft/sec, indicative of risk for recurrent falls General Gait Details: Occasionally minimally clears L foot, but primarily pivots feet to advance bed to chair.    Stairs            Wheelchair Mobility    Modified Rankin (Stroke Patients Only)       Balance   Sitting-balance support: No upper extremity supported Sitting balance-Leahy Scale: Fair (once assissted into sit position)     Standing balance support: Bilateral upper extremity supported Standing balance-Leahy Scale: Poor                      Cognition Arousal/Alertness: Awake/alert (Notes feeling tired) Behavior During Therapy: WFL for tasks assessed/performed Overall Cognitive Status: Within Functional Limits for tasks assessed                      Exercises General Exercises - Lower Extremity Ankle Circles/Pumps: AROM;Both;20 reps Quad Sets: Strengthening;Both;20 reps Gluteal Sets: Strengthening;Both;20 reps;Supine Short Arc Quad: AROM;Both;20 reps;Supine Long Arc Quad: AAROM;Left;20 reps;Seated Heel Slides: AAROM;Left;20 reps (R AROM) Hip ABduction/ADduction: AAROM;Left;20 reps (R AROM; heel supported) Straight Leg Raises: AAROM;10 reps;Supine;Both Hip Flexion/Marching: Strengthening;Left;Seated;20 reps    General Comments        Pertinent Vitals/Pain Pain Assessment: 0-10 Pain Score: 3  Pain Location: LLE Pain Intervention(s): Monitored during session    Home Living                      Prior Function            PT Goals (current goals can now be found in the care plan section) Progress towards PT goals: Progressing  toward goals (slowly)    Frequency  BID    PT Plan Current plan remains appropriate    Co-evaluation             End of Session Equipment Utilized During Treatment: Gait belt;Oxygen Activity Tolerance: Patient tolerated treatment well;Patient limited by pain (limited by fear) Patient left: in chair;with call bell/phone  within reach;with chair alarm set     Time: [id number] PT Time Calculation (min) (ACUTE ONLY): 35 min  Charges:  $Gait Training: 8-22 mins $Therapeutic Exercise: 8-22 mins $Therapeutic Activity: 8-22 mins                    G Codes:      Charlaine Dalton 03/04/2015, 2:07 PM

## 2015-03-04 NOTE — Progress Notes (Signed)
Physical Therapy Treatment Patient Details Name: Kristine Jacobson MRN: [id number] DOB: [birth year] Today's Date: 03/04/2015    History of Present Illness This patient is an 81 year old female who came to [facility] after a fall an suffering a left femur fracture. She recieved an ORIF repair.    PT Comments    Pt participates well with bed exercises; requiring assist for several exercises. Continues to require Mod A for up to edge of bed and Mod A of 2 for sit to stand. Primarily pivots feet to advance versus picking up feet, but able to occasionally demonstrate minimally clearing left foot. Continue PT to improve strength to improve functional mobility.   Follow Up Recommendations  SNF     Equipment Recommendations       Recommendations for Other Services       Precautions / Restrictions Precautions Precautions: Fall Restrictions Weight Bearing Restrictions: Yes LLE Weight Bearing: Weight bearing as tolerated    Mobility  Bed Mobility Overal bed mobility: Needs Assistance Bed Mobility: Rolling;Supine to Sit Rolling: Min assist   Supine to sit: Mod assist     General bed mobility comments: Requires assist for LLE and trunk and assist scooting to edge of bed. Increased time and effort as well wilth the assist  Transfers Overall transfer level: Needs assistance Equipment used: Rolling walker (2 wheeled) Transfers: Sit to/from Stand Sit to Stand: Mod assist;+2 physical assistance         General transfer comment: Difficulty achieving upright posititon and activatin quads and glutes for LE hip and knee extension.  Ambulation/Gait                 Stairs            Wheelchair Mobility    Modified Rankin (Stroke Patients Only)       Balance   Sitting-balance support: No upper extremity supported Sitting balance-Leahy Scale: Fair (once assissted into sit position)     Standing balance support: Bilateral upper extremity supported Standing balance-Leahy  Scale: Poor                      Cognition Arousal/Alertness: Awake/alert Behavior During Therapy: WFL for tasks assessed/performed Overall Cognitive Status: Within Functional Limits for tasks assessed                      Exercises General Exercises - Lower Extremity Ankle Circles/Pumps: AROM;Both;20 reps;Supine Quad Sets: Strengthening;Both;20 reps;Supine Gluteal Sets: Strengthening;Both;20 reps;Supine Short Arc Quad: AROM;Both;20 reps;Supine Long Arc Quad: AAROM;Left;20 reps;Seated Heel Slides: AAROM;Left;20 reps;Supine (AROM on R) Hip ABduction/ADduction: AAROM;Left;20 reps;Supine (AROM on R; leg supported) Straight Leg Raises: AAROM;10 reps;Supine;Both    General Comments        Pertinent Vitals/Pain Pain Assessment: 0-10 Pain Score: 5  Pain Location: LLE Pain Intervention(s): Limited activity within patient's tolerance;Monitored during session;Premedicated before session;Repositioned    Home Living                      Prior Function            PT Goals (current goals can now be found in the care plan section) Progress towards PT goals: Progressing toward goals (slowly)    Frequency  BID    PT Plan Current plan remains appropriate    Co-evaluation             End of Session Equipment Utilized During Treatment: Gait belt;Oxygen Activity Tolerance: Patient tolerated treatment well;Patient  limited by pain Patient left: in chair;with call bell/phone within reach;with chair alarm set     Time: 1051-1130 PT Time Calculation (min) (ACUTE ONLY): 39 min  Charges:  $Gait Training: 8-22 mins $Therapeutic Exercise: 8-22 mins $Therapeutic Activity: 8-22 mins                    G Codes:      Charlaine Dalton 03/04/2015, 11:31 AM

## 2015-03-04 NOTE — Progress Notes (Signed)
Orderly here to take patient for ECHO.

## 2015-03-04 NOTE — Progress Notes (Signed)
[facility] [place] NAME: Kristine Jacobson    MR#:  [id number]  DATE OF BIRTH:  [birth year]  SUBJECTIVE:  CHIEF COMPLAINT:   Chief Complaint  Patient presents with  . Fall   Eating breakfast. No specific complaints. Does have significant pain with PT  REVIEW OF SYSTEMS:   Review of Systems  Constitutional: Positive for fever. Negative for chills.  HENT: Positive for congestion and sore throat.   Respiratory: Positive for cough. Negative for shortness of breath and wheezing.   Cardiovascular: Negative for chest pain and palpitations.  Gastrointestinal: Negative for nausea, vomiting and abdominal pain.  Genitourinary: Negative for dysuria.  Musculoskeletal: Positive for joint pain.    DRUG ALLERGIES:   Allergies  Allergen Reactions  . Codeine Rash    VITALS:  Blood pressure 126/55, pulse 70, temperature 98.3 F (36.8 C), temperature source Oral, resp. rate 17, height 5\' 7"  (1.702 m), weight 68.04 kg (150 lb), SpO2 96 %.  PHYSICAL EXAMINATION:  GENERAL:  81 y.o.-year-old patient sitting up eating breakfast with no acute distress.   LUNGS: Normal breath sounds bilaterally, no wheezing, rales,rhonchi or crepitation. No use of accessory muscles of respiration.  CARDIOVASCULAR: S1, S2 normal. No murmurs, rubs, or gallops.  ABDOMEN: Soft, nontender, nondistended. Bowel sounds present. No organomegaly or mass.  EXTREMITIES: No pedal edema, cyanosis, or clubbing.  NEUROLOGIC: Cranial nerves II through XII are grossly intact. Muscle strength 5/5 in all extremities. Sensation intact. Gait not checked.  PSYCHIATRIC: The patient is alert and oriented x 3.  SKIN: No obvious rash, lesion, or ulcer.    LABORATORY PANEL:   CBC  Recent Labs Lab 03/04/15 0325  WBC 5.8  HGB 8.0*  HCT 23.5*  PLT 151   ------------------------------------------------------------------------------------------------------------------  Chemistries    Recent Labs Lab 02/27/15 0822  03/04/15 0325  NA 137  < > 136  K 3.5  < > 3.3*  CL 103  < > 99*  CO2 25  < > 29  GLUCOSE 147*  < > 122*  BUN 21*  < > 20  CREATININE 0.68  < > 0.71  CALCIUM 9.3  < > 8.1*  AST 34  --   --   ALT 26  --   --   ALKPHOS 66  --   --   BILITOT 0.8  --   --   < > = values in this interval not displayed. ------------------------------------------------------------------------------------------------------------------  Cardiac Enzymes  Recent Labs Lab 02/27/15 0822  TROPONINI <0.03   ------------------------------------------------------------------------------------------------------------------  RADIOLOGY:  Dg Chest 2 View  03/02/2015  CLINICAL DATA:  Shortness of breath, weakness, postop LEFT hip surgery Wednesday night EXAM: CHEST  2 VIEW COMPARISON:  02/27/2015 FINDINGS: Mild enlargement of cardiac silhouette. Atherosclerotic calcification aorta. Mediastinal contours and pulmonary vascularity normal. Chronic bronchitic changes. Subsegmental atelectasis LEFT base. No pleural effusion or pneumothorax. Diffuse osseous demineralization. IMPRESSION: Mild enlargement of cardiac silhouette. Bronchitic changes with subsegmental atelectasis LEFT base. Electronically Signed   By: Lavonia Dana M.D.   On: 03/02/2015 12:25   Ct Angio Chest Pe W/cm &/or Wo Cm  03/03/2015  CLINICAL DATA:  Short of breath for 1 day EXAM: CT ANGIOGRAPHY CHEST WITH CONTRAST TECHNIQUE: Multidetector CT imaging of the chest was performed using the standard protocol during bolus administration of intravenous contrast. Multiplanar CT image reconstructions and MIPs were obtained to evaluate the vascular anatomy. CONTRAST:  29mL OMNIPAQUE IOHEXOL 350 MG/ML SOLN COMPARISON:  None. FINDINGS: There are no  filling defects in the pulmonary arterial tree to suggest acute pulmonary thromboembolism. No evidence of aortic dissection or aneurysm. Atherosclerotic calcifications are noted including  within the coronary arteries. No abnormal mediastinal adenopathy. Small mediastinal nodes are noted. No pneumothorax.  Small bilateral pleural effusions. Subsegmental atelectasis towards the lung bases and in the dependent lower lobes. 8 mm left lower lobe pulmonary nodule on image 79. Healing right antral lateral rib fracture on image 59 of series 6. No vertebral compression deformity. Review of the MIP images confirms the above findings. IMPRESSION: No evidence of acute pulmonary thromboembolism 8 mm left lower lobe pulmonary nodule. If the patient is at high risk for bronchogenic carcinoma, follow-up chest CT at 3-55months is recommended. If the patient is at low risk for bronchogenic carcinoma, follow-up chest CT at 6-12 months is recommended. This recommendation follows the consensus statement: Guidelines for Management of Small Pulmonary Nodules Detected on CT Scans: A Statement from the [employer] as published in Radiology 2005; 237:395-400. Small pleural effusions. Electronically Signed   By: Marybelle Killings M.D.   On: 03/03/2015 12:43   US Venous Img Lower Bilateral  03/03/2015  CLINICAL DATA:  Recent hip surgery. Left leg pain and edema. Fevers. Bilateral leg swelling. EXAM: BILATERAL LOWER EXTREMITY VENOUS DOPPLER ULTRASOUND TECHNIQUE: Gray-scale sonography with graded compression, as well as color Doppler and duplex ultrasound were performed to evaluate the lower extremity deep venous systems from the level of the common femoral vein and including the common femoral, femoral, profunda femoral, popliteal and calf veins including the posterior tibial, peroneal and gastrocnemius veins when visible. The superficial great saphenous vein was also interrogated. Spectral Doppler was utilized to evaluate flow at rest and with distal augmentation maneuvers in the common femoral, femoral and popliteal veins. COMPARISON:  None. FINDINGS: RIGHT LOWER EXTREMITY Common Femoral Vein: No evidence of thrombus.  Normal compressibility, respiratory phasicity and response to augmentation. Saphenofemoral Junction: No evidence of thrombus. Normal compressibility and flow on color Doppler imaging. Profunda Femoral Vein: No evidence of thrombus. Normal compressibility and flow on color Doppler imaging. Femoral Vein: No evidence of thrombus. Normal compressibility, respiratory phasicity and response to augmentation. Popliteal Vein: No evidence of thrombus. Normal compressibility, respiratory phasicity and response to augmentation. Calf Veins: No evidence of thrombus. Normal compressibility and flow on color Doppler imaging. Superficial Great Saphenous Vein: No evidence of thrombus. Normal compressibility and flow on color Doppler imaging. Venous Reflux:  None. Other Findings:  None. LEFT LOWER EXTREMITY Common Femoral Vein: No evidence of thrombus. Normal compressibility, respiratory phasicity and response to augmentation. Saphenofemoral Junction: No evidence of thrombus. Normal compressibility and flow on color Doppler imaging. Profunda Femoral Vein: No evidence of thrombus. Normal compressibility and flow on color Doppler imaging. Femoral Vein: No evidence of thrombus. Normal compressibility, respiratory phasicity and response to augmentation. Popliteal Vein: No evidence of thrombus. Normal compressibility, respiratory phasicity and response to augmentation. Calf Veins: No evidence of thrombus. Normal compressibility and flow on color Doppler imaging. Superficial Great Saphenous Vein: No evidence of thrombus. Normal compressibility and flow on color Doppler imaging. Venous Reflux:  None. Other Findings:  None. IMPRESSION: No evidence of deep venous thrombosis. Electronically Signed   By: Kerby Moors M.D.   On: 03/03/2015 16:20    EKG:   Orders placed or performed during the hospital encounter of 02/27/15  . ED EKG  . ED EKG  . EKG 12-Lead  . EKG 12-Lead    ASSESSMENT AND PLAN:    1.Left hip fracture closed:  S/P  ORIF on 12/14 by Dr Marry Guan. She will be discharged to [facility] skilled nursing facility. She will have Lovenox for 2 weeks per orthopedics protocol. She'll continue with physical therapy. She should be encouraged to take medication prior to physical therapy sessions so that she can participate fully. Pain is impeding progress.  #2 hypertension: Well controlled. Continue home medications including carvedilol, losartan, hydrochlorothiazide.  #3 overactive bladder: continue home medications. incontinent.  #4 constipation: continue stool softeners. Encourage ambulation.  #5 restless leg syndrome: Continue Requip  #6 anemia:  - hgb slowly trending down - no need to transfuse, no evidence of over bleeing  #7 fever:  - up to 102 on 12/18 abd 12/17 - UA (-) - CTA and dopplers (-) - blood cultures NTD - only complaint is sore throat. ? Viral URI? - ECHO pending - ortho has examined the wound and joint and feel that there is no infection - consult ID  All the records are reviewed and case discussed with Care Management/Social Workerr. Management plans discussed with the patient, family and they are in agreement.  CODE STATUS: full  TOTAL TIME TAKING CARE OF THIS PATIENT: 25 minutes.  Greater than 50% of time spent in care coordination and counseling. POSSIBLE D/C IN 1 DAYS, DEPENDING ON CLINICAL CONDITION.   Myrtis Ser M.D on 03/04/2015 at 9:47 AM  Between 7am to 6pm - Pager - [phone]  After 6pm go to [email or web address] - password [email or web address] [facility]  Office  [phone]  CC: Primary care physician; Lavera Guise, MD

## 2015-03-04 NOTE — Progress Notes (Signed)
Patient slept the entire shift. Offered patient pain medication throughout the shift and patient refused but will ask for meds before physical therapy. Tylenol given for fever spike with positive results. Oxygen still in use at 2 liters. Patient voiding incontinent. Dressing intact

## 2015-03-04 NOTE — Care Management Note (Signed)
Case Management Note  Patient Details  Name: Kristine Jacobson MRN: [id number] Date of Birth: [birth year]  Subjective/Objective:      Spiked temp 102 on 03/03/15. Currently pending an I&D consult. Still having significant hip pain. Plan is to discharge back to [facility].               Action/Plan:   Expected Discharge Date:                  Expected Discharge Plan:     In-House Referral:     Discharge planning Services     Post Acute Care Choice:    Choice offered to:     DME Arranged:    DME Agency:     HH Arranged:    [place] Agency:     Status of Service:     Medicare Important Message Given:  Yes Date Medicare IM Given:    Medicare IM give by:    Date Additional Medicare IM Given:    Additional Medicare Important Message give by:     If discussed at [place] of Stay Meetings, dates discussed:    Additional Comments:  Sonakshi Rolland A, RN 03/04/2015, 4:35 PM

## 2015-03-04 NOTE — Progress Notes (Addendum)
  Subjective: 5 Days Post-Op Procedure(s) (LRB): INTRAMEDULLARY (IM) NAIL INTERTROCHANTRIC (Left) Patient reports pain as mild, and is slowly improving with physical therapy  Patient is well with mild pain. Continue with physical therapy today.  Plan is to go Rehab after hospital stay. Possible today no nausea and no vomiting Patient has improving fever.   Patient complaining of her restless leg syndrome given her problems on the night Objective: Vital signs in last 24 hours: Temp:  [98.2 F (36.8 C)-102.7 F (39.3 C)] 98.2 F (36.8 C) (12/19 0430) Pulse Rate:  [68-84] 68 (12/19 0430) Resp:  [16-18] 16 (12/19 0430) BP: (112-138)/(39-89) 112/89 mmHg (12/19 0430) SpO2:  [94 %-98 %] 95 % (12/19 0430) well approximated incision with very minimal clear drainage at the superior incision. A new dressing was applied. Signs of infection. Heels are non tender and elevated off the bed using rolled towels Intake/Output from previous day: 12/18 0701 - 12/19 0700 In: 540 [P.O.:540] Out: 375 [Urine:375] Intake/Output this shift:     Recent Labs  03/02/15 0317 03/03/15 0735 03/04/15 0325  HGB 8.3* 8.4* 8.0*    Recent Labs  03/03/15 0735 03/04/15 0325  WBC 6.3 5.8  RBC 2.73* 2.62*  HCT 24.4* 23.5*  PLT 136* 151    Recent Labs  03/03/15 0735 03/04/15 0325  NA 134* 136  K 3.4* 3.3*  CL 99* 99*  CO2 29 29  BUN 18 20  CREATININE 0.75 0.71  GLUCOSE 123* 122*  CALCIUM 7.8* 8.1*   No results for input(s): LABPT, INR in the last 72 hours.  EXAM General - Patient is Alert, Appropriate and Oriented Extremity - Neurologically intact Neurovascular intact Sensation intact distally Intact pulses distally Dorsiflexion/Plantar flexion intact Dressing - scant drainage and the drainage is bloody. New Honeycomb dressing applied. Motor Function - intact, moving foot and toes well on exam.    Past Medical History  Diagnosis Date  . Arthritis   . Restless leg   . Overactive  bladder   . Macular degeneration, age related   . HBP (high blood pressure)   . Spinal stenosis   . Osteoporosis     Assessment/Plan: 5 Days Post-Op Procedure(s) (LRB): INTRAMEDULLARY (IM) NAIL INTERTROCHANTRIC (Left) Active Problems:   Closed left hip fracture (HCC)  Estimated body mass index is 23.49 kg/(m^2) as calculated from the following:   Height as of this encounter: 5\' 7"  (1.702 m).   Weight as of this encounter: 68.04 kg (150 lb). Up with therapy Discharge to SNF  Labs: Were reviewed, WBC 6.3 this AM, Hg stable at 8.4 DVT Prophylaxis - Lovenox, Foot Pumps and TED hose Weight-Bearing as tolerated to left leg Patient had a BM yesterday. Left hip incision shows no signs of infection.   Patient will need follow-up in [place] in 6 weeks with Dr. Haskel Khan will need to be removed from left hip 2 weeks postop and apply benzoin and half-inch Steri-Strips Continue Lovenox 40 mg daily for 14 days after discharge Change dressing as needed  Reche Dixon PA-C [place] 03/04/2015, 7:24 AM

## 2015-03-04 NOTE — Care Management Important Message (Signed)
Important Message  Patient Details  Name: AZJAH ZIOLKOWSKI MRN: [id number] Date of Birth: April 30, 1923   Medicare Important Message Given:  Yes    Fredonia Casalino A, RN 03/04/2015, 7:52 AM

## 2015-03-04 NOTE — Progress Notes (Signed)
Spoke with Dr.Walsh regarding patient's potassium level of 3.3.  MD to take a look at labs.

## 2015-03-04 NOTE — Consult Note (Signed)
[facility] Infectious Disease     Reason for Consult: Fever    Referring Physician: Volanda Napoleon Date of Admission:  02/27/2015   Active Problems:   Closed left hip fracture (HCC)   HPI: Kristine Jacobson is a 81 y.o. female with hx OA, htn admitted 12.14.16 after a fall, leading to acute left intertrochanteric fracture. She on admit had no fever, wbc 11.2. Underwent ORIF 12/14. Remained afebrile until 12/17 when developed fever to 102.5/wbc 6. CX done 12.17 neg and ua neg, ucx neg.  Only abx was periop cefazolin 12/14-15.  Doppler neg, CT chest neg for PE or infiltrate. Ortho does not feel hip incision is infected but some mild bloody drainage at superior point.  She denies abd pain, nv, dysuria. Does have some nasal congestion, mild cough, mucus in back of throat with post nasal drip and mild HA but no facial tenderness.   Past Medical History  Diagnosis Date  . Arthritis   . Restless leg   . Overactive bladder   . Macular degeneration, age related   . HBP (high blood pressure)   . Spinal stenosis   . Osteoporosis    Past Surgical History  Procedure Laterality Date  . Abdominal hysterectomy    . Tonsillectomy    . Bladder surgery      bladder suspension  . Esophageal dilitation    . Intramedullary (im) nail intertrochanteric Left 02/27/2015    Procedure: INTRAMEDULLARY (IM) NAIL INTERTROCHANTRIC;  Surgeon: Dereck Leep, MD;  Location: [facility] ORS;  Service: Orthopedics;  Laterality: Left;   Social History  Substance Use Topics  . Smoking status: Never Smoker   . Smokeless tobacco: None  . Alcohol Use: No   Family History  Problem Relation Age of Onset  . Diabetes Mother     Allergies:  Allergies  Allergen Reactions  . Codeine Rash    Current antibiotics: Antibiotics Given (last 72 hours)    None      MEDICATIONS: . amLODipine  5 mg Oral Daily  . antiseptic oral rinse  7 mL Mouth Rinse BID  . carvedilol  3.125 mg Oral BID WC  . enoxaparin (LOVENOX) injection  40  mg Subcutaneous Q24H  . ferrous sulfate  325 mg Oral BID WC  . gabapentin  100 mg Oral TID  . losartan  100 mg Oral Daily   And  . hydrochlorothiazide  12.5 mg Oral Daily  . Linaclotide  145 mcg Oral Once per day on Mon Thu  . pantoprazole  40 mg Oral BID  . rOPINIRole  0.5 mg Oral TID  . senna-docusate  1 tablet Oral BID    Review of Systems - 11 systems reviewed and negative per HPI   OBJECTIVE: Temp:  [98.2 F (36.8 C)-102.7 F (39.3 C)] 98.4 F (36.9 C) (12/19 1532) Pulse Rate:  [65-84] 65 (12/19 1532) Resp:  [16-18] 18 (12/19 1532) BP: (112-131)/(46-89) 121/53 mmHg (12/19 1532) SpO2:  [94 %-100 %] 100 % (12/19 1532) Physical Exam  Constitutional:  oriented to person, place, and time. appears well-developed and well-nourished. No distress.  HENT: [place]/AT, PERRLA, no scleral icterus Mouth/Throat: Oropharynx is clear/ + mucus drainage in back of throat Cardiovascular: Normal rate, regular rhythm and normal heart sounds. Exam reveals no gallop and no friction rub.  Pulmonary/Chest: mild rhonchi bil Neck  supple, no nuchal rigidity Abdominal: Soft. Bowel sounds are normal.  exhibits no distension. There is no tenderness.  Lymphadenopathy: no cervical adenopathy. No axillary adenopathy Neurological: alert and  oriented to person, place, and time.  Skin: L hip site covered with honeycomb dressing. Has expected post op induration , mild bloody drainage supreiorly Psychiatric: a normal mood and affect.  behavior is normal.    LABS: Results for orders placed or performed during the hospital encounter of 02/27/15 (from the past 48 hour(s))  Urinalysis complete, with microscopic ([facility] only)     Status: Abnormal   Collection Time: 03/02/15  5:36 PM  Result Value Ref Range   Color, Urine YELLOW (A) YELLOW   APPearance HAZY (A) CLEAR   Glucose, UA NEGATIVE NEGATIVE mg/dL   Bilirubin Urine NEGATIVE NEGATIVE   Ketones, ur NEGATIVE NEGATIVE mg/dL   Specific Gravity, Urine 1.013 1.005  - 1.030   Hgb urine dipstick 1+ (A) NEGATIVE   pH 5.0 5.0 - 8.0   Protein, ur NEGATIVE NEGATIVE mg/dL   Nitrite NEGATIVE NEGATIVE   Leukocytes, UA NEGATIVE NEGATIVE   RBC / HPF 0-5 0 - 5 RBC/hpf   WBC, UA 0-5 0 - 5 WBC/hpf   Bacteria, UA NONE SEEN NONE SEEN   Squamous Epithelial / LPF 0-5 (A) NONE SEEN   Mucous PRESENT   Urine culture     Status: None   Collection Time: 03/02/15  5:36 PM  Result Value Ref Range   Specimen Description URINE, RANDOM    Special Requests NONE    Culture MULTIPLE SPECIES PRESENT, SUGGEST RECOLLECTION    Report Status 03/04/2015 FINAL   CBC     Status: Abnormal   Collection Time: 03/03/15  7:35 AM  Result Value Ref Range   WBC 6.3 3.6 - 11.0 K/uL   RBC 2.73 (L) 3.80 - 5.20 MIL/uL   Hemoglobin 8.4 (L) 12.0 - 16.0 g/dL   HCT 24.4 (L) 35.0 - 47.0 %   MCV 89.6 80.0 - 100.0 fL   MCH 30.7 26.0 - 34.0 pg   MCHC 34.2 32.0 - 36.0 g/dL   RDW 13.5 11.5 - 14.5 %   Platelets 136 (L) 150 - 440 K/uL  Basic metabolic panel     Status: Abnormal   Collection Time: 03/03/15  7:35 AM  Result Value Ref Range   Sodium 134 (L) 135 - 145 mmol/L   Potassium 3.4 (L) 3.5 - 5.1 mmol/L   Chloride 99 (L) 101 - 111 mmol/L   CO2 29 22 - 32 mmol/L   Glucose, Bld 123 (H) 65 - 99 mg/dL   BUN 18 6 - 20 mg/dL   Creatinine, Ser 0.75 0.44 - 1.00 mg/dL   Calcium 7.8 (L) 8.9 - 10.3 mg/dL   GFR calc non Af Amer >60 >60 mL/min   GFR calc Af Amer >60 >60 mL/min    Comment: (NOTE) The eGFR has been calculated using the CKD EPI equation. This calculation has not been validated in all clinical situations. eGFR's persistently <60 mL/min signify possible Chronic Kidney Disease.    Anion gap 6 5 - 15  CBC     Status: Abnormal   Collection Time: 03/04/15  3:25 AM  Result Value Ref Range   WBC 5.8 3.6 - 11.0 K/uL   RBC 2.62 (L) 3.80 - 5.20 MIL/uL   Hemoglobin 8.0 (L) 12.0 - 16.0 g/dL   HCT 23.5 (L) 35.0 - 47.0 %   MCV 89.8 80.0 - 100.0 fL   MCH 30.4 26.0 - 34.0 pg   MCHC 33.9 32.0  - 36.0 g/dL   RDW 13.6 11.5 - 14.5 %   Platelets 151 150 - 440  K/uL  Basic metabolic panel     Status: Abnormal   Collection Time: 03/04/15  3:25 AM  Result Value Ref Range   Sodium 136 135 - 145 mmol/L   Potassium 3.3 (L) 3.5 - 5.1 mmol/L   Chloride 99 (L) 101 - 111 mmol/L   CO2 29 22 - 32 mmol/L   Glucose, Bld 122 (H) 65 - 99 mg/dL   BUN 20 6 - 20 mg/dL   Creatinine, Ser 0.71 0.44 - 1.00 mg/dL   Calcium 8.1 (L) 8.9 - 10.3 mg/dL   GFR calc non Af Amer >60 >60 mL/min   GFR calc Af Amer >60 >60 mL/min    Comment: (NOTE) The eGFR has been calculated using the CKD EPI equation. This calculation has not been validated in all clinical situations. eGFR's persistently <60 mL/min signify possible Chronic Kidney Disease.    Anion gap 8 5 - 15  Hepatic function panel     Status: Abnormal   Collection Time: 03/04/15  3:25 AM  Result Value Ref Range   Total Protein 5.4 (L) 6.5 - 8.1 g/dL   Albumin 2.5 (L) 3.5 - 5.0 g/dL   AST 27 15 - 41 U/L   ALT 16 14 - 54 U/L   Alkaline Phosphatase 45 38 - 126 U/L   Total Bilirubin 0.5 0.3 - 1.2 mg/dL   Bilirubin, Direct 0.1 0.1 - 0.5 mg/dL   Indirect Bilirubin 0.4 0.3 - 0.9 mg/dL   No components found for: ESR, C REACTIVE PROTEIN MICRO: Recent Results (from the past 720 hour(s))  Surgical pcr screen     Status: None   Collection Time: 02/27/15 11:47 AM  Result Value Ref Range Status   MRSA, PCR NEGATIVE NEGATIVE Final   Staphylococcus aureus NEGATIVE NEGATIVE Final    Comment:        The Xpert SA Assay (FDA approved for NASAL specimens in patients over 21 years of age), is one component of a comprehensive surveillance program.  Test performance has been validated by [facility] for patients greater than or equal to 47 year old. It is not intended to diagnose infection nor to guide or monitor treatment.   Culture, blood (Routine X 2) w Reflex to ID Panel     Status: None (Preliminary result)   Collection Time: 03/02/15 11:44 AM  Result  Value Ref Range Status   Specimen Description BLOOD LEFT AC  Final   Special Requests   Final    BOTTLES DRAWN AEROBIC AND ANAEROBIC  AER 4CC ANA 1CC   Culture NO GROWTH 2 DAYS  Final   Report Status PENDING  Incomplete  Culture, blood (Routine X 2) w Reflex to ID Panel     Status: None (Preliminary result)   Collection Time: 03/02/15 11:56 AM  Result Value Ref Range Status   Specimen Description BLOOD RIGHT HAND  Final   Special Requests BOTTLES DRAWN AEROBIC AND ANAEROBIC  6CC  Final   Culture NO GROWTH 2 DAYS  Final   Report Status PENDING  Incomplete  Urine culture     Status: None   Collection Time: 03/02/15  5:36 PM  Result Value Ref Range Status   Specimen Description URINE, RANDOM  Final   Special Requests NONE  Final   Culture MULTIPLE SPECIES PRESENT, SUGGEST RECOLLECTION  Final   Report Status 03/04/2015 FINAL  Final    IMAGING: Dg Chest 1 View  02/27/2015  CLINICAL DATA:  Preoperative examination (hip fracture) EXAM: CHEST 1 VIEW COMPARISON:  None. FINDINGS: Borderline enlarged cardiac silhouette and mediastinal contours with tortuosity and possible mild ectasia of the thoracic aorta. Atherosclerotic plaque within the thoracic aorta. Partially calcified bilateral hilar lymph nodes, the sequela of prior granulomatous infection. There is mild diffuse slightly nodular thickening of the pulmonary station. Minimal left basilar linear heterogeneous opacities favored to represent atelectasis. No pleural effusion or pneumothorax. No definite evidence of edema. No definite acute osseous abnormalities. IMPRESSION: 1. Cardiomegaly and atherosclerosis without definite acute cardiopulmonary disease. 2. Partially calcified bilateral hilar lymph nodes, the sequela of prior granulomatous infection. Electronically Signed   By: Sandi Mariscal M.D.   On: 02/27/2015 09:03   Dg Chest 2 View  03/02/2015  CLINICAL DATA:  Shortness of breath, weakness, postop LEFT hip surgery Wednesday night EXAM:  CHEST  2 VIEW COMPARISON:  02/27/2015 FINDINGS: Mild enlargement of cardiac silhouette. Atherosclerotic calcification aorta. Mediastinal contours and pulmonary vascularity normal. Chronic bronchitic changes. Subsegmental atelectasis LEFT base. No pleural effusion or pneumothorax. Diffuse osseous demineralization. IMPRESSION: Mild enlargement of cardiac silhouette. Bronchitic changes with subsegmental atelectasis LEFT base. Electronically Signed   By: Lavonia Dana M.D.   On: 03/02/2015 12:25   Ct Angio Chest Pe W/cm &/or Wo Cm  03/03/2015  CLINICAL DATA:  Short of breath for 1 day EXAM: CT ANGIOGRAPHY CHEST WITH CONTRAST TECHNIQUE: Multidetector CT imaging of the chest was performed using the standard protocol during bolus administration of intravenous contrast. Multiplanar CT image reconstructions and MIPs were obtained to evaluate the vascular anatomy. CONTRAST:  41m OMNIPAQUE IOHEXOL 350 MG/ML SOLN COMPARISON:  None. FINDINGS: There are no filling defects in the pulmonary arterial tree to suggest acute pulmonary thromboembolism. No evidence of aortic dissection or aneurysm. Atherosclerotic calcifications are noted including within the coronary arteries. No abnormal mediastinal adenopathy. Small mediastinal nodes are noted. No pneumothorax.  Small bilateral pleural effusions. Subsegmental atelectasis towards the lung bases and in the dependent lower lobes. 8 mm left lower lobe pulmonary nodule on image 79. Healing right antral lateral rib fracture on image 59 of series 6. No vertebral compression deformity. Review of the MIP images confirms the above findings. IMPRESSION: No evidence of acute pulmonary thromboembolism 8 mm left lower lobe pulmonary nodule. If the patient is at high risk for bronchogenic carcinoma, follow-up chest CT at [phone] recommended. If the patient is at low risk for bronchogenic carcinoma, follow-up chest CT at 6-12 months is recommended. This recommendation follows the consensus  statement: Guidelines for Management of Small Pulmonary Nodules Detected on CT Scans: A Statement from the [employer] published in Radiology 2005; 237:395-400. Small pleural effusions. Electronically Signed   By: ArMarybelle Killings.D.   On: 03/03/2015 12:43   Nm Hepato W/eject Fract  02/05/2015  CLINICAL DATA:  Right upper quadrant pain with nausea and vomiting EXAM: NUCLEAR MEDICINE HEPATOBILIARY IMAGING WITH GALLBLADDER EF Views: Anterior right upper quadrant Radionuclide:  Technetium 992mbrofenin Dose:  5.04.680llicurie Route of administration: Intravenous COMPARISON:  None. FINDINGS: Liver uptake of radiotracer is normal. There is prompt visualization of gallbladder and small bowel, indicating patency of the cystic and common bile ducts. A weight based dose, 1.36 mcg, of sincalide was administered intravenously with calculation of the computer generated ejection fraction of radiotracer from the gallbladder. The patient did not complain of clinical symptoms with the sincalide administration. The computer generated ejection fraction of radiotracer from the gallbladder is normal at 54%, normal greater than 38%. IMPRESSION: Study within normal limits. Electronically Signed   By: WilGwyndolyn Saxon  Jasmine December III M.D.   On: 02/05/2015 14:42   US Venous Img Lower Bilateral  03/03/2015  CLINICAL DATA:  Recent hip surgery. Left leg pain and edema. Fevers. Bilateral leg swelling. EXAM: BILATERAL LOWER EXTREMITY VENOUS DOPPLER ULTRASOUND TECHNIQUE: Gray-scale sonography with graded compression, as well as color Doppler and duplex ultrasound were performed to evaluate the lower extremity deep venous systems from the level of the common femoral vein and including the common femoral, femoral, profunda femoral, popliteal and calf veins including the posterior tibial, peroneal and gastrocnemius veins when visible. The superficial great saphenous vein was also interrogated. Spectral Doppler was utilized to evaluate flow at  rest and with distal augmentation maneuvers in the common femoral, femoral and popliteal veins. COMPARISON:  None. FINDINGS: RIGHT LOWER EXTREMITY Common Femoral Vein: No evidence of thrombus. Normal compressibility, respiratory phasicity and response to augmentation. Saphenofemoral Junction: No evidence of thrombus. Normal compressibility and flow on color Doppler imaging. Profunda Femoral Vein: No evidence of thrombus. Normal compressibility and flow on color Doppler imaging. Femoral Vein: No evidence of thrombus. Normal compressibility, respiratory phasicity and response to augmentation. Popliteal Vein: No evidence of thrombus. Normal compressibility, respiratory phasicity and response to augmentation. Calf Veins: No evidence of thrombus. Normal compressibility and flow on color Doppler imaging. Superficial Great Saphenous Vein: No evidence of thrombus. Normal compressibility and flow on color Doppler imaging. Venous Reflux:  None. Other Findings:  None. LEFT LOWER EXTREMITY Common Femoral Vein: No evidence of thrombus. Normal compressibility, respiratory phasicity and response to augmentation. Saphenofemoral Junction: No evidence of thrombus. Normal compressibility and flow on color Doppler imaging. Profunda Femoral Vein: No evidence of thrombus. Normal compressibility and flow on color Doppler imaging. Femoral Vein: No evidence of thrombus. Normal compressibility, respiratory phasicity and response to augmentation. Popliteal Vein: No evidence of thrombus. Normal compressibility, respiratory phasicity and response to augmentation. Calf Veins: No evidence of thrombus. Normal compressibility and flow on color Doppler imaging. Superficial Great Saphenous Vein: No evidence of thrombus. Normal compressibility and flow on color Doppler imaging. Venous Reflux:  None. Other Findings:  None. IMPRESSION: No evidence of deep venous thrombosis. Electronically Signed   By: Kerby Moors M.D.   On: 03/03/2015 16:20   Dg  C-arm 1-60 Min  02/27/2015  CLINICAL DATA:  Left hip nailing in the OR. EXAM: OPERATIVE left HIP (WITH PELVIS IF PERFORMED) 4 VIEWS TECHNIQUE: Fluoroscopic spot image(s) were submitted for interpretation post-operatively. COMPARISON:  02/27/2015 FINDINGS: Intraoperative fluoroscopy is utilized for surgical control purposes demonstrating internal fixation of a left hip fracture. Fluoroscopy time is recorded at 1 minute 0 seconds. Cumulative area dose product is 146.36 cGy per cm square. Cumulative air kerma is 10.13 mGy. Four spot fluoroscopic images are obtained. Spot fluoroscopic images of the left hip obtained demonstrate internal fixation of an intertrochanteric fracture of the proximal left femur using intra medullary rod and compression bolt. There is a distal locking screw at the distal rod. Fracture fragments appear in near anatomic alignment and position. No dislocation of the hip is demonstrated. IMPRESSION: Intraoperative fluoroscopy is utilized for surgical control purposes demonstrating internal fixation of left hip fracture. Electronically Signed   By: Lucienne Capers M.D.   On: 02/27/2015 22:46   Dg Hip Operative Unilat With Pelvis Left  02/27/2015  CLINICAL DATA:  Left hip nailing in the OR. EXAM: OPERATIVE left HIP (WITH PELVIS IF PERFORMED) 4 VIEWS TECHNIQUE: Fluoroscopic spot image(s) were submitted for interpretation post-operatively. COMPARISON:  02/27/2015 FINDINGS: Intraoperative fluoroscopy is utilized for surgical control purposes  demonstrating internal fixation of a left hip fracture. Fluoroscopy time is recorded at 1 minute 0 seconds. Cumulative area dose product is 146.36 cGy per cm square. Cumulative air kerma is 10.13 mGy. Four spot fluoroscopic images are obtained. Spot fluoroscopic images of the left hip obtained demonstrate internal fixation of an intertrochanteric fracture of the proximal left femur using intra medullary rod and compression bolt. There is a distal locking  screw at the distal rod. Fracture fragments appear in near anatomic alignment and position. No dislocation of the hip is demonstrated. IMPRESSION: Intraoperative fluoroscopy is utilized for surgical control purposes demonstrating internal fixation of left hip fracture. Electronically Signed   By: Lucienne Capers M.D.   On: 02/27/2015 22:46   Dg Hip Unilat With Pelvis 2-3 Views Left  02/27/2015  CLINICAL DATA:  Fall at 2:30 this morning.  Unable to move leg. EXAM: DG HIP (WITH OR WITHOUT PELVIS) 2-3V LEFT COMPARISON:  None. FINDINGS: Acute left intertrochanteric fracture with mild varus angulation. Mild degenerative spurring of the acetabula bilaterally. No other fractures of the bony pelvis identified. IMPRESSION: 1. Acute left intertrochanteric fracture with mild varus angulation. Electronically Signed   By: Van Clines M.D.   On: 02/27/2015 09:03    Assessment:   GHAZAL PEVEY is a 81 y.o. female with hx OA, htn admitted 12.14.16 after a fall, leading to acute left intertrochanteric fracture. She on admit had no fever, wbc 11.2. Underwent ORIF 12/14. Remained afebrile until 12/17 when developed fever to 102.5/wbc 6. CX done 12.17 neg and ua neg, ucx neg.  Only abx was periop cefazolin 12/14-15.  Doppler neg, CT chest neg for PE or infiltrate. MRSA PCR negative. Ortho does not feel hip incision is infected but some mild bloody drainage at superior point.  Does have some nasal congestion, mild cough, mucus in back of throat with post nasal drip and mild HA but no facial tenderness.  Possible sources include URI but would not expect high fevers without sinusitis or incision site infection.  Her WBC is nml  Recommendations Check procalcitonin level Would start augmentin for possible sinus infection x 10 day course Close follow up of her incision site.  Thank you very much for allowing me to participate in the care of this patient. Please call with questions.   Cheral Marker. Ola Spurr, MD

## 2015-03-04 NOTE — Progress Notes (Signed)
*  PRELIMINARY RESULTS* Echocardiogram 2D Echocardiogram has been performed.  Kristine Jacobson 03/04/2015, 8:48 AM

## 2015-03-05 MED ORDER — GUAIFENESIN 100 MG/5ML PO SOLN: 5.0000 mL | ORAL | Status: DC | PRN

## 2015-03-05 MED ORDER — AMOXICILLIN-POT CLAVULANATE 875-125 MG PO TABS: 1.0000 | ORAL_TABLET | Freq: Two times a day (BID) | ORAL | Status: AC

## 2015-03-05 NOTE — Progress Notes (Signed)
[facility] INFECTIOUS DISEASE PROGRESS NOTE Date of Admission:  02/27/2015     ID: Kristine Jacobson is a 81 y.o. female with fuo  Active Problems:   Closed left hip fracture (HCC)   Subjective: No further fever, no cough, some sinus congestion   ROS  Eleven systems are reviewed and negative except per hpi  Medications:  Antibiotics Given (last 72 hours)    Date/Time Action Medication Dose   03/04/15 2115 Given   amoxicillin-clavulanate (AUGMENTIN) 875-125 MG per tablet 1 tablet 1 tablet   03/05/15 1009 Given   amoxicillin-clavulanate (AUGMENTIN) 875-125 MG per tablet 1 tablet 1 tablet     . amLODipine  5 mg Oral Daily  . amoxicillin-clavulanate  1 tablet Oral Q12H  . antiseptic oral rinse  7 mL Mouth Rinse BID  . carvedilol  3.125 mg Oral BID WC  . enoxaparin (LOVENOX) injection  40 mg Subcutaneous Q24H  . ferrous sulfate  325 mg Oral BID WC  . gabapentin  100 mg Oral TID  . losartan  100 mg Oral Daily   And  . hydrochlorothiazide  12.5 mg Oral Daily  . Linaclotide  145 mcg Oral Once per day on Mon Thu  . pantoprazole  40 mg Oral BID  . rOPINIRole  0.5 mg Oral TID  . senna-docusate  1 tablet Oral BID    Objective: Vital signs in last 24 hours: Temp:  [98 F (36.7 C)-98.4 F (36.9 C)] 98 F (36.7 C) (12/20 0829) Pulse Rate:  [65-69] 65 (12/20 0829) Resp:  [18-19] 18 (12/20 0829) BP: (115-134)/(44-53) 115/44 mmHg (12/20 0829) SpO2:  [96 %-100 %] 98 % (12/20 0829) Constitutional: oriented to person, place, and time. appears well-developed and well-nourished. No distress.  HENT: [place]/AT, PERRLA, no scleral icterus Mouth/Throat: Oropharynx is clear/ + mucus drainage in back of throat Cardiovascular: Normal rate, regular rhythm and normal heart sounds. Exam reveals no gallop and no friction rub.  Pulmonary/Chest: mild rhonchi bil Neck supple, no nuchal rigidity Abdominal: Soft. Bowel sounds are normal. exhibits no distension. There is no tenderness.   Lymphadenopathy: no cervical adenopathy. No axillary adenopathy Neurological: alert and oriented to person, place, and time.  Skin: L hip site covered with honeycomb dressing. Has expected post op induration , mild bloody drainage supreiorly Psychiatric: a normal mood and affect. behavior is normal.   Lab Results  Recent Labs  03/03/15 0735 03/04/15 0325  WBC 6.3 5.8  HGB 8.4* 8.0*  HCT 24.4* 23.5*  NA 134* 136  K 3.4* 3.3*  CL 99* 99*  CO2 29 29  BUN 18 20  CREATININE 0.75 0.71    Microbiology: Results for orders placed or performed during the hospital encounter of 02/27/15  Surgical pcr screen     Status: None   Collection Time: 02/27/15 11:47 AM  Result Value Ref Range Status   MRSA, PCR NEGATIVE NEGATIVE Final   Staphylococcus aureus NEGATIVE NEGATIVE Final    Comment:        The Xpert SA Assay (FDA approved for NASAL specimens in patients over 40 years of age), is one component of a comprehensive surveillance program.  Test performance has been validated by [facility] for patients greater than or equal to 42 year old. It is not intended to diagnose infection nor to guide or monitor treatment.   Culture, blood (Routine X 2) w Reflex to ID Panel     Status: None (Preliminary result)   Collection Time: 03/02/15 11:44 AM  Result Value  Ref Range Status   Specimen Description BLOOD LEFT AC  Final   Special Requests   Final    BOTTLES DRAWN AEROBIC AND ANAEROBIC  AER 4CC ANA 1CC   Culture NO GROWTH 3 DAYS  Final   Report Status PENDING  Incomplete  Culture, blood (Routine X 2) w Reflex to ID Panel     Status: None (Preliminary result)   Collection Time: 03/02/15 11:56 AM  Result Value Ref Range Status   Specimen Description BLOOD RIGHT HAND  Final   Special Requests BOTTLES DRAWN AEROBIC AND ANAEROBIC  6CC  Final   Culture NO GROWTH 3 DAYS  Final   Report Status PENDING  Incomplete  Urine culture     Status: None   Collection Time: 03/02/15  5:36 PM   Result Value Ref Range Status   Specimen Description URINE, RANDOM  Final   Special Requests NONE  Final   Culture MULTIPLE SPECIES PRESENT, SUGGEST RECOLLECTION  Final   Report Status 03/04/2015 FINAL  Final    Studies/Results: US Venous Img Lower Bilateral  03/03/2015  CLINICAL DATA:  Recent hip surgery. Left leg pain and edema. Fevers. Bilateral leg swelling. EXAM: BILATERAL LOWER EXTREMITY VENOUS DOPPLER ULTRASOUND TECHNIQUE: Gray-scale sonography with graded compression, as well as color Doppler and duplex ultrasound were performed to evaluate the lower extremity deep venous systems from the level of the common femoral vein and including the common femoral, femoral, profunda femoral, popliteal and calf veins including the posterior tibial, peroneal and gastrocnemius veins when visible. The superficial great saphenous vein was also interrogated. Spectral Doppler was utilized to evaluate flow at rest and with distal augmentation maneuvers in the common femoral, femoral and popliteal veins. COMPARISON:  None. FINDINGS: RIGHT LOWER EXTREMITY Common Femoral Vein: No evidence of thrombus. Normal compressibility, respiratory phasicity and response to augmentation. Saphenofemoral Junction: No evidence of thrombus. Normal compressibility and flow on color Doppler imaging. Profunda Femoral Vein: No evidence of thrombus. Normal compressibility and flow on color Doppler imaging. Femoral Vein: No evidence of thrombus. Normal compressibility, respiratory phasicity and response to augmentation. Popliteal Vein: No evidence of thrombus. Normal compressibility, respiratory phasicity and response to augmentation. Calf Veins: No evidence of thrombus. Normal compressibility and flow on color Doppler imaging. Superficial Great Saphenous Vein: No evidence of thrombus. Normal compressibility and flow on color Doppler imaging. Venous Reflux:  None. Other Findings:  None. LEFT LOWER EXTREMITY Common Femoral Vein: No evidence  of thrombus. Normal compressibility, respiratory phasicity and response to augmentation. Saphenofemoral Junction: No evidence of thrombus. Normal compressibility and flow on color Doppler imaging. Profunda Femoral Vein: No evidence of thrombus. Normal compressibility and flow on color Doppler imaging. Femoral Vein: No evidence of thrombus. Normal compressibility, respiratory phasicity and response to augmentation. Popliteal Vein: No evidence of thrombus. Normal compressibility, respiratory phasicity and response to augmentation. Calf Veins: No evidence of thrombus. Normal compressibility and flow on color Doppler imaging. Superficial Great Saphenous Vein: No evidence of thrombus. Normal compressibility and flow on color Doppler imaging. Venous Reflux:  None. Other Findings:  None. IMPRESSION: No evidence of deep venous thrombosis. Electronically Signed   By: Kerby Moors M.D.   On: 03/03/2015 16:20    Assessment/Plan: Assessment:  CHONTEL ARCHILA is a 81 y.o. female with hx OA, htn admitted 12.14.16 after a fall, leading to acute left intertrochanteric fracture. She on admit had no fever, wbc 11.2. Underwent ORIF 12/14. Remained afebrile until 12/17 when developed fever to 102.5/wbc 6. CX done 12.17 neg and  ua neg, ucx neg. Only abx was periop cefazolin 12/14-15. Doppler neg, CT chest neg for PE or infiltrate. MRSA PCR negative. Ortho does not feel hip incision is infected but some mild bloody drainage at superior point. Does have some nasal congestion, mild cough, mucus in back of throat with post nasal drip and mild HA but no facial tenderness.  Possible sources include URI but would not expect high fevers without sinusitis or incision site infection.  Her WBC is nml, procalcitonin low  Recommendations Continue augmentin for possible sinus infection x 10 day course Close follow up of her incision site.  Thank you very much for the consult.   [place], [place]   03/05/2015, 2:05  PM

## 2015-03-05 NOTE — Progress Notes (Signed)
  Subjective: 6 Days Post-Op Procedure(s) (LRB): INTRAMEDULLARY (IM) NAIL INTERTROCHANTRIC (Left) Patient reports pain as mild, and is slowly improving with physical therapy  Patient is well with mild pain. Continue with physical therapy today.  Plan is to go Rehab after hospital stay. Possible today no nausea and no vomiting Patient has no fever. Normal white count yesterday.  Patient complaining of her restless leg syndrome given her problems on the night Objective: Vital signs in last 24 hours: Temp:  [98.3 F (36.8 C)-98.5 F (36.9 C)] 98.3 F (36.8 C) (12/20 0416) Pulse Rate:  [65-71] 69 (12/20 0416) Resp:  [17-19] 18 (12/20 0416) BP: (121-134)/(48-55) 134/49 mmHg (12/20 0416) SpO2:  [96 %-100 %] 99 % (12/20 0416) well approximated incision with very minimal clear drainage at the superior incision. A new dressing was applied. Signs of infection. Heels are non tender and elevated off the bed using rolled towels Intake/Output from previous day: 12/19 0701 - 12/20 0700 In: 480 [P.O.:480] Out: -  Intake/Output this shift:     Recent Labs  03/03/15 0735 03/04/15 0325  HGB 8.4* 8.0*    Recent Labs  03/03/15 0735 03/04/15 0325  WBC 6.3 5.8  RBC 2.73* 2.62*  HCT 24.4* 23.5*  PLT 136* 151    Recent Labs  03/03/15 0735 03/04/15 0325  NA 134* 136  K 3.4* 3.3*  CL 99* 99*  CO2 29 29  BUN 18 20  CREATININE 0.75 0.71  GLUCOSE 123* 122*  CALCIUM 7.8* 8.1*   No results for input(s): LABPT, INR in the last 72 hours.  EXAM General - Patient is Alert, Appropriate and Oriented Extremity - Neurologically intact Neurovascular intact Sensation intact distally Intact pulses distally Dorsiflexion/Plantar flexion intact Dressing - scant drainage and the drainage is bloody. the drainage is in the superior aspect of the incision. No signs of infection.   Motor Function - intact, moving foot and toes well on exam.  The patient ambulated 3 feet with physical  therapy.  Past Medical History  Diagnosis Date  . Arthritis   . Restless leg   . Overactive bladder   . Macular degeneration, age related   . HBP (high blood pressure)   . Spinal stenosis   . Osteoporosis     Assessment/Plan: 6 Days Post-Op Procedure(s) (LRB): INTRAMEDULLARY (IM) NAIL INTERTROCHANTRIC (Left) Active Problems:   Closed left hip fracture (HCC)  Estimated body mass index is 23.49 kg/(m^2) as calculated from the following:   Height as of this encounter: 5\' 7"  (1.702 m).   Weight as of this encounter: 68.04 kg (150 lb). Up with therapy Discharge to SNF  Labs: Were reviewed, WBC 6.3 this AM, Hg stable at 8.4 DVT Prophylaxis - Lovenox, Foot Pumps and TED hose Weight-Bearing as tolerated to left leg Patient had a BM. Left hip incision shows no signs of infection with mild scant serosanguineous drainage.   Patient will need follow-up in [facility] orthopedics in 6 weeks with Dr. Haskel Khan will need to be removed from left hip 2 weeks postop and apply benzoin and half-inch Steri-Strips Continue Lovenox 40 mg daily for 14 days after discharge Change dressing as needed  Reche Dixon PA-C [place] 03/05/2015, 7:13 AM

## 2015-03-05 NOTE — Clinical Social Work Note (Signed)
Patient discharging today to go to [facility]. Discharge information sent. Nurse to call report. Patient and family in the room aware of discharge today and in agreement. Shela Leff MSW,LCSW [phone]

## 2015-03-05 NOTE — Clinical Social Work Note (Signed)
Reauth from [facility] received. Will facilitate discharge to [facility] when time. Shela Leff MSW,LCSW [phone]

## 2015-03-05 NOTE — Clinical Social Work Placement (Signed)
   CLINICAL SOCIAL WORK PLACEMENT  NOTE  Date:  03/05/2015  Patient Details  Name: Kristine Jacobson MRN: [id number] Date of Birth: [birth year]  Clinical Social Work is seeking post-discharge placement for this patient at the [place] level of care (*CSW will initial, date and re-position this form in  chart as items are completed):  Yes   Patient/family provided with [place] Work Department's list of facilities offering this level of care within the geographic area requested by the patient (or if unable, by the patient's family).  Yes   Patient/family informed of their freedom to choose among providers that offer the needed level of care, that participate in Medicare, Medicaid or managed care program needed by the patient, have an available bed and are willing to accept the patient.  Yes   Patient/family informed of [facility]'s ownership interest in [facility] and [facility], as well as of the fact that they are under no obligation to receive care at these facilities.  PASRR submitted to EDS on 02/28/15     PASRR number received on 02/28/15     Existing PASRR number confirmed on       FL2 transmitted to all facilities in geographic area requested by pt/family on 02/28/15     FL2 transmitted to all facilities within larger geographic area on       Patient informed that his/her managed care company has contracts with or will negotiate with certain facilities, including the following:        Yes   Patient/family informed of bed offers received.  Patient chooses bed at  [facility])     Physician recommends and patient chooses bed at      Patient to be transferred to  [facility]) on 03/02/15.  Patient to be transferred to facility by  (EMS)     Patient family notified on 03/05/15 of transfer.  Name of family member notified:   (Granddaughter Christy)     PHYSICIAN       Additional Comment:     _______________________________________________ Shela Leff, LCSW 03/05/2015, 1:52 PM

## 2015-03-05 NOTE — H&P (Signed)
Report called to [facility].  

## 2015-03-05 NOTE — Progress Notes (Signed)
Physical Therapy Treatment Patient Details Name: Kristine Jacobson MRN: [id number] DOB: 11/20/1923 Today's Date: 03/05/2015    History of Present Illness This patient is an 81 year old female who came to [facility] after a fall an suffering a left femur fracture. She recieved an ORIF repair.    PT Comments    Pt continues with a lot of pain in LLE (8/10) with movement; comfortable at rest. Pt re educated about the importance of performing exercises 3-5 times a day for movement to prevent soreness/stiffness from lack of movement. Pt has stiff/guarded movement initially on left, but with assist and encouragement, pt able to demonstrate improved range and decreased discomfort. Pt remains in bed in anticipation of bathing/preparing for discharge. Pt has discharge orders to skilled nursing facility today.   Follow Up Recommendations  SNF     Equipment Recommendations       Recommendations for Other Services       Precautions / Restrictions Precautions Precautions: Fall Restrictions Weight Bearing Restrictions: Yes LLE Weight Bearing: Weight bearing as tolerated    Mobility  Bed Mobility               General bed mobility comments: Remained in bed; anticipating bath to prepare for discharge  Transfers                    Ambulation/Gait                 Stairs            Wheelchair Mobility    Modified Rankin (Stroke Patients Only)       Balance                                    Cognition Arousal/Alertness: Awake/alert Behavior During Therapy: WFL for tasks assessed/performed Overall Cognitive Status: Within Functional Limits for tasks assessed                      Exercises General Exercises - Lower Extremity Ankle Circles/Pumps: AROM;Both;20 reps Quad Sets: Strengthening;Both;20 reps Gluteal Sets: Strengthening;Both;20 reps;Supine Short Arc Quad: AROM;Both;20 reps;Supine Heel Slides: AAROM;Left;20 reps;Supine (R  AROM) Hip ABduction/ADduction: AAROM;Left;20 reps;Supine (AROM on R with assist to hold heel only) Straight Leg Raises: AAROM;Both;20 reps;Supine    General Comments        Pertinent Vitals/Pain Pain Assessment: 0-10 Pain Score: 8  (2 at rest; 8 with movement) Pain Location: LLE Pain Intervention(s): Premedicated before session;Monitored during session;Limited activity within patient's tolerance    Home Living                      Prior Function            PT Goals (current goals can now be found in the care plan section) Progress towards PT goals: Progressing toward goals (slowly)    Frequency  BID    PT Plan Current plan remains appropriate    Co-evaluation             End of Session Equipment Utilized During Treatment: Oxygen Activity Tolerance: Patient tolerated treatment well;Patient limited by pain (slow/guarded movement on L) Patient left: in bed;with call bell/phone within reach;with bed alarm set;with SCD's reapplied     Time: 1034-     Charges:  $Therapeutic Exercise: 23-37 mins  G Codes:      Charlaine Dalton 03/05/2015, 10:48 AM

## 2015-03-07 DIAGNOSIS — D649 Anemia, unspecified: Secondary | ICD-10-CM | POA: Diagnosis not present

## 2015-03-07 DIAGNOSIS — E871 Hypo-osmolality and hyponatremia: Secondary | ICD-10-CM | POA: Diagnosis present

## 2015-03-07 LAB — COMPREHENSIVE METABOLIC PANEL
ALBUMIN: 3 g/dL — AB (ref 3.5–5.0)
ALK PHOS: 78 U/L (ref 38–126)
ALT: 32 U/L (ref 14–54)
AST: 46 U/L — ABNORMAL HIGH (ref 15–41)
Anion gap: 8 (ref 5–15)
BILIRUBIN TOTAL: 1 mg/dL (ref 0.3–1.2)
BUN: 26 mg/dL — AB (ref 6–20)
CALCIUM: 8.2 mg/dL — AB (ref 8.9–10.3)
CO2: 28 mmol/L (ref 22–32)
CREATININE: 0.96 mg/dL (ref 0.44–1.00)
Chloride: 91 mmol/L — ABNORMAL LOW (ref 101–111)
GFR calc Af Amer: 59 mL/min — ABNORMAL LOW (ref >60)
GFR calc non Af Amer: 51 mL/min — ABNORMAL LOW (ref >60)
GLUCOSE: 108 mg/dL — AB (ref 65–99)
Potassium: 3.7 mmol/L (ref 3.5–5.1)
SODIUM: 127 mmol/L — AB (ref 135–145)
TOTAL PROTEIN: 6.8 g/dL (ref 6.5–8.1)

## 2015-03-07 LAB — CBC WITH DIFFERENTIAL/PLATELET
Basophils Absolute: 0.1 10*3/uL (ref 0–0.1)
Basophils Relative: 1 %
EOS PCT: 3 %
Eosinophils Absolute: 0.3 10*3/uL (ref 0–0.7)
HEMATOCRIT: 26.9 % — AB (ref 35.0–47.0)
Hemoglobin: 8.9 g/dL — ABNORMAL LOW (ref 12.0–16.0)
LYMPHS ABS: 1.4 10*3/uL (ref 1.0–3.6)
LYMPHS PCT: 15 %
MCH: 30.1 pg (ref 26.0–34.0)
MCHC: 33.2 g/dL (ref 32.0–36.0)
MCV: 90.8 fL (ref 80.0–100.0)
Monocytes Absolute: 0.8 10*3/uL (ref 0.2–0.9)
Monocytes Relative: 9 %
Neutro Abs: 6.5 10*3/uL (ref 1.4–6.5)
Neutrophils Relative %: 72 %
PLATELETS: 309 10*3/uL (ref 150–440)
RBC: 2.96 MIL/uL — AB (ref 3.80–5.20)
RDW: 13.4 % (ref 11.5–14.5)
WBC: 9.1 10*3/uL (ref 3.6–11.0)

## 2015-03-08 LAB — CULTURE, BLOOD (ROUTINE X 2)
CULTURE: NO GROWTH
Culture: NO GROWTH

## 2015-03-14 DIAGNOSIS — D649 Anemia, unspecified: Secondary | ICD-10-CM | POA: Diagnosis not present

## 2015-03-14 LAB — CBC WITH DIFFERENTIAL/PLATELET
BASOS PCT: 1 %
Basophils Absolute: 0.1 10*3/uL (ref 0–0.1)
EOS ABS: 0.2 10*3/uL (ref 0–0.7)
Eosinophils Relative: 3 %
HCT: 28.5 % — ABNORMAL LOW (ref 35.0–47.0)
HEMOGLOBIN: 9.5 g/dL — AB (ref 12.0–16.0)
Lymphocytes Relative: 17 %
Lymphs Abs: 1.2 10*3/uL (ref 1.0–3.6)
MCH: 29.8 pg (ref 26.0–34.0)
MCHC: 33.4 g/dL (ref 32.0–36.0)
MCV: 89.4 fL (ref 80.0–100.0)
MONOS PCT: 8 %
Monocytes Absolute: 0.5 10*3/uL (ref 0.2–0.9)
NEUTROS PCT: 71 %
Neutro Abs: 5 10*3/uL (ref 1.4–6.5)
PLATELETS: 474 10*3/uL — AB (ref 150–440)
RBC: 3.18 MIL/uL — ABNORMAL LOW (ref 3.80–5.20)
RDW: 14.5 % (ref 11.5–14.5)
WBC: 7 10*3/uL (ref 3.6–11.0)

## 2015-03-14 LAB — COMPREHENSIVE METABOLIC PANEL
ALBUMIN: 3.2 g/dL — AB (ref 3.5–5.0)
ALK PHOS: 97 U/L (ref 38–126)
ALT: 29 U/L (ref 14–54)
ANION GAP: 11 (ref 5–15)
AST: 22 U/L (ref 15–41)
BUN: 17 mg/dL (ref 6–20)
CALCIUM: 8.9 mg/dL (ref 8.9–10.3)
CHLORIDE: 98 mmol/L — AB (ref 101–111)
CO2: 25 mmol/L (ref 22–32)
Creatinine, Ser: 0.81 mg/dL (ref 0.44–1.00)
GFR calc Af Amer: >60 mL/min (ref >60)
GFR calc non Af Amer: >60 mL/min (ref >60)
GLUCOSE: 102 mg/dL — AB (ref 65–99)
POTASSIUM: 3.8 mmol/L (ref 3.5–5.1)
SODIUM: 134 mmol/L — AB (ref 135–145)
Total Bilirubin: 0.8 mg/dL (ref 0.3–1.2)
Total Protein: 7.1 g/dL (ref 6.5–8.1)

## 2015-03-18 ENCOUNTER — Encounter
Admission: RE | Admit: 2015-03-18 | Discharge: 2015-03-18 | Disposition: A | Discharge status: Home / Self Care | Payer: Medicare Other | Source: Ambulatory Visit | Attending: Internal Medicine | Admitting: Internal Medicine

## 2015-04-14 DIAGNOSIS — S72002A Fracture of unspecified part of neck of left femur, initial encounter for closed fracture: Secondary | ICD-10-CM | POA: Insufficient documentation

## 2015-04-17 DIAGNOSIS — M2042 Other hammer toe(s) (acquired), left foot: Secondary | ICD-10-CM | POA: Diagnosis not present

## 2015-04-17 DIAGNOSIS — M79675 Pain in left toe(s): Secondary | ICD-10-CM | POA: Diagnosis not present

## 2015-04-17 DIAGNOSIS — M2041 Other hammer toe(s) (acquired), right foot: Secondary | ICD-10-CM | POA: Diagnosis not present

## 2015-04-17 DIAGNOSIS — M79674 Pain in right toe(s): Secondary | ICD-10-CM | POA: Diagnosis not present

## 2015-04-17 DIAGNOSIS — Q6689 Other  specified congenital deformities of feet: Secondary | ICD-10-CM | POA: Diagnosis not present

## 2015-04-17 DIAGNOSIS — B351 Tinea unguium: Secondary | ICD-10-CM | POA: Diagnosis not present

## 2015-04-23 ENCOUNTER — Encounter: Admission: RE | Payer: Self-pay | Source: Ambulatory Visit

## 2015-04-23 ENCOUNTER — Ambulatory Visit: Admission: RE | Admit: 2015-04-23 | Payer: Medicare Other | Source: Ambulatory Visit | Admitting: Ophthalmology

## 2015-04-23 SURGERY — BLEPHAROPLASTY
Anesthesia: IV Sedation (MBSC Only) | Laterality: Bilateral

## 2015-05-15 DIAGNOSIS — S72002D Fracture of unspecified part of neck of left femur, subsequent encounter for closed fracture with routine healing: Secondary | ICD-10-CM | POA: Diagnosis not present

## 2015-05-15 DIAGNOSIS — M6281 Muscle weakness (generalized): Secondary | ICD-10-CM | POA: Diagnosis not present

## 2015-05-15 DIAGNOSIS — M4806 Spinal stenosis, lumbar region: Secondary | ICD-10-CM | POA: Diagnosis not present

## 2015-05-15 DIAGNOSIS — I10 Essential (primary) hypertension: Secondary | ICD-10-CM | POA: Diagnosis not present

## 2015-05-23 DIAGNOSIS — S72142D Displaced intertrochanteric fracture of left femur, subsequent encounter for closed fracture with routine healing: Secondary | ICD-10-CM | POA: Diagnosis not present

## 2015-05-26 DIAGNOSIS — S72142D Displaced intertrochanteric fracture of left femur, subsequent encounter for closed fracture with routine healing: Secondary | ICD-10-CM | POA: Insufficient documentation

## 2015-06-05 DIAGNOSIS — N3946 Mixed incontinence: Secondary | ICD-10-CM | POA: Diagnosis not present

## 2015-06-05 DIAGNOSIS — N390 Urinary tract infection, site not specified: Secondary | ICD-10-CM | POA: Diagnosis not present

## 2015-06-11 DIAGNOSIS — M25552 Pain in left hip: Secondary | ICD-10-CM | POA: Diagnosis not present

## 2015-06-12 DIAGNOSIS — R350 Frequency of micturition: Secondary | ICD-10-CM | POA: Diagnosis not present

## 2015-06-13 DIAGNOSIS — M25552 Pain in left hip: Secondary | ICD-10-CM | POA: Diagnosis not present

## 2015-06-18 DIAGNOSIS — M25552 Pain in left hip: Secondary | ICD-10-CM | POA: Diagnosis not present

## 2015-06-18 DIAGNOSIS — M4806 Spinal stenosis, lumbar region: Secondary | ICD-10-CM | POA: Diagnosis not present

## 2015-06-18 DIAGNOSIS — I10 Essential (primary) hypertension: Secondary | ICD-10-CM | POA: Diagnosis not present

## 2015-06-19 DIAGNOSIS — R350 Frequency of micturition: Secondary | ICD-10-CM | POA: Diagnosis not present

## 2015-06-20 DIAGNOSIS — M25552 Pain in left hip: Secondary | ICD-10-CM | POA: Diagnosis not present

## 2015-06-20 DIAGNOSIS — N3946 Mixed incontinence: Secondary | ICD-10-CM | POA: Diagnosis not present

## 2015-06-20 DIAGNOSIS — I10 Essential (primary) hypertension: Secondary | ICD-10-CM | POA: Diagnosis not present

## 2015-06-20 DIAGNOSIS — M4806 Spinal stenosis, lumbar region: Secondary | ICD-10-CM | POA: Diagnosis not present

## 2015-06-20 DIAGNOSIS — G2581 Restless legs syndrome: Secondary | ICD-10-CM | POA: Diagnosis not present

## 2015-06-26 DIAGNOSIS — R350 Frequency of micturition: Secondary | ICD-10-CM | POA: Diagnosis not present

## 2015-06-27 DIAGNOSIS — M25552 Pain in left hip: Secondary | ICD-10-CM | POA: Diagnosis not present

## 2015-06-29 ENCOUNTER — Encounter: Payer: Self-pay | Admitting: Emergency Medicine

## 2015-06-29 ENCOUNTER — Ambulatory Visit
Admission: EM | Admit: 2015-06-29 | Discharge: 2015-06-29 | Disposition: A | Discharge status: Other Acute Inpatient Hospital | Payer: Medicare Other | Attending: Family Medicine | Admitting: Family Medicine

## 2015-06-29 DIAGNOSIS — R030 Elevated blood-pressure reading, without diagnosis of hypertension: Secondary | ICD-10-CM | POA: Diagnosis not present

## 2015-06-29 DIAGNOSIS — R51 Headache: Secondary | ICD-10-CM | POA: Diagnosis not present

## 2015-06-29 DIAGNOSIS — M7989 Other specified soft tissue disorders: Secondary | ICD-10-CM | POA: Diagnosis not present

## 2015-06-29 DIAGNOSIS — I10 Essential (primary) hypertension: Secondary | ICD-10-CM | POA: Diagnosis not present

## 2015-06-29 DIAGNOSIS — IMO0001 Reserved for inherently not codable concepts without codable children: Secondary | ICD-10-CM

## 2015-06-29 DIAGNOSIS — Z7982 Long term (current) use of aspirin: Secondary | ICD-10-CM | POA: Diagnosis not present

## 2015-06-29 DIAGNOSIS — R9431 Abnormal electrocardiogram [ECG] [EKG]: Secondary | ICD-10-CM | POA: Diagnosis not present

## 2015-06-29 DIAGNOSIS — R42 Dizziness and giddiness: Secondary | ICD-10-CM | POA: Diagnosis not present

## 2015-06-29 DIAGNOSIS — Z791 Long term (current) use of non-steroidal anti-inflammatories (NSAID): Secondary | ICD-10-CM | POA: Diagnosis not present

## 2015-06-29 LAB — URINALYSIS COMPLETE WITH MICROSCOPIC (ARMC ONLY)
BILIRUBIN URINE: NEGATIVE
GLUCOSE, UA: NEGATIVE mg/dL
Ketones, ur: NEGATIVE mg/dL
LEUKOCYTES UA: NEGATIVE
Nitrite: NEGATIVE
Protein, ur: NEGATIVE mg/dL
Specific Gravity, Urine: 1.01 (ref 1.005–1.030)
pH: 5 (ref 5.0–8.0)

## 2015-06-29 NOTE — ED Notes (Signed)
Patients daughter states patient has been having elevated BP since yesterday and also has urinary frequency for the past week

## 2015-06-29 NOTE — ED Provider Notes (Signed)
CSN: [id number]     Arrival date & time 06/29/15  1144 History   First MD Initiated Contact with Patient 06/29/15 1237     Chief Complaint  Patient presents with  . Hypertension   (Consider location/radiation/quality/duration/timing/severity/associated sxs/prior Treatment) HPI: Patient presents today with her daughter. Patient states that she has noticed elevated blood pressure since yesterday. Her blood pressure has been as high as 190s over 90s. She does have a history of hypertension and has been taking her medication as she normally does. She often does get UTIs as well. She recently was treated for UTI. She denies any significant urinary frequency or dysuria at this time. Her daughter states that oftentimes when she does have a UTI her blood pressure does go up. Patient states that she has had some mild headaches and has been feeling weak. She denies any fever, nausea, vomiting, chest pain, shortness of breath, abdominal pain, diarrhea, hematuria, syncope, one sided weakness, slurred speech, vision problems. Patient denies any history of CVA in the past. She also denies any significant cardiac problems in the past as well besides her hypertension.  Past Medical History  Diagnosis Date  . Arthritis   . Restless leg   . Overactive bladder   . Macular degeneration, age related   . HBP (high blood pressure)   . Spinal stenosis   . Osteoporosis    Past Surgical History  Procedure Laterality Date  . Abdominal hysterectomy    . Tonsillectomy    . Bladder surgery      bladder suspension  . Esophageal dilitation    . Intramedullary (im) nail intertrochanteric Left 02/27/2015    Procedure: INTRAMEDULLARY (IM) NAIL INTERTROCHANTRIC;  Surgeon: Dereck Leep, MD;  Location: [facility] ORS;  Service: Orthopedics;  Laterality: Left;  . Fracture surgery     Family History  Problem Relation Age of Onset  . Diabetes Mother    Social History  Substance Use Topics  . Smoking status: Never Smoker    . Smokeless tobacco: None  . Alcohol Use: No   OB History    No data available     Review of Systems ROS: Negative except mentioned above.  Allergies  Codeine  Home Medications   Prior to Admission medications   Medication Sig Start Date End Date Taking? Authorizing Provider  amLODipine (NORVASC) 2.5 MG tablet Take 2.5 mg by mouth daily.    Yes Historical Provider, MD  aspirin 81 MG tablet Take 81 mg by mouth daily.   Yes Historical Provider, MD  carvedilol (COREG) 3.125 MG tablet Take 3.125 mg by mouth 2 (two) times daily with a meal.   Yes Historical Provider, MD  gabapentin (NEURONTIN) 100 MG capsule Take 1 capsule by mouth 3 (three) times daily. 01/07/15  Yes Historical Provider, MD  LINZESS 145 MCG CAPS capsule Take 1 capsule by mouth 2 (two) times a week. 02/13/15  Yes Historical Provider, MD  losartan-hydrochlorothiazide (HYZAAR) 100-12.5 MG per tablet Take 1 tablet by mouth daily.   Yes Historical Provider, MD  rOPINIRole (REQUIP) 0.5 MG tablet Take 0.5 mg by mouth 3 (three) times daily.   Yes Historical Provider, MD  senna-docusate (SENOKOT-S) 8.6-50 MG tablet Take 1 tablet by mouth 2 (two) times daily. 03/01/15  Yes Epifanio Lesches, MD  enoxaparin (LOVENOX) 40 MG/0.4ML injection Inject 0.4 mLs (40 mg total) into the skin daily. 03/01/15   Epifanio Lesches, MD  guaiFENesin (ROBITUSSIN) 100 MG/5ML SOLN Take 5 mLs (100 mg total) by mouth every 4 (four)  hours as needed for cough or to loosen phlegm. 03/05/15   Aldean Jewett, MD  lactulose (CHRONULAC) 10 GM/15ML solution Take 15 mLs (10 g total) by mouth 2 (two) times daily as needed for mild constipation. 03/01/15   Epifanio Lesches, MD  oxyCODONE (OXY IR/ROXICODONE) 5 MG immediate release tablet Take 1-2 tablets (5-10 mg total) by mouth every 4 (four) hours as needed for breakthrough pain ((for MODERATE breakthrough pain)). 03/01/15   Epifanio Lesches, MD  traMADol (ULTRAM) 50 MG tablet Take 1-2 tablets (50-100  mg total) by mouth every 4 (four) hours as needed for moderate pain. 03/01/15   Epifanio Lesches, MD   Meds Ordered and Administered this Visit  Medications - No data to display  BP 174/63 mmHg  Pulse 60  Temp(Src) 97.9 F (36.6 C) (Oral)  Resp 18  Ht 5\' 7"  (1.702 m)  Wt 150 lb (68.04 kg)  BMI 23.49 kg/m2  SpO2 97% No data found.   Physical Exam   GENERAL: NAD HEENT: no pharyngeal erythema, no exudate, no erythema of TMs, no cervical LAD RESP: CTA B CARD: RRR ABD: +BS, NT NEURO: CN II-XII grossly intact, no slurred speech, AAO x 3  ED Course  Procedures (including critical care time)  Labs Review Labs Reviewed  URINALYSIS COMPLETEWITH MICROSCOPIC ([place]) - Abnormal; Notable for the following:    Hgb urine dipstick 1+ (*)    Bacteria, UA FEW (*)    Squamous Epithelial / LPF 6-30 (*)    All other components within normal limits  URINE CULTURE    Imaging Review No results found.    MDM   1. Elevated blood pressure   Given patient's symptoms and urine not totally indicative of a UTI I would recommend that patient go to the emergency department for further workup and treatment and evaluation. Will send urine for culture. Daughter and patient are okay with this plan. Patient is stable at this point to go to the ER with her daughter transporting her. They will go there now.    Paulina Fusi, MD 06/29/15 [phone]

## 2015-07-01 DIAGNOSIS — Z7982 Long term (current) use of aspirin: Secondary | ICD-10-CM | POA: Diagnosis not present

## 2015-07-01 DIAGNOSIS — I10 Essential (primary) hypertension: Secondary | ICD-10-CM | POA: Diagnosis not present

## 2015-07-01 DIAGNOSIS — Z792 Long term (current) use of antibiotics: Secondary | ICD-10-CM | POA: Diagnosis not present

## 2015-07-01 DIAGNOSIS — M79662 Pain in left lower leg: Secondary | ICD-10-CM | POA: Diagnosis not present

## 2015-07-01 DIAGNOSIS — Z79899 Other long term (current) drug therapy: Secondary | ICD-10-CM | POA: Diagnosis not present

## 2015-07-01 DIAGNOSIS — R600 Localized edema: Secondary | ICD-10-CM | POA: Diagnosis not present

## 2015-07-01 DIAGNOSIS — Z885 Allergy status to narcotic agent status: Secondary | ICD-10-CM | POA: Diagnosis not present

## 2015-07-01 DIAGNOSIS — M7989 Other specified soft tissue disorders: Secondary | ICD-10-CM | POA: Diagnosis not present

## 2015-07-01 DIAGNOSIS — Z791 Long term (current) use of non-steroidal anti-inflammatories (NSAID): Secondary | ICD-10-CM | POA: Diagnosis not present

## 2015-07-01 DIAGNOSIS — M810 Age-related osteoporosis without current pathological fracture: Secondary | ICD-10-CM | POA: Diagnosis not present

## 2015-07-01 DIAGNOSIS — R42 Dizziness and giddiness: Secondary | ICD-10-CM | POA: Diagnosis not present

## 2015-07-01 LAB — URINE CULTURE: Culture: 3000 — AB

## 2015-07-02 DIAGNOSIS — M25552 Pain in left hip: Secondary | ICD-10-CM | POA: Diagnosis not present

## 2015-07-03 DIAGNOSIS — R350 Frequency of micturition: Secondary | ICD-10-CM | POA: Diagnosis not present

## 2015-07-04 DIAGNOSIS — M25552 Pain in left hip: Secondary | ICD-10-CM | POA: Diagnosis not present

## 2015-07-08 DIAGNOSIS — M79674 Pain in right toe(s): Secondary | ICD-10-CM | POA: Diagnosis not present

## 2015-07-08 DIAGNOSIS — B351 Tinea unguium: Secondary | ICD-10-CM | POA: Diagnosis not present

## 2015-07-08 DIAGNOSIS — M2041 Other hammer toe(s) (acquired), right foot: Secondary | ICD-10-CM | POA: Diagnosis not present

## 2015-07-08 DIAGNOSIS — M2042 Other hammer toe(s) (acquired), left foot: Secondary | ICD-10-CM | POA: Diagnosis not present

## 2015-07-08 DIAGNOSIS — M79675 Pain in left toe(s): Secondary | ICD-10-CM | POA: Diagnosis not present

## 2015-07-09 DIAGNOSIS — G2581 Restless legs syndrome: Secondary | ICD-10-CM | POA: Diagnosis not present

## 2015-07-09 DIAGNOSIS — I10 Essential (primary) hypertension: Secondary | ICD-10-CM | POA: Diagnosis not present

## 2015-07-09 DIAGNOSIS — K5900 Constipation, unspecified: Secondary | ICD-10-CM | POA: Diagnosis not present

## 2015-07-10 DIAGNOSIS — R350 Frequency of micturition: Secondary | ICD-10-CM | POA: Diagnosis not present

## 2015-07-17 DIAGNOSIS — R3129 Other microscopic hematuria: Secondary | ICD-10-CM | POA: Diagnosis not present

## 2015-07-17 DIAGNOSIS — R350 Frequency of micturition: Secondary | ICD-10-CM | POA: Diagnosis not present

## 2015-07-17 DIAGNOSIS — N3020 Other chronic cystitis without hematuria: Secondary | ICD-10-CM | POA: Diagnosis not present

## 2015-07-17 DIAGNOSIS — N3941 Urge incontinence: Secondary | ICD-10-CM | POA: Diagnosis not present

## 2015-07-22 ENCOUNTER — Other Ambulatory Visit: Payer: Self-pay | Admitting: Internal Medicine

## 2015-07-22 DIAGNOSIS — R109 Unspecified abdominal pain: Secondary | ICD-10-CM

## 2015-07-24 DIAGNOSIS — R3129 Other microscopic hematuria: Secondary | ICD-10-CM | POA: Diagnosis not present

## 2015-07-24 DIAGNOSIS — R350 Frequency of micturition: Secondary | ICD-10-CM | POA: Diagnosis not present

## 2015-07-31 DIAGNOSIS — R350 Frequency of micturition: Secondary | ICD-10-CM | POA: Diagnosis not present

## 2015-08-07 DIAGNOSIS — R350 Frequency of micturition: Secondary | ICD-10-CM | POA: Diagnosis not present

## 2015-08-14 DIAGNOSIS — R350 Frequency of micturition: Secondary | ICD-10-CM | POA: Diagnosis not present

## 2015-08-21 DIAGNOSIS — N3941 Urge incontinence: Secondary | ICD-10-CM | POA: Diagnosis not present

## 2015-08-28 DIAGNOSIS — N3941 Urge incontinence: Secondary | ICD-10-CM | POA: Diagnosis not present

## 2015-08-28 DIAGNOSIS — R350 Frequency of micturition: Secondary | ICD-10-CM | POA: Diagnosis not present

## 2015-08-28 DIAGNOSIS — N3020 Other chronic cystitis without hematuria: Secondary | ICD-10-CM | POA: Diagnosis not present

## 2015-09-03 DIAGNOSIS — S72002D Fracture of unspecified part of neck of left femur, subsequent encounter for closed fracture with routine healing: Secondary | ICD-10-CM | POA: Diagnosis not present

## 2015-09-03 DIAGNOSIS — S72142D Displaced intertrochanteric fracture of left femur, subsequent encounter for closed fracture with routine healing: Secondary | ICD-10-CM | POA: Diagnosis not present

## 2015-09-04 DIAGNOSIS — M4806 Spinal stenosis, lumbar region: Secondary | ICD-10-CM | POA: Diagnosis not present

## 2015-09-04 DIAGNOSIS — I10 Essential (primary) hypertension: Secondary | ICD-10-CM | POA: Diagnosis not present

## 2015-09-15 ENCOUNTER — Encounter (HOSPITAL_COMMUNITY): Payer: Self-pay | Admitting: Emergency Medicine

## 2015-09-15 ENCOUNTER — Emergency Department (HOSPITAL_COMMUNITY)
Admission: EM | Admit: 2015-09-15 | Discharge: 2015-09-15 | Disposition: A | Discharge status: Home / Self Care | Payer: Medicare Other | Attending: Emergency Medicine | Admitting: Emergency Medicine

## 2015-09-15 ENCOUNTER — Emergency Department (HOSPITAL_COMMUNITY): Payer: Medicare Other

## 2015-09-15 DIAGNOSIS — M545 Low back pain: Secondary | ICD-10-CM | POA: Diagnosis not present

## 2015-09-15 DIAGNOSIS — I10 Essential (primary) hypertension: Secondary | ICD-10-CM | POA: Insufficient documentation

## 2015-09-15 DIAGNOSIS — Z79899 Other long term (current) drug therapy: Secondary | ICD-10-CM | POA: Diagnosis not present

## 2015-09-15 DIAGNOSIS — M79605 Pain in left leg: Secondary | ICD-10-CM | POA: Insufficient documentation

## 2015-09-15 DIAGNOSIS — M79604 Pain in right leg: Secondary | ICD-10-CM | POA: Diagnosis not present

## 2015-09-15 DIAGNOSIS — Z7982 Long term (current) use of aspirin: Secondary | ICD-10-CM | POA: Diagnosis not present

## 2015-09-15 DIAGNOSIS — M549 Dorsalgia, unspecified: Secondary | ICD-10-CM | POA: Diagnosis present

## 2015-09-15 DIAGNOSIS — G8929 Other chronic pain: Secondary | ICD-10-CM | POA: Diagnosis not present

## 2015-09-15 LAB — I-STAT CHEM 8, ED
BUN: 22 mg/dL — AB (ref 6–20)
CHLORIDE: 104 mmol/L (ref 101–111)
CREATININE: 0.8 mg/dL (ref 0.44–1.00)
Calcium, Ion: 1.25 mmol/L — ABNORMAL HIGH (ref 1.12–1.23)
Glucose, Bld: 103 mg/dL — ABNORMAL HIGH (ref 65–99)
HEMATOCRIT: 42 % (ref 36.0–46.0)
Hemoglobin: 14.3 g/dL (ref 12.0–15.0)
Potassium: 4.2 mmol/L (ref 3.5–5.1)
Sodium: 143 mmol/L (ref 135–145)
TCO2: 30 mmol/L (ref 0–100)

## 2015-09-15 LAB — CBC
HEMATOCRIT: 41.6 % (ref 36.0–46.0)
HEMOGLOBIN: 13.7 g/dL (ref 12.0–15.0)
MCH: 30.6 pg (ref 26.0–34.0)
MCHC: 32.9 g/dL (ref 30.0–36.0)
MCV: 93.1 fL (ref 78.0–100.0)
Platelets: 175 10*3/uL (ref 150–400)
RBC: 4.47 MIL/uL (ref 3.87–5.11)
RDW: 13.3 % (ref 11.5–15.5)
WBC: 5.6 10*3/uL (ref 4.0–10.5)

## 2015-09-15 MED ORDER — TRAMADOL HCL 50 MG PO TABS
50.0000 mg | ORAL_TABLET | Freq: Once | ORAL | Status: DC
Start: 2015-09-15 — End: 2015-09-15

## 2015-09-15 MED ORDER — GADOBENATE DIMEGLUMINE 529 MG/ML IV SOLN
15.0000 mL | Freq: Once | INTRAVENOUS | Status: AC | PRN
  Administered 2015-09-15: 15 mL via INTRAVENOUS

## 2015-09-15 MED ORDER — TRAMADOL HCL 50 MG PO TABS: 50.0000 mg | ORAL_TABLET | Freq: Four times a day (QID) | ORAL | Status: AC | PRN

## 2015-09-15 MED ORDER — TRAMADOL HCL 50 MG PO TABS
50.0000 mg | ORAL_TABLET | Freq: Once | ORAL | Status: AC
  Administered 2015-09-15: 50 mg via ORAL
  Filled 2015-09-15: qty 1

## 2015-09-15 NOTE — ED Notes (Signed)
Patient transported to MRI 

## 2015-09-15 NOTE — Discharge Instructions (Signed)
Ms. Lashawndra Jankiewicz [place],  Nice meeting you! Please follow-up with your primary care provider. Return to the emergency department if you develop increasing pain, inability to walk, chest pain, shortness of breath, discolorations/swelling in your legs. Feel better soon!  S. Wendie Simmer, PA-C

## 2015-09-15 NOTE — ED Provider Notes (Signed)
Complains of bilateral leg pain right greater than left for several weeks. Becoming worse 1 week ago after she received a spinal injection. Pain originates at lower back. Radiates to both legs. She has been able to walk without difficulty. She's taken Tylenol with partial relief however  pain is not well controlled with Tylenolno fever. No incontinence. She's been able to walk with her cane without difficulty. Pain is worse with walking or movement improved with remaining still   Orlie Dakin, MD 09/15/15 1547

## 2015-09-15 NOTE — ED Provider Notes (Signed)
Pt care assumed from Ubly PA at 1530.    Hx of lumbar radiculopathy with recent epidural injection.  No concern from primary team for DVT, ischemia, cellulitis.  Obtaining MRI to rule out epidural abscess due to recent procedure.  Able to ambulate.  Plan to discharge if MRI negative.   MRI negative for abscess but stenosis present.  Pt reports sx relief in the ED.  Discussed in depth precautions with tramadol including using when going to bed and supervision.  Educated on immediate return precautions and to f/u with PCP as soon as possible for further control of pain and possible specialist referral.   Geronimo Boot, MD 09/15/15 [phone]

## 2015-09-15 NOTE — ED Provider Notes (Signed)
CSN: [id number]     Arrival date & time 09/15/15  1153 History   First MD Initiated Contact with Patient 09/15/15 1303     Chief Complaint  Patient presents with  . Leg Pain   HPI  Kristine Jacobson is a 82 y.o. female PMH significant for restless leg syndrome, spinal stenosis, arthritis presenting with a 1 week history of bilateral leg pain (worse on right). She describes her pain as burning, constant, worse with movement, minimally alleviated with Tylenol. She states it starts in her ankles and intermittently radiates up to her back. She states two weeks ago, she had an epidural injection for pain management of her chronic back pain. No fevers, chills, nausea/vomiting, chest pain, abdominal pain, inability to ambulate, loss of bladder/bowel control. No recent surgeries, history of blood clot, exogenous estrogen, periods of immobility or long travel.   Past Medical History  Diagnosis Date  . Arthritis   . Restless leg   . Overactive bladder   . Macular degeneration, age related   . HBP (high blood pressure)   . Spinal stenosis   . Osteoporosis    Past Surgical History  Procedure Laterality Date  . Abdominal hysterectomy    . Tonsillectomy    . Bladder surgery      bladder suspension  . Esophageal dilitation    . Intramedullary (im) nail intertrochanteric Left 02/27/2015    Procedure: INTRAMEDULLARY (IM) NAIL INTERTROCHANTRIC;  Surgeon: Dereck Leep, MD;  Location: [facility] ORS;  Service: Orthopedics;  Laterality: Left;  . Fracture surgery     Family History  Problem Relation Age of Onset  . Diabetes Mother    Social History  Substance Use Topics  . Smoking status: Never Smoker   . Smokeless tobacco: None  . Alcohol Use: No   OB History    No data available     Review of Systems  Ten systems are reviewed and are negative for acute change except as noted in the HPI  Allergies  Codeine  Home Medications   Prior to Admission medications   Medication Sig Start Date End  Date Taking? Authorizing Provider  amLODipine (NORVASC) 2.5 MG tablet Take 2.5 mg by mouth daily.     Historical Provider, MD  aspirin 81 MG tablet Take 81 mg by mouth daily.    Historical Provider, MD  carvedilol (COREG) 3.125 MG tablet Take 3.125 mg by mouth 2 (two) times daily with a meal.    Historical Provider, MD  enoxaparin (LOVENOX) 40 MG/0.4ML injection Inject 0.4 mLs (40 mg total) into the skin daily. 03/01/15   Epifanio Lesches, MD  gabapentin (NEURONTIN) 100 MG capsule Take 1 capsule by mouth 3 (three) times daily. 01/07/15   Historical Provider, MD  guaiFENesin (ROBITUSSIN) 100 MG/5ML SOLN Take 5 mLs (100 mg total) by mouth every 4 (four) hours as needed for cough or to loosen phlegm. 03/05/15   Aldean Jewett, MD  lactulose (CHRONULAC) 10 GM/15ML solution Take 15 mLs (10 g total) by mouth 2 (two) times daily as needed for mild constipation. 03/01/15   Epifanio Lesches, MD  LINZESS 145 MCG CAPS capsule Take 1 capsule by mouth 2 (two) times a week. 02/13/15   Historical Provider, MD  losartan-hydrochlorothiazide (HYZAAR) 100-12.5 MG per tablet Take 1 tablet by mouth daily.    Historical Provider, MD  oxyCODONE (OXY IR/ROXICODONE) 5 MG immediate release tablet Take 1-2 tablets (5-10 mg total) by mouth every 4 (four) hours as needed for breakthrough pain ((for  MODERATE breakthrough pain)). 03/01/15   Epifanio Lesches, MD  rOPINIRole (REQUIP) 0.5 MG tablet Take 0.5 mg by mouth 3 (three) times daily.    Historical Provider, MD  senna-docusate (SENOKOT-S) 8.6-50 MG tablet Take 1 tablet by mouth 2 (two) times daily. 03/01/15   Epifanio Lesches, MD  traMADol (ULTRAM) 50 MG tablet Take 1-2 tablets (50-100 mg total) by mouth every 4 (four) hours as needed for moderate pain. 03/01/15   Epifanio Lesches, MD   BP 181/72 mmHg  Pulse 64  Temp(Src) 98.4 F (36.9 C) (Oral)  Resp 16  Ht 5\' 7"  (1.702 m)  Wt 68.04 kg  BMI 23.49 kg/m2  SpO2 100% Physical Exam  Constitutional: She  appears well-developed and well-nourished. No distress.  HENT:  Head: Normocephalic and atraumatic.  Mouth/Throat: Oropharynx is clear and moist. No oropharyngeal exudate.  Eyes: Conjunctivae are normal. Pupils are equal, round, and reactive to light. Right eye exhibits no discharge. Left eye exhibits no discharge. No scleral icterus.  Neck: No tracheal deviation present.  Cardiovascular: Normal rate, regular rhythm, normal heart sounds and intact distal pulses.  Exam reveals no gallop and no friction rub.   No murmur heard. Pulmonary/Chest: Effort normal and breath sounds normal. No respiratory distress. She has no wheezes. She has no rales. She exhibits no tenderness.  Abdominal: Soft. Bowel sounds are normal. She exhibits no distension and no mass. There is no tenderness. There is no rebound and no guarding.  Musculoskeletal: Normal range of motion. She exhibits no edema or tenderness.  Midline lumbar tenderness. No cervical, thoracic midline tenderness. NVI Bl. Strength 5/5 throughout.   Lymphadenopathy:    She has no cervical adenopathy.  Neurological: She is alert. Coordination normal.  Skin: Skin is warm and dry. No rash noted. She is not diaphoretic. No erythema.  Small areas of nonblanching petechiae bilateral lower extremities. Chronic venous stasis changes in BL lower extremities. No erythema/discolorations along spine.   Psychiatric: She has a normal mood and affect. Her behavior is normal.  Nursing note and vitals reviewed.   ED Course  Procedures  Labs Review Labs Reviewed  I-STAT CHEM 8, ED - Abnormal; Notable for the following:    BUN 22 (*)    Glucose, Bld 103 (*)    Calcium, Ion 1.25 (*)    All other components within normal limits  CBC   Imaging Review No results found. I have personally reviewed and evaluated these images and lab results as part of my medical decision-making.  MDM   Final diagnoses:  Back pain   Patient with back pain.  Patient nontoxic  appearing, VSS. No neurological deficits and normal neuro exam.  Patient can walk but states is painful.  Patient ambulatory for me on exam. No loss of bowel or bladder control.  No concern for cauda equina.  No fever, night sweats, weight loss, h/o cancer. Recent epidural injection. Will MRI to r/o epidural abscess.  Chem 8, CBC unremarkable. No leukocytosis.  Daughter, present at bedside, is requesting other pain relief instead of vicodin. Patient's SCr WNL, will give tramadol. Upon reassessment, patient is feeling improved.  Care handoff to Dr. Liston Alba at shift change, pending MRI and pain control.    Prospect Lions, PA-C 09/21/15 [place], MD 09/21/15 [phone]

## 2015-09-15 NOTE — ED Notes (Signed)
C/o R leg pain- "burning and constant throbbing" with numbness x  1 week.  Pt reports epidural injection due to back pain 1 week ago.  No known injury.  Pt ambulatory with cane.

## 2015-09-24 DIAGNOSIS — Z6825 Body mass index (BMI) 25.0-25.9, adult: Secondary | ICD-10-CM | POA: Diagnosis not present

## 2015-09-24 DIAGNOSIS — M4806 Spinal stenosis, lumbar region: Secondary | ICD-10-CM | POA: Diagnosis not present

## 2015-09-24 DIAGNOSIS — I10 Essential (primary) hypertension: Secondary | ICD-10-CM | POA: Diagnosis not present

## 2015-10-02 DIAGNOSIS — R350 Frequency of micturition: Secondary | ICD-10-CM | POA: Diagnosis not present

## 2015-10-04 DIAGNOSIS — Z85828 Personal history of other malignant neoplasm of skin: Secondary | ICD-10-CM | POA: Diagnosis not present

## 2015-10-04 DIAGNOSIS — D485 Neoplasm of uncertain behavior of skin: Secondary | ICD-10-CM | POA: Diagnosis not present

## 2015-10-04 DIAGNOSIS — L821 Other seborrheic keratosis: Secondary | ICD-10-CM | POA: Diagnosis not present

## 2015-10-04 DIAGNOSIS — Z08 Encounter for follow-up examination after completed treatment for malignant neoplasm: Secondary | ICD-10-CM | POA: Diagnosis not present

## 2015-10-08 DIAGNOSIS — N3946 Mixed incontinence: Secondary | ICD-10-CM | POA: Diagnosis not present

## 2015-10-08 DIAGNOSIS — K219 Gastro-esophageal reflux disease without esophagitis: Secondary | ICD-10-CM | POA: Diagnosis not present

## 2015-10-08 DIAGNOSIS — I10 Essential (primary) hypertension: Secondary | ICD-10-CM | POA: Diagnosis not present

## 2015-10-08 DIAGNOSIS — M4806 Spinal stenosis, lumbar region: Secondary | ICD-10-CM | POA: Diagnosis not present

## 2015-10-08 DIAGNOSIS — E782 Mixed hyperlipidemia: Secondary | ICD-10-CM | POA: Diagnosis not present

## 2015-10-09 DIAGNOSIS — Z6825 Body mass index (BMI) 25.0-25.9, adult: Secondary | ICD-10-CM | POA: Diagnosis not present

## 2015-10-09 DIAGNOSIS — I10 Essential (primary) hypertension: Secondary | ICD-10-CM | POA: Diagnosis not present

## 2015-10-09 DIAGNOSIS — M4806 Spinal stenosis, lumbar region: Secondary | ICD-10-CM | POA: Diagnosis not present

## 2015-10-30 DIAGNOSIS — R350 Frequency of micturition: Secondary | ICD-10-CM | POA: Diagnosis not present

## 2015-11-11 DIAGNOSIS — R112 Nausea with vomiting, unspecified: Secondary | ICD-10-CM | POA: Diagnosis not present

## 2015-11-11 DIAGNOSIS — N390 Urinary tract infection, site not specified: Secondary | ICD-10-CM | POA: Diagnosis not present

## 2015-11-11 DIAGNOSIS — K2900 Acute gastritis without bleeding: Secondary | ICD-10-CM | POA: Diagnosis not present

## 2015-11-22 DIAGNOSIS — Z23 Encounter for immunization: Secondary | ICD-10-CM | POA: Diagnosis not present

## 2015-11-27 DIAGNOSIS — R350 Frequency of micturition: Secondary | ICD-10-CM | POA: Diagnosis not present

## 2015-12-20 DIAGNOSIS — H353221 Exudative age-related macular degeneration, left eye, with active choroidal neovascularization: Secondary | ICD-10-CM | POA: Diagnosis not present

## 2015-12-25 DIAGNOSIS — R350 Frequency of micturition: Secondary | ICD-10-CM | POA: Diagnosis not present

## 2016-01-17 DIAGNOSIS — H353221 Exudative age-related macular degeneration, left eye, with active choroidal neovascularization: Secondary | ICD-10-CM | POA: Diagnosis not present

## 2016-01-27 DIAGNOSIS — N3941 Urge incontinence: Secondary | ICD-10-CM | POA: Diagnosis not present

## 2016-02-12 DIAGNOSIS — M48062 Spinal stenosis, lumbar region with neurogenic claudication: Secondary | ICD-10-CM | POA: Diagnosis not present

## 2016-02-12 DIAGNOSIS — M5416 Radiculopathy, lumbar region: Secondary | ICD-10-CM | POA: Diagnosis not present

## 2016-02-12 DIAGNOSIS — I10 Essential (primary) hypertension: Secondary | ICD-10-CM | POA: Diagnosis not present

## 2016-02-19 DIAGNOSIS — H353221 Exudative age-related macular degeneration, left eye, with active choroidal neovascularization: Secondary | ICD-10-CM | POA: Diagnosis not present

## 2016-02-20 DIAGNOSIS — N3020 Other chronic cystitis without hematuria: Secondary | ICD-10-CM | POA: Diagnosis not present

## 2016-02-20 DIAGNOSIS — N3941 Urge incontinence: Secondary | ICD-10-CM | POA: Diagnosis not present

## 2016-02-20 DIAGNOSIS — R350 Frequency of micturition: Secondary | ICD-10-CM | POA: Diagnosis not present

## 2016-02-20 DIAGNOSIS — R339 Retention of urine, unspecified: Secondary | ICD-10-CM | POA: Diagnosis not present

## 2016-02-25 DIAGNOSIS — I10 Essential (primary) hypertension: Secondary | ICD-10-CM | POA: Diagnosis not present

## 2016-02-25 DIAGNOSIS — Z0001 Encounter for general adult medical examination with abnormal findings: Secondary | ICD-10-CM | POA: Diagnosis not present

## 2016-02-25 DIAGNOSIS — M5136 Other intervertebral disc degeneration, lumbar region: Secondary | ICD-10-CM | POA: Diagnosis not present

## 2016-02-25 DIAGNOSIS — E782 Mixed hyperlipidemia: Secondary | ICD-10-CM | POA: Diagnosis not present

## 2016-02-25 DIAGNOSIS — K5900 Constipation, unspecified: Secondary | ICD-10-CM | POA: Diagnosis not present

## 2016-02-25 DIAGNOSIS — M810 Age-related osteoporosis without current pathological fracture: Secondary | ICD-10-CM | POA: Diagnosis not present

## 2016-02-25 DIAGNOSIS — G2581 Restless legs syndrome: Secondary | ICD-10-CM | POA: Diagnosis not present

## 2016-02-28 DIAGNOSIS — K5900 Constipation, unspecified: Secondary | ICD-10-CM | POA: Diagnosis not present

## 2016-02-28 DIAGNOSIS — I10 Essential (primary) hypertension: Secondary | ICD-10-CM | POA: Diagnosis not present

## 2016-02-28 DIAGNOSIS — G2581 Restless legs syndrome: Secondary | ICD-10-CM | POA: Diagnosis not present

## 2016-03-18 DIAGNOSIS — R350 Frequency of micturition: Secondary | ICD-10-CM | POA: Diagnosis not present

## 2016-03-20 DIAGNOSIS — H353221 Exudative age-related macular degeneration, left eye, with active choroidal neovascularization: Secondary | ICD-10-CM | POA: Diagnosis not present

## 2016-03-30 ENCOUNTER — Other Ambulatory Visit: Payer: Self-pay | Admitting: Internal Medicine

## 2016-03-30 DIAGNOSIS — Z1231 Encounter for screening mammogram for malignant neoplasm of breast: Secondary | ICD-10-CM

## 2016-03-31 ENCOUNTER — Ambulatory Visit: Payer: Medicare Other

## 2016-04-15 DIAGNOSIS — R350 Frequency of micturition: Secondary | ICD-10-CM | POA: Diagnosis not present

## 2016-04-20 DIAGNOSIS — H353221 Exudative age-related macular degeneration, left eye, with active choroidal neovascularization: Secondary | ICD-10-CM | POA: Diagnosis not present

## 2016-04-23 DIAGNOSIS — S72002D Fracture of unspecified part of neck of left femur, subsequent encounter for closed fracture with routine healing: Secondary | ICD-10-CM | POA: Diagnosis not present

## 2016-04-23 DIAGNOSIS — S72142D Displaced intertrochanteric fracture of left femur, subsequent encounter for closed fracture with routine healing: Secondary | ICD-10-CM | POA: Diagnosis not present

## 2016-04-24 ENCOUNTER — Ambulatory Visit: Payer: Medicare Other | Attending: Internal Medicine

## 2016-05-13 DIAGNOSIS — R350 Frequency of micturition: Secondary | ICD-10-CM | POA: Diagnosis not present

## 2016-05-29 DIAGNOSIS — H353221 Exudative age-related macular degeneration, left eye, with active choroidal neovascularization: Secondary | ICD-10-CM | POA: Diagnosis not present

## 2016-06-08 ENCOUNTER — Ambulatory Visit: Payer: Medicare Other

## 2016-06-10 DIAGNOSIS — R350 Frequency of micturition: Secondary | ICD-10-CM | POA: Diagnosis not present

## 2016-06-25 DIAGNOSIS — E782 Mixed hyperlipidemia: Secondary | ICD-10-CM | POA: Diagnosis not present

## 2016-06-25 DIAGNOSIS — K5900 Constipation, unspecified: Secondary | ICD-10-CM | POA: Diagnosis not present

## 2016-06-25 DIAGNOSIS — I10 Essential (primary) hypertension: Secondary | ICD-10-CM | POA: Diagnosis not present

## 2016-06-25 DIAGNOSIS — M5136 Other intervertebral disc degeneration, lumbar region: Secondary | ICD-10-CM | POA: Diagnosis not present

## 2016-07-03 ENCOUNTER — Ambulatory Visit
Admission: RE | Admit: 2016-07-03 | Discharge: 2016-07-03 | Disposition: A | Discharge status: Home / Self Care | Payer: Medicare Other | Source: Ambulatory Visit | Attending: Internal Medicine | Admitting: Internal Medicine

## 2016-07-03 DIAGNOSIS — Z1231 Encounter for screening mammogram for malignant neoplasm of breast: Secondary | ICD-10-CM | POA: Diagnosis not present

## 2016-07-08 DIAGNOSIS — R350 Frequency of micturition: Secondary | ICD-10-CM | POA: Diagnosis not present

## 2016-07-13 DIAGNOSIS — H353221 Exudative age-related macular degeneration, left eye, with active choroidal neovascularization: Secondary | ICD-10-CM | POA: Diagnosis not present

## 2016-08-07 DIAGNOSIS — R350 Frequency of micturition: Secondary | ICD-10-CM | POA: Diagnosis not present

## 2016-08-19 DIAGNOSIS — M5416 Radiculopathy, lumbar region: Secondary | ICD-10-CM | POA: Diagnosis not present

## 2016-08-19 DIAGNOSIS — M48062 Spinal stenosis, lumbar region with neurogenic claudication: Secondary | ICD-10-CM | POA: Diagnosis not present

## 2016-08-31 DIAGNOSIS — H353221 Exudative age-related macular degeneration, left eye, with active choroidal neovascularization: Secondary | ICD-10-CM | POA: Diagnosis not present

## 2016-09-02 DIAGNOSIS — R350 Frequency of micturition: Secondary | ICD-10-CM | POA: Diagnosis not present

## 2016-09-03 DIAGNOSIS — H02403 Unspecified ptosis of bilateral eyelids: Secondary | ICD-10-CM | POA: Diagnosis not present

## 2016-09-15 DIAGNOSIS — N390 Urinary tract infection, site not specified: Secondary | ICD-10-CM | POA: Diagnosis not present

## 2016-09-15 DIAGNOSIS — M150 Primary generalized (osteo)arthritis: Secondary | ICD-10-CM | POA: Diagnosis not present

## 2016-09-15 DIAGNOSIS — I10 Essential (primary) hypertension: Secondary | ICD-10-CM | POA: Diagnosis not present

## 2016-09-15 DIAGNOSIS — M545 Low back pain: Secondary | ICD-10-CM | POA: Diagnosis not present

## 2016-09-29 DIAGNOSIS — N3020 Other chronic cystitis without hematuria: Secondary | ICD-10-CM | POA: Diagnosis not present

## 2016-09-29 DIAGNOSIS — N3941 Urge incontinence: Secondary | ICD-10-CM | POA: Diagnosis not present

## 2016-09-29 DIAGNOSIS — R350 Frequency of micturition: Secondary | ICD-10-CM | POA: Diagnosis not present

## 2016-10-02 DIAGNOSIS — Z85828 Personal history of other malignant neoplasm of skin: Secondary | ICD-10-CM | POA: Diagnosis not present

## 2016-10-02 DIAGNOSIS — D3611 Benign neoplasm of peripheral nerves and autonomic nervous system of face, head, and neck: Secondary | ICD-10-CM | POA: Diagnosis not present

## 2016-10-02 DIAGNOSIS — L821 Other seborrheic keratosis: Secondary | ICD-10-CM | POA: Diagnosis not present

## 2016-10-02 DIAGNOSIS — D3610 Benign neoplasm of peripheral nerves and autonomic nervous system, unspecified: Secondary | ICD-10-CM | POA: Diagnosis not present

## 2016-10-14 DIAGNOSIS — N3020 Other chronic cystitis without hematuria: Secondary | ICD-10-CM | POA: Diagnosis not present

## 2016-10-26 DIAGNOSIS — H353221 Exudative age-related macular degeneration, left eye, with active choroidal neovascularization: Secondary | ICD-10-CM | POA: Diagnosis not present

## 2016-10-30 DIAGNOSIS — R350 Frequency of micturition: Secondary | ICD-10-CM | POA: Diagnosis not present

## 2016-11-24 ENCOUNTER — Encounter: Payer: Self-pay | Admitting: *Deleted

## 2016-11-25 NOTE — Discharge Instructions (Signed)
INSTRUCTIONS FOLLOWING OCULOPLASTIC SURGERY °AMY M. FOWLER, MD ° °AFTER YOUR EYE SURGERY, THER ARE MANY THINGS THWIHC YOU, THE PATIENT, CAN DO TO ASSURE THE BEST POSSIBLE RESULT FROM YOUR OPERATION.  THIS SHEET SHOULD BE REFERRED TO WHENEVER QUESTIONS ARISE.  IF THERE ARE ANY QUESTIONS NOT ANSWERED HERE, DO NOT HESITATE TO CALL OUR OFFICE AT [phone] OR [phone].  THERE IS ALWAYS [facility] AVAILABLE TO CALL IF QUESTIONS OR PROBLEMS ARISE. ° °VISION: Your vision may be blurred and out of focus after surgery until you are able to stop using your ointment, swelling resolves and your eye(s) heal. This may take 1 to 2 weeks at the least.  If your vision becomes gradually more dim or dark, this is not normal and you need to call our office immediately. ° °EYE CARE: For the first 48 hours after surgery, use ice packs frequently - “20 minutes on, 20 minutes off” - to help reduce swelling and bruising.  Small bags of frozen peas or corn make good ice packs along with cloths soaked in ice water.  If you are wearing a patch or other type of dressing following surgery, keep this on for the amount of time specified by your doctor.  For the first week following surgery, you will need to treat your stitches with great care.  If is OK to shower, but take care to not allow soapy water to run into your eye(s) to help reduce changes of infection.  You may gently clean the eyelashes and around the eye(s) with cotton balls and sterile water, BUT DO NOT RUB THE STITCHES VIGOROUSLY.  Keeping your stitches moist with ointment will help promote healing with minimal scar formation. ° °ACTIVITY: When you leave the surgery center, you should go home, rest and be inactive.  The eye(s) may feel scratchy and keeping the eyes closed will allow for faster healing.  The first week following surgery, avoid straining (anything making the face turn red) or lifting over 20 pounds.  Additionally, avoid bending which causes your head to go below  your waist.  Using your eyes will NOT harm them, so feel free to read, watch television, use the computer, etc as desired.  Driving depends on each individual, so check with your doctor if you have questions about driving. ° °MEDICATIONS:  You will be given a prescription for an ointment to use 4 times a day on your stitches.  You can use the ointment in your eyes if they feel scratchy or irritated.  If you eyelid(s) don’t close completely when you sleep, put some ointment in your eyes before bedtime. ° °EMERGENCY: If you experience SEVERE EYE PAIN OR HEADACHE UNRELIEVED BY TYLENOL OR PERCOCET, NAUSEA OR VOMITING, WORSENING REDNESS, OR WORSENING VISION (ESPECIALLY VISION THAT WA INITIALLY BETTER) CALL [phone] OR [phone] DURING BUSINESS HOURS OR AFTER HOURS. ° °General Anesthesia, Adult, Care After °These instructions provide you with information about caring for yourself after your procedure. Your health care provider may also give you more specific instructions. Your treatment has been planned according to current medical practices, but problems sometimes occur. Call your health care provider if you have any problems or questions after your procedure. °What can I expect after the procedure? °After the procedure, it is common to have: °· Vomiting. °· A sore throat. °· Mental slowness. ° °It is common to feel: °· Nauseous. °· Cold or shivery. °· Sleepy. °· Tired. °· Sore or achy, even in parts of your body where you did not have surgery. ° °  Follow these instructions at home: °For at least 24 hours after the procedure: °· Do not: °? Participate in activities where you could fall or become injured. °? Drive. °? Use heavy machinery. °? Drink alcohol. °? Take sleeping pills or medicines that cause drowsiness. °? Make important decisions or sign legal documents. °? Take care of children on your own. °· Rest. °Eating and drinking °· If you vomit, drink water, juice, or soup when you can drink without  vomiting. °· Drink enough fluid to keep your urine clear or pale yellow. °· Make sure you have little or no nausea before eating solid foods. °· Follow the diet recommended by your health care provider. °General instructions °· Have a responsible adult stay with you until you are awake and alert. °· Return to your normal activities as told by your health care provider. Ask your health care provider what activities are safe for you. °· Take over-the-counter and prescription medicines only as told by your health care provider. °· If you smoke, do not smoke without supervision. °· Keep all follow-up visits as told by your health care provider. This is important. °Contact a health care provider if: °· You continue to have nausea or vomiting at home, and medicines are not helpful. °· You cannot drink fluids or start eating again. °· You cannot urinate after 8-12 hours. °· You develop a skin rash. °· You have fever. °· You have increasing redness at the site of your procedure. °Get help right away if: °· You have difficulty breathing. °· You have chest pain. °· You have unexpected bleeding. °· You feel that you are having a life-threatening or urgent problem. °This information is not intended to replace advice given to you by your health care provider. Make sure you discuss any questions you have with your health care provider. °Document Released: 06/08/2000 Document Revised: 08/05/2015 Document Reviewed: 02/14/2015 °Elsevier Interactive Patient Education © 2018 [employer] ° °

## 2016-12-01 ENCOUNTER — Ambulatory Visit: Payer: Medicare Other | Admitting: Anesthesiology

## 2016-12-01 ENCOUNTER — Encounter
Admission: RE | Disposition: A | Discharge status: Home / Self Care | Payer: Self-pay | Source: Ambulatory Visit | Attending: Ophthalmology

## 2016-12-01 ENCOUNTER — Ambulatory Visit
Admission: RE | Admit: 2016-12-01 | Discharge: 2016-12-01 | Disposition: A | Discharge status: Home / Self Care | Payer: Medicare Other | Source: Ambulatory Visit | Attending: Ophthalmology | Admitting: Ophthalmology

## 2016-12-01 DIAGNOSIS — Z79899 Other long term (current) drug therapy: Secondary | ICD-10-CM | POA: Insufficient documentation

## 2016-12-01 DIAGNOSIS — I10 Essential (primary) hypertension: Secondary | ICD-10-CM | POA: Insufficient documentation

## 2016-12-01 DIAGNOSIS — Z87440 Personal history of urinary (tract) infections: Secondary | ICD-10-CM | POA: Diagnosis not present

## 2016-12-01 DIAGNOSIS — H02831 Dermatochalasis of right upper eyelid: Secondary | ICD-10-CM | POA: Diagnosis not present

## 2016-12-01 DIAGNOSIS — H02834 Dermatochalasis of left upper eyelid: Secondary | ICD-10-CM | POA: Diagnosis not present

## 2016-12-01 DIAGNOSIS — Z7982 Long term (current) use of aspirin: Secondary | ICD-10-CM | POA: Diagnosis not present

## 2016-12-01 DIAGNOSIS — H02403 Unspecified ptosis of bilateral eyelids: Secondary | ICD-10-CM | POA: Diagnosis not present

## 2016-12-01 HISTORY — DX: Presence of dental prosthetic device (complete) (partial): Z97.2

## 2016-12-01 HISTORY — DX: Presence of external hearing-aid: Z97.4

## 2016-12-01 HISTORY — PX: BROW LIFT: SHX178

## 2016-12-01 HISTORY — DX: Dizziness and giddiness: R42

## 2016-12-01 HISTORY — PX: PTOSIS REPAIR: SHX6568

## 2016-12-01 SURGERY — BLEPHAROPLASTY
Anesthesia: General | Laterality: Bilateral | Wound class: Clean

## 2016-12-01 MED ORDER — FENTANYL CITRATE (PF) 100 MCG/2ML IJ SOLN: 25.0000 ug | INTRAMUSCULAR | Status: DC | PRN

## 2016-12-01 MED ORDER — TRAMADOL HCL 50 MG PO TABS: ORAL_TABLET | ORAL | 0 refills | Status: DC

## 2016-12-01 MED ORDER — MIDAZOLAM HCL 2 MG/2ML IJ SOLN
INTRAMUSCULAR | Status: DC | PRN
  Administered 2016-12-01: 1 mg via INTRAVENOUS

## 2016-12-01 MED ORDER — LIDOCAINE-EPINEPHRINE 2 %-1:100000 IJ SOLN
INTRAMUSCULAR | Status: DC | PRN
  Administered 2016-12-01: 2 mL via OPHTHALMIC

## 2016-12-01 MED ORDER — ERYTHROMYCIN 5 MG/GM OP OINT
TOPICAL_OINTMENT | OPHTHALMIC | Status: DC | PRN
  Administered 2016-12-01: 2 via OPHTHALMIC

## 2016-12-01 MED ORDER — TETRACAINE HCL 0.5 % OP SOLN
OPHTHALMIC | Status: DC | PRN
  Administered 2016-12-01: 2 [drp] via OPHTHALMIC

## 2016-12-01 MED ORDER — ALFENTANIL 500 MCG/ML IJ INJ
INJECTION | INTRAVENOUS | Status: DC | PRN
  Administered 2016-12-01: 500 ug via INTRAVENOUS

## 2016-12-01 MED ORDER — BSS IO SOLN
INTRAOCULAR | Status: DC | PRN
  Administered 2016-12-01: 15 mL via INTRAOCULAR

## 2016-12-01 MED ORDER — LACTATED RINGERS IV SOLN
10.0000 mL/h | INTRAVENOUS | Status: DC
  Administered 2016-12-01: 10 mL/h via INTRAVENOUS

## 2016-12-01 MED ORDER — MEPERIDINE HCL 25 MG/ML IJ SOLN: 6.2500 mg | INTRAMUSCULAR | Status: DC | PRN

## 2016-12-01 MED ORDER — ERYTHROMYCIN 5 MG/GM OP OINT: TOPICAL_OINTMENT | OPHTHALMIC | 3 refills | Status: DC

## 2016-12-01 SURGICAL SUPPLY — 34 items
APPLICATOR COTTON TIP WD 3 STR (MISCELLANEOUS) ×4 IMPLANT
BLADE SURG 15 STRL LF DISP TIS (BLADE) ×1 IMPLANT
BLADE SURG 15 STRL SS (BLADE) ×1
CORD BIP STRL DISP 12FT (MISCELLANEOUS) ×2 IMPLANT
DRAPE HEAD BAR (DRAPES) ×2 IMPLANT
GAUZE SPONGE 4X4 12PLY STRL (GAUZE/BANDAGES/DRESSINGS) ×2 IMPLANT
GAUZE SPONGE NON-WVN 2X2 STRL (MISCELLANEOUS) ×10 IMPLANT
GLOVE SURG LX 7.0 MICRO (GLOVE) ×2
GLOVE SURG LX STRL 7.0 MICRO (GLOVE) ×2 IMPLANT
MARKER SKIN XFINE TIP W/RULER (MISCELLANEOUS) ×2 IMPLANT
NEEDLE FILTER BLUNT 18X 1/2SAF (NEEDLE) ×1
NEEDLE FILTER BLUNT 18X1 1/2 (NEEDLE) ×1 IMPLANT
NEEDLE HYPO 30X.5 LL (NEEDLE) ×4 IMPLANT
PACK DRAPE NASAL/ENT (PACKS) ×2 IMPLANT
SOL PREP PVP 2OZ (MISCELLANEOUS) ×2
SOLUTION PREP PVP 2OZ (MISCELLANEOUS) ×1 IMPLANT
SPONGE VERSALON 2X2 STRL (MISCELLANEOUS) ×10
SUT CHROMIC 4-0 (SUTURE)
SUT CHROMIC 4-0 M2 12X2 ARM (SUTURE)
SUT ETHILON 4 0 CL P 3 (SUTURE) IMPLANT
SUT PDS AB 4-0 P3 18 (SUTURE) IMPLANT
SUT PLAIN GUT (SUTURE) ×2 IMPLANT
SUT PROLENE 5 0 P 3 (SUTURE) ×2 IMPLANT
SUT PROLENE 6 0 P 1 18 (SUTURE) ×2 IMPLANT
SUT SILK 4 0 G 3 (SUTURE) IMPLANT
SUT VIC AB 5-0 P-3 18X BRD (SUTURE) IMPLANT
SUT VIC AB 5-0 P3 18 (SUTURE)
SUT VICRYL 6-0  S14 CTD (SUTURE)
SUT VICRYL 6-0 S14 CTD (SUTURE) IMPLANT
SUT VICRYL 7 0 TG140 8 (SUTURE) IMPLANT
SUTURE CHRMC 4-0 M2 12X2 ARM (SUTURE) IMPLANT
SYR 3ML LL SCALE MARK (SYRINGE) ×2 IMPLANT
SYRINGE 10CC LL (SYRINGE) ×2 IMPLANT
WATER STERILE IRR 250ML POUR (IV SOLUTION) ×2 IMPLANT

## 2016-12-01 NOTE — Interval H&P Note (Signed)
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 After consideration of risks, benefits and other options for treatment, the patient has consented to  Procedure(s) with comments: BLEPHAROPLASTY upper eyelid with excess skin (Bilateral) - MAC PTOSIS REPAIR  repair resect ex (Bilateral) as a surgical intervention .  The patient's history has been reviewed, patient examined, no change in status, stable for surgery.  I have reviewed the patient's chart and labs.  Questions were answered to the patient's satisfaction.     Vickki Muff, Kristine Jacobson

## 2016-12-01 NOTE — Transfer of Care (Signed)
Immediate Anesthesia Transfer of Care Note  Patient: Kristine Jacobson  Procedure(s) Performed: Procedure(s) with comments: BLEPHAROPLASTY upper eyelid with excess skin (Bilateral) - MAC PTOSIS REPAIR  repair resect ex (Bilateral)  Patient Location: PACU  Anesthesia Type: General  Level of Consciousness: awake, alert  and patient cooperative  Airway and Oxygen Therapy: Patient Spontanous Breathing and Patient connected to supplemental oxygen  Post-op Assessment: Post-op Vital signs reviewed, Patient's Cardiovascular Status Stable, Respiratory Function Stable, Patent Airway and No signs of Nausea or vomiting  Post-op Vital Signs: Reviewed and stable  Complications: No apparent anesthesia complications

## 2016-12-01 NOTE — Anesthesia Postprocedure Evaluation (Signed)
Anesthesia Post Note  Patient: Kristee Angus Usery  Procedure(s) Performed: Procedure(s) (LRB): BLEPHAROPLASTY upper eyelid with excess skin (Bilateral) PTOSIS REPAIR  repair resect ex (Bilateral)  Patient location during evaluation: PACU Anesthesia Type: General Level of consciousness: awake and alert Pain management: pain level controlled Vital Signs Assessment: post-procedure vital signs reviewed and stable Respiratory status: spontaneous breathing, nonlabored ventilation, respiratory function stable and patient connected to nasal cannula oxygen Cardiovascular status: blood pressure returned to baseline and stable Postop Assessment: no apparent nausea or vomiting Anesthetic complications: no    SCOURAS, NICOLE ELAINE

## 2016-12-01 NOTE — H&P (Signed)
See the history and physical completed at [facility] on 11/09/16 and scanned into the chart.

## 2016-12-01 NOTE — Anesthesia Preprocedure Evaluation (Signed)
Anesthesia Evaluation  Patient identified by MRN, date of birth, ID band Patient awake    Reviewed: Allergy & Precautions, H&P , NPO status , Patient's Chart, lab work & pertinent test results, reviewed documented beta blocker date and time   History of Anesthesia Complications Negative for: history of anesthetic complications  Airway Mallampati: II  TM Distance: >3 FB Neck ROM: full    Dental no notable dental hx. (+) Upper Dentures, Lower Dentures   Pulmonary neg pulmonary ROS,    Pulmonary exam normal breath sounds clear to auscultation       Cardiovascular Exercise Tolerance: Good hypertension, negative cardio ROS   Rhythm:regular Rate:Normal     Neuro/Psych RLS Spinal stenosis Osteoporosis Arthritis negative psych ROS   GI/Hepatic negative GI ROS, Neg liver ROS,   Endo/Other  negative endocrine ROS  Renal/GU negative Renal ROS  negative genitourinary   Musculoskeletal   Abdominal   Peds  Hematology negative hematology ROS (+)   Anesthesia Other Findings   Reproductive/Obstetrics negative OB ROS                             Anesthesia Physical Anesthesia Plan  ASA: II  Anesthesia Plan: General   Post-op Pain Management:    Induction:   PONV Risk Score and Plan:   Airway Management Planned:   Additional Equipment:   Intra-op Plan:   Post-operative Plan:   Informed Consent: I have reviewed the patients History and Physical, chart, labs and discussed the procedure including the risks, benefits and alternatives for the proposed anesthesia with the patient or authorized representative who has indicated his/her understanding and acceptance.   Dental Advisory Given  Plan Discussed with: CRNA  Anesthesia Plan Comments:         Anesthesia Quick Evaluation

## 2016-12-01 NOTE — Interval H&P Note (Signed)
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 After consideration of risks, benefits and other options for treatment, the patient has consented to  Procedure(s) with comments: BLEPHAROPLASTY upper eyelid with excess skin (Bilateral) - MAC PTOSIS REPAIR  repair resect ex (Bilateral) as a surgical intervention .  The patient's history has been reviewed, patient examined, no change in status, stable for surgery.  I have reviewed the patient's chart and labs.  Questions were answered to the patient's satisfaction.     Vickki Muff, Shadasia Oldfield M

## 2016-12-01 NOTE — Anesthesia Procedure Notes (Signed)
Procedure Name: MAC Date/Time: 12/01/2016 8:59 AM Performed by: Cameron Ali Pre-anesthesia Checklist: Patient identified, Emergency Drugs available, Suction available, Timeout performed and Patient being monitored Patient Re-evaluated:Patient Re-evaluated prior to induction Oxygen Delivery Method: Nasal cannula Placement Confirmation: positive ETCO2

## 2016-12-01 NOTE — Op Note (Signed)
Preoperative Diagnosis:  1. Visually significant blepharoptosis bilateral Upper Eyelid(s) 2. Visually significant dermatochalasis bilateral Upper Eyelid(s)  Postoperative Diagnosis:  Same.  Procedure(s) Performed:   1. Blepharoptosis repair with levator aponeurosis advancement bilateral Upper Eyelid(s) 2. Upper eyelid blepharoplasty with excess skin excision  bilateral Upper Eyelid(s)  Surgeon: Philis Pique. Vickki Muff, M.D.  Assistants: none  Anesthesia: MAC  Specimens: None.  Estimated Blood Loss: Minimal.  Complications: None.  Operative Findings: None Dictated  Procedure:   Allergies were reviewed and the patient Codeine.   After the risks, benefits, complications and alternatives were discussed with the patient, appropriate informed consent was obtained.  While seated in an upright position and looking in primary gaze, the mid pupillary line was marked on the upper eyelid margins bilaterally. The patient was then brought to the operating suite and reclined supine.  Timeout was conducted and the patient was sedated.  Local anesthetic consisting of a 50-50 mixture of 2% lidocaine with epinephrine and 0.75% bupivacaine with added Hylenex was injected subcutaneously to both upper eyelid(s). After adequate local was instilled, the patient was prepped and draped in the usual sterile fashion for eyelid surgery.   Attention was turned to the upper eyelids. A 8 mm upper eyelid crease incision line was marked with calipers on both upper eyelid(s).  A pinch test was used to estimate the amount of excess skin to remove and this was marked in standard blepharoplasty style fashion. Attention was turned to the  right upper eyelid. A #15 blade was used to open the premarked incision line. A skin only flap was excised and hemostasis was obtained with bipolar cautery.    Westcott scissors were then used to transect through orbicularis down to the tarsal plate. Epitarsus was dissected to create a smooth  surface to suture to. Dissection was then carried superiorly in the plane between orbicularis and orbital septum. Once the preaponeurotic fat pocket was identified, the orbital septum was opened. This revealed the levator and its aponeurosis.    Attention was then turned to the opposite eyelid where the same procedure was performed in the same manner. Hemostasis was obtained with bipolar cautery throughout.   3 interrupted 6-0 Prolene sutures were then passed partial thickness through the tarsal plates of both upper eyelid(s). These sutures were placed in line with the mid pupillary, medial limbal, and lateral limbal lines. The sutures were fixed to the levator aponeurosis and adjusted until a nice lid height and contour were achieved. Once nice symmetry was achieved, the skin incisions were closed with a running 6-0 fast absorbing plain suture. The patient tolerated the procedure well.  Erythromycin ophthalmic ointment was applied to the incision site(s) followed by ice packs. The patient was taken to the recovery area where she recovered without difficulty.  It should be noted that the patient already was developing significant bruising at the end of the case. No active bleeding was noted.  Post-Op Plan/Instructions:  The patient was instructed to use ice packs frequently for the next 48 hours. She was instructed to use erythromycin ophthalmic ointment on her incisions 4 times a day for the next 12 to 14 days. Shewas given a prescription for Percocet for pain control should Tylenol not be effective. She was asked to to follow up at the [facility] in [place], [place] in 2 weeks' time or sooner as needed for problems.  Nayvie Lips M. Vickki Muff, M.D. Attending,Ophthalmology

## 2016-12-02 ENCOUNTER — Encounter: Payer: Self-pay | Admitting: Ophthalmology

## 2016-12-04 DIAGNOSIS — R350 Frequency of micturition: Secondary | ICD-10-CM | POA: Diagnosis not present

## 2016-12-16 DIAGNOSIS — Z23 Encounter for immunization: Secondary | ICD-10-CM | POA: Diagnosis not present

## 2016-12-23 DIAGNOSIS — H353221 Exudative age-related macular degeneration, left eye, with active choroidal neovascularization: Secondary | ICD-10-CM | POA: Diagnosis not present

## 2017-01-01 DIAGNOSIS — R350 Frequency of micturition: Secondary | ICD-10-CM | POA: Diagnosis not present

## 2017-01-11 DIAGNOSIS — N390 Urinary tract infection, site not specified: Secondary | ICD-10-CM | POA: Diagnosis not present

## 2017-01-11 DIAGNOSIS — J069 Acute upper respiratory infection, unspecified: Secondary | ICD-10-CM | POA: Diagnosis not present

## 2017-01-11 DIAGNOSIS — J1189 Influenza due to unidentified influenza virus with other manifestations: Secondary | ICD-10-CM | POA: Diagnosis not present

## 2017-01-25 DIAGNOSIS — Z87440 Personal history of urinary (tract) infections: Secondary | ICD-10-CM | POA: Diagnosis not present

## 2017-01-25 DIAGNOSIS — M1990 Unspecified osteoarthritis, unspecified site: Secondary | ICD-10-CM | POA: Diagnosis not present

## 2017-01-25 DIAGNOSIS — K5909 Other constipation: Secondary | ICD-10-CM | POA: Diagnosis not present

## 2017-01-25 DIAGNOSIS — R143 Flatulence: Secondary | ICD-10-CM | POA: Diagnosis not present

## 2017-01-25 DIAGNOSIS — K56609 Unspecified intestinal obstruction, unspecified as to partial versus complete obstruction: Secondary | ICD-10-CM | POA: Diagnosis not present

## 2017-01-25 DIAGNOSIS — R1084 Generalized abdominal pain: Secondary | ICD-10-CM | POA: Diagnosis not present

## 2017-01-25 DIAGNOSIS — Z66 Do not resuscitate: Secondary | ICD-10-CM | POA: Diagnosis not present

## 2017-01-25 DIAGNOSIS — R140 Abdominal distension (gaseous): Secondary | ICD-10-CM | POA: Diagnosis not present

## 2017-01-25 DIAGNOSIS — R1110 Vomiting, unspecified: Secondary | ICD-10-CM | POA: Diagnosis not present

## 2017-01-25 DIAGNOSIS — R3915 Urgency of urination: Secondary | ICD-10-CM | POA: Diagnosis not present

## 2017-01-25 DIAGNOSIS — R109 Unspecified abdominal pain: Secondary | ICD-10-CM | POA: Diagnosis not present

## 2017-01-25 DIAGNOSIS — R933 Abnormal findings on diagnostic imaging of other parts of digestive tract: Secondary | ICD-10-CM | POA: Diagnosis not present

## 2017-01-25 DIAGNOSIS — A084 Viral intestinal infection, unspecified: Secondary | ICD-10-CM | POA: Diagnosis not present

## 2017-01-25 DIAGNOSIS — F0390 Unspecified dementia without behavioral disturbance: Secondary | ICD-10-CM | POA: Diagnosis not present

## 2017-01-25 DIAGNOSIS — G629 Polyneuropathy, unspecified: Secondary | ICD-10-CM | POA: Diagnosis not present

## 2017-01-25 DIAGNOSIS — N390 Urinary tract infection, site not specified: Secondary | ICD-10-CM | POA: Diagnosis not present

## 2017-01-25 DIAGNOSIS — R1031 Right lower quadrant pain: Secondary | ICD-10-CM | POA: Diagnosis not present

## 2017-01-25 DIAGNOSIS — G2581 Restless legs syndrome: Secondary | ICD-10-CM | POA: Diagnosis not present

## 2017-01-25 DIAGNOSIS — K219 Gastro-esophageal reflux disease without esophagitis: Secondary | ICD-10-CM | POA: Diagnosis not present

## 2017-01-25 DIAGNOSIS — I10 Essential (primary) hypertension: Secondary | ICD-10-CM | POA: Diagnosis not present

## 2017-01-25 DIAGNOSIS — Z7983 Long term (current) use of bisphosphonates: Secondary | ICD-10-CM | POA: Diagnosis not present

## 2017-01-25 DIAGNOSIS — R1032 Left lower quadrant pain: Secondary | ICD-10-CM | POA: Diagnosis not present

## 2017-01-25 DIAGNOSIS — R0789 Other chest pain: Secondary | ICD-10-CM | POA: Diagnosis not present

## 2017-01-25 DIAGNOSIS — R918 Other nonspecific abnormal finding of lung field: Secondary | ICD-10-CM | POA: Diagnosis not present

## 2017-01-25 DIAGNOSIS — K56699 Other intestinal obstruction unspecified as to partial versus complete obstruction: Secondary | ICD-10-CM | POA: Diagnosis not present

## 2017-01-25 DIAGNOSIS — M4800 Spinal stenosis, site unspecified: Secondary | ICD-10-CM | POA: Diagnosis not present

## 2017-01-25 DIAGNOSIS — K56600 Partial intestinal obstruction, unspecified as to cause: Secondary | ICD-10-CM | POA: Diagnosis not present

## 2017-01-26 DIAGNOSIS — R1032 Left lower quadrant pain: Secondary | ICD-10-CM | POA: Diagnosis not present

## 2017-01-26 DIAGNOSIS — G629 Polyneuropathy, unspecified: Secondary | ICD-10-CM | POA: Diagnosis not present

## 2017-01-26 DIAGNOSIS — Z87440 Personal history of urinary (tract) infections: Secondary | ICD-10-CM | POA: Diagnosis not present

## 2017-01-26 DIAGNOSIS — R109 Unspecified abdominal pain: Secondary | ICD-10-CM | POA: Diagnosis not present

## 2017-01-26 DIAGNOSIS — R933 Abnormal findings on diagnostic imaging of other parts of digestive tract: Secondary | ICD-10-CM | POA: Diagnosis not present

## 2017-01-26 DIAGNOSIS — R1031 Right lower quadrant pain: Secondary | ICD-10-CM | POA: Diagnosis not present

## 2017-01-27 DIAGNOSIS — G629 Polyneuropathy, unspecified: Secondary | ICD-10-CM | POA: Diagnosis not present

## 2017-01-27 DIAGNOSIS — K56609 Unspecified intestinal obstruction, unspecified as to partial versus complete obstruction: Secondary | ICD-10-CM | POA: Diagnosis not present

## 2017-01-27 DIAGNOSIS — Z87440 Personal history of urinary (tract) infections: Secondary | ICD-10-CM | POA: Diagnosis not present

## 2017-01-27 DIAGNOSIS — R1031 Right lower quadrant pain: Secondary | ICD-10-CM | POA: Diagnosis not present

## 2017-01-27 DIAGNOSIS — R0789 Other chest pain: Secondary | ICD-10-CM | POA: Diagnosis not present

## 2017-01-27 DIAGNOSIS — R140 Abdominal distension (gaseous): Secondary | ICD-10-CM | POA: Diagnosis not present

## 2017-01-27 DIAGNOSIS — R109 Unspecified abdominal pain: Secondary | ICD-10-CM | POA: Diagnosis not present

## 2017-01-27 DIAGNOSIS — R1032 Left lower quadrant pain: Secondary | ICD-10-CM | POA: Diagnosis not present

## 2017-01-28 DIAGNOSIS — R1032 Left lower quadrant pain: Secondary | ICD-10-CM | POA: Diagnosis not present

## 2017-01-28 DIAGNOSIS — R140 Abdominal distension (gaseous): Secondary | ICD-10-CM | POA: Diagnosis not present

## 2017-01-28 DIAGNOSIS — R1031 Right lower quadrant pain: Secondary | ICD-10-CM | POA: Diagnosis not present

## 2017-01-28 DIAGNOSIS — Z87440 Personal history of urinary (tract) infections: Secondary | ICD-10-CM | POA: Diagnosis not present

## 2017-01-28 DIAGNOSIS — G629 Polyneuropathy, unspecified: Secondary | ICD-10-CM | POA: Diagnosis not present

## 2017-01-28 DIAGNOSIS — R109 Unspecified abdominal pain: Secondary | ICD-10-CM | POA: Diagnosis not present

## 2017-01-29 DIAGNOSIS — R143 Flatulence: Secondary | ICD-10-CM | POA: Diagnosis not present

## 2017-01-29 DIAGNOSIS — R1110 Vomiting, unspecified: Secondary | ICD-10-CM | POA: Diagnosis not present

## 2017-01-29 DIAGNOSIS — Z87440 Personal history of urinary (tract) infections: Secondary | ICD-10-CM | POA: Diagnosis not present

## 2017-01-29 DIAGNOSIS — R109 Unspecified abdominal pain: Secondary | ICD-10-CM | POA: Diagnosis not present

## 2017-02-02 DIAGNOSIS — R112 Nausea with vomiting, unspecified: Secondary | ICD-10-CM | POA: Diagnosis not present

## 2017-02-02 DIAGNOSIS — K2900 Acute gastritis without bleeding: Secondary | ICD-10-CM | POA: Diagnosis not present

## 2017-02-02 DIAGNOSIS — I10 Essential (primary) hypertension: Secondary | ICD-10-CM | POA: Diagnosis not present

## 2017-02-17 DIAGNOSIS — H43813 Vitreous degeneration, bilateral: Secondary | ICD-10-CM | POA: Diagnosis not present

## 2017-02-17 DIAGNOSIS — H353221 Exudative age-related macular degeneration, left eye, with active choroidal neovascularization: Secondary | ICD-10-CM | POA: Diagnosis not present

## 2017-03-03 ENCOUNTER — Ambulatory Visit: Payer: Medicare Other | Admitting: Internal Medicine

## 2017-03-03 ENCOUNTER — Encounter: Payer: Self-pay | Admitting: Internal Medicine

## 2017-03-03 VITALS — BP 130/58 | HR 69 | Resp 16 | Ht 67.0 in | Wt 153.6 lb

## 2017-03-03 DIAGNOSIS — Z0001 Encounter for general adult medical examination with abnormal findings: Secondary | ICD-10-CM

## 2017-03-03 DIAGNOSIS — Z1231 Encounter for screening mammogram for malignant neoplasm of breast: Secondary | ICD-10-CM

## 2017-03-03 DIAGNOSIS — I10 Essential (primary) hypertension: Secondary | ICD-10-CM | POA: Diagnosis not present

## 2017-03-03 DIAGNOSIS — M1990 Unspecified osteoarthritis, unspecified site: Secondary | ICD-10-CM | POA: Diagnosis not present

## 2017-03-03 DIAGNOSIS — M545 Low back pain, unspecified: Secondary | ICD-10-CM | POA: Insufficient documentation

## 2017-03-03 DIAGNOSIS — N3020 Other chronic cystitis without hematuria: Secondary | ICD-10-CM | POA: Diagnosis not present

## 2017-03-03 DIAGNOSIS — M4800 Spinal stenosis, site unspecified: Secondary | ICD-10-CM | POA: Insufficient documentation

## 2017-03-03 DIAGNOSIS — H3530 Unspecified macular degeneration: Secondary | ICD-10-CM | POA: Insufficient documentation

## 2017-03-03 DIAGNOSIS — G2581 Restless legs syndrome: Secondary | ICD-10-CM | POA: Insufficient documentation

## 2017-03-03 DIAGNOSIS — E782 Mixed hyperlipidemia: Secondary | ICD-10-CM | POA: Diagnosis not present

## 2017-03-03 DIAGNOSIS — Z1239 Encounter for other screening for malignant neoplasm of breast: Secondary | ICD-10-CM

## 2017-03-03 DIAGNOSIS — N3281 Overactive bladder: Secondary | ICD-10-CM | POA: Insufficient documentation

## 2017-03-03 NOTE — Progress Notes (Signed)
[facility] [place], [place] [address]  Internal MEDICINE  Office Visit Note  Patient Name: Kristine Jacobson  [id number]  [id number]  Date of Service: 03/03/2017 Chief Complaint  Patient presents with  . Annual Exam  . Leg Pain    left leg pain goes up into hip   HPI Pt is here for routine health maintenance examination: She has been feeling well She just had a lid retraction No recent uti  Current Medication: Outpatient Encounter Medications as of 03/03/2017  Medication Sig Note  . acetaminophen (TYLENOL) 500 MG tablet Take 1,000 mg by mouth every 6 (six) hours as needed (pain).   Marland Kitchen amLODipine (NORVASC) 2.5 MG tablet Take 7.5 mg by mouth daily with lunch.    Marland Kitchen aspirin EC 81 MG tablet Take 81 mg by mouth at bedtime.   . bisacodyl (BISACODYL) 5 MG EC tablet Take 5 mg by mouth 2 (two) times a week. Take 1 tablet (5 mg) by mouth on Sunday and Wednesday nights (the night before Linzess dose)   . Calcium Carb-Cholecalciferol (CALCIUM 600 + D PO) Take 600 mg by mouth 2 (two) times daily.   . carvedilol (COREG) 3.125 MG tablet Take 3.125 mg by mouth 2 (two) times daily with a meal.   . diphenhydrAMINE (BENADRYL) 25 MG tablet Take 25 mg by mouth daily as needed.   . doxycycline (VIBRAMYCIN) 100 MG capsule Take 100 mg by mouth daily with lunch. Continuous course for UTI prevention   . gabapentin (NEURONTIN) 100 MG capsule Take 100-300 mg by mouth 3 (three) times daily. Take 2 capsules (200 mg) by mouth every morning, 1 capsule (100 mg) with lunch and 3 capsules (300 mg) at bedtime   . linaclotide (LINZESS) 145 MCG CAPS capsule Take 145 mcg by mouth 2 (two) times a week. Take 1 capsule (145 mcg) by mouth on Mondays and Thursdays at 2pm   . losartan-hydrochlorothiazide (HYZAAR) 100-12.5 MG per tablet Take 1 tablet by mouth daily.   . Multiple Vitamin (MULTIVITAMIN WITH MINERALS) TABS tablet Take 1 tablet by mouth daily. Centrum Silver   . Multiple Vitamins-Minerals  (PRESERVISION AREDS 2) CAPS Take 1 capsule by mouth 2 (two) times daily.   Marland Kitchen Propylene Glycol (SYSTANE BALANCE OP) Place 1 drop into both eyes 3 (three) times daily as needed (dry eyes).   Marland Kitchen rOPINIRole (REQUIP) 0.5 MG tablet Take 0.5 mg by mouth 3 (three) times daily.   . traMADol (ULTRAM) 50 MG tablet Take 1 every 4-6 hours as needed for pain not controlled by Tylenol   . erythromycin [facility]) ophthalmic ointment Use a small amount on your sutures 4 times a day for the next 2 weeks. Switch to Aquaphor ointment should allergy develop. (Patient not taking: Reported on 03/03/2017)   . traMADol (ULTRAM) 50 MG tablet Take 1 tablet (50 mg total) by mouth every 6 (six) hours as needed for moderate pain. (Patient not taking: Reported on 03/03/2017) 12/01/2016: hasnt used in several months   No facility-administered encounter medications on file as of 03/03/2017.     Surgical History: Past Surgical History:  Procedure Laterality Date  . ABDOMINAL HYSTERECTOMY    . BLADDER SURGERY     bladder suspension  . BREAST EXCISIONAL BIOPSY Right 1970   neg  . BROW LIFT Bilateral 12/01/2016   Procedure: BLEPHAROPLASTY upper eyelid with excess skin;  Surgeon: Karle Starch, MD;  Location: [place];  Service: Ophthalmology;  Laterality: Bilateral;  MAC  . Esophageal  dilitation    . FRACTURE SURGERY    . INTRAMEDULLARY (IM) NAIL INTERTROCHANTERIC Left 02/27/2015   Procedure: INTRAMEDULLARY (IM) NAIL INTERTROCHANTRIC;  Surgeon: Dereck Leep, MD;  Location: [facility];  Service: Orthopedics;  Laterality: Left;  . PTOSIS REPAIR Bilateral 12/01/2016   Procedure: PTOSIS REPAIR  repair resect ex;  Surgeon: Karle Starch, MD;  Location: [place];  Service: Ophthalmology;  Laterality: Bilateral;  . TONSILLECTOMY      Medical History: Past Medical History:  Diagnosis Date  . Arthritis    shoulders, hands  . HBP (high blood pressure)   . Hearing aid worn    bilateral  . Macular degeneration,  age related   . Osteoporosis   . Overactive bladder   . Restless leg   . Spinal stenosis   . Vertigo    no episodes for several years  . Wears dentures    partial upper and lower    Family History: Family History  Problem Relation Age of Onset  . Diabetes Mother    Review of Systems  Constitutional: Negative for activity change, appetite change, chills, diaphoresis, fatigue, fever and unexpected weight change.  HENT: Negative for congestion, ear discharge, ear pain, facial swelling, hearing loss, nosebleeds, postnasal drip, rhinorrhea, sinus pressure, sinus pain, sneezing, sore throat, tinnitus, trouble swallowing and voice change.   Eyes: Negative for photophobia, pain, discharge, redness, itching and visual disturbance.  Respiratory: Negative for apnea, cough, choking, chest tightness, shortness of breath, wheezing and stridor.   Cardiovascular: Negative for chest pain, palpitations and leg swelling.  Gastrointestinal: Negative for abdominal distention, abdominal pain, anal bleeding, constipation, diarrhea, nausea and rectal pain.  Endocrine: Negative for cold intolerance, heat intolerance, polydipsia, polyphagia and polyuria.  Genitourinary: Negative for difficulty urinating, flank pain, frequency, genital sores, hematuria, menstrual problem, pelvic pain, urgency, vaginal bleeding, vaginal discharge and vaginal pain.  Musculoskeletal: Positive for arthralgias, back pain, gait problem, myalgias and neck pain. Negative for joint swelling.  Skin: Negative for color change, pallor, rash and wound.  Allergic/Immunologic: Negative for environmental allergies, food allergies and immunocompromised state.  Neurological: Negative for dizziness, seizures, syncope, facial asymmetry, speech difficulty, weakness, light-headedness, numbness and headaches.  Hematological: Negative for adenopathy. Does not bruise/bleed easily.  Psychiatric/Behavioral: Negative for agitation, behavioral problems,  confusion, decreased concentration, dysphoric mood, hallucinations, self-injury, sleep disturbance and suicidal ideas. The patient is not nervous/anxious and is not hyperactive.     Vital Signs: Blood pressure (!) 130/58, pulse 69, resp. rate 16, height _0  (1.702 m), weight 153 lb 9.6 oz (69.7 kg), SpO2 98 %.  Physical Exam  Constitutional: She is oriented to person, place, and time. She appears well-developed and well-nourished.  HENT:  Head: Normocephalic and atraumatic.  Right Ear: External ear normal.  Left Ear: External ear normal.  Nose: Nose normal.  Mouth/Throat: Oropharynx is clear and moist.  Eyes: Conjunctivae and EOM are normal. Pupils are equal, round, and reactive to light.  Neck: Normal range of motion. No JVD present. No thyromegaly present.  Cardiovascular: Normal rate, regular rhythm, normal heart sounds and intact distal pulses. Exam reveals no gallop.  No murmur heard. Pulmonary/Chest: Effort normal and breath sounds normal. No stridor. No respiratory distress. She has no rales. Right breast exhibits no mass and no skin change. Left breast exhibits no mass and no skin change. There is no breast swelling.  Abdominal: Soft.  Genitourinary: No breast tenderness.  Musculoskeletal: She exhibits tenderness and deformity.  Lymphadenopathy:    She has  no cervical adenopathy.  Neurological: She is alert and oriented to person, place, and time. She displays abnormal reflex. She exhibits abnormal muscle tone. Coordination abnormal.   Assessment and Plan: Patient Active Problem List   Diagnosis Date Noted  . Hypertension 03/03/2017  . Macular degeneration 03/03/2017  . Overactive bladder 03/03/2017  . Restless leg syndrome 03/03/2017  . Spinal stenosis 03/03/2017  . Osteoarthritis 03/03/2017  . Low back pain 03/03/2017  . Mixed hyperlipidemia 03/03/2017  . Closed displaced intertrochanteric fracture of left femur with routine healing 05/26/2015  . Fracture of left hip  requiring operative repair ([place]) 04/14/2015  . Closed left hip fracture ([place]) 02/27/2015  . SUI (stress urinary incontinence, female) 09/21/2014  . Flank pain, acute 11/22/2013  . Chronic cystitis 04/12/2013  . Incomplete emptying of bladder 04/12/2013  . Increased frequency of urination 04/12/2013  . Urge incontinence 04/12/2013   Assessment/plan 1. Encounter for routine adult physical exam with abnormal findings - Discussed routine PHM. - Urinalysis, Routine w reflex microscopic - Urinalysis  2. Osteoarthritis, unspecified osteoarthritis type, unspecified site - Walker rolling for ADL'S and ambulation and to prevent falls   3. Mixed hyperlipidemia - Lipid Panel With LDL/HDL Ratio  4. Restless leg syndrome - Continue Requip - CBC with Differential/Platelet - TSH - T4, free - Comprehensive metabolic panel  5. Screening breast examination - MM Digital Screening; Future  6. Essential hypertension - Continue all medications as before   7. Chronic cystitis - Stable. Sees urology   General Counseling: I have discussed the findings of the evaluation and examination with Miria.  I have also discussed any further diagnostic evaluation thatmay be needed or ordered today. Almira verbalizes understanding of the findings of todays visit. We also reviewed her medications today and discussed drug interactions and side effects including but not limited excessive drowsiness and altered mental states. We also discussed that there is always a risk not just to her but also people around her. she has been encouraged to call the office with any questions or concerns that should arise related to todays visit.    Time spent: 43  I have personally obtained a history, examined the patient, evaluated laboratory and imaging results, formulated the assessment and plan and placed orders.   Lavera Guise, MD  Internal Medicine

## 2017-03-04 LAB — MICROSCOPIC EXAMINATION
BACTERIA UA: NONE SEEN
CASTS: NONE SEEN /LPF

## 2017-03-04 LAB — URINALYSIS, ROUTINE W REFLEX MICROSCOPIC
Bilirubin, UA: NEGATIVE
Glucose, UA: NEGATIVE
Ketones, UA: NEGATIVE
NITRITE UA: NEGATIVE
Protein, UA: NEGATIVE
RBC, UA: NEGATIVE
Specific Gravity, UA: 1.012 (ref 1.005–1.030)
Urobilinogen, Ur: 0.2 mg/dL (ref 0.2–1.0)
pH, UA: 5 (ref 5.0–7.5)

## 2017-03-05 DIAGNOSIS — B351 Tinea unguium: Secondary | ICD-10-CM | POA: Diagnosis not present

## 2017-03-05 DIAGNOSIS — L97511 Non-pressure chronic ulcer of other part of right foot limited to breakdown of skin: Secondary | ICD-10-CM | POA: Diagnosis not present

## 2017-03-05 DIAGNOSIS — L03032 Cellulitis of left toe: Secondary | ICD-10-CM | POA: Diagnosis not present

## 2017-03-05 DIAGNOSIS — M79674 Pain in right toe(s): Secondary | ICD-10-CM | POA: Diagnosis not present

## 2017-03-10 ENCOUNTER — Telehealth: Payer: Self-pay

## 2017-03-10 NOTE — Telephone Encounter (Signed)
Spoke to pt and asked about walker prescription on which type pt needed.  Pt advised that she didn't need the prescription for a walker anymore bc she has already gotten a walker.  dbs

## 2017-03-15 DIAGNOSIS — N3941 Urge incontinence: Secondary | ICD-10-CM | POA: Diagnosis not present

## 2017-03-26 DIAGNOSIS — I10 Essential (primary) hypertension: Secondary | ICD-10-CM | POA: Diagnosis not present

## 2017-03-26 DIAGNOSIS — R112 Nausea with vomiting, unspecified: Secondary | ICD-10-CM | POA: Diagnosis not present

## 2017-03-26 DIAGNOSIS — K2900 Acute gastritis without bleeding: Secondary | ICD-10-CM | POA: Diagnosis not present

## 2017-04-01 DIAGNOSIS — N3020 Other chronic cystitis without hematuria: Secondary | ICD-10-CM | POA: Diagnosis not present

## 2017-04-01 DIAGNOSIS — N3941 Urge incontinence: Secondary | ICD-10-CM | POA: Diagnosis not present

## 2017-04-01 DIAGNOSIS — R350 Frequency of micturition: Secondary | ICD-10-CM | POA: Diagnosis not present

## 2017-04-01 DIAGNOSIS — R339 Retention of urine, unspecified: Secondary | ICD-10-CM | POA: Diagnosis not present

## 2017-04-14 DIAGNOSIS — Z6826 Body mass index (BMI) 26.0-26.9, adult: Secondary | ICD-10-CM | POA: Diagnosis not present

## 2017-04-14 DIAGNOSIS — M5416 Radiculopathy, lumbar region: Secondary | ICD-10-CM | POA: Diagnosis not present

## 2017-04-14 DIAGNOSIS — I10 Essential (primary) hypertension: Secondary | ICD-10-CM | POA: Diagnosis not present

## 2017-04-23 DIAGNOSIS — I739 Peripheral vascular disease, unspecified: Secondary | ICD-10-CM | POA: Diagnosis not present

## 2017-04-23 DIAGNOSIS — I872 Venous insufficiency (chronic) (peripheral): Secondary | ICD-10-CM | POA: Diagnosis not present

## 2017-04-28 DIAGNOSIS — H353221 Exudative age-related macular degeneration, left eye, with active choroidal neovascularization: Secondary | ICD-10-CM | POA: Diagnosis not present

## 2017-05-10 DIAGNOSIS — N3941 Urge incontinence: Secondary | ICD-10-CM | POA: Diagnosis not present

## 2017-05-18 ENCOUNTER — Telehealth: Payer: Self-pay

## 2017-05-18 NOTE — Telephone Encounter (Signed)
CALLED PATIENT TO ADVISE THEM OF THEIR NEW APPOINTMENT TIME AND DAY, LEFT MESSAGE ON VM.

## 2017-06-02 ENCOUNTER — Telehealth: Payer: Self-pay

## 2017-06-02 NOTE — Telephone Encounter (Signed)
Copied from [place] [id number]. Topic: Appointment Scheduling - New Patient >> Jun 02, 2017  9:33 AM Vernona Rieger wrote: New patient has been scheduled for your office. Provider: Aundra Dubin Date of Appointment: 4/15  Route to department's PEC pool.

## 2017-06-07 DIAGNOSIS — N3941 Urge incontinence: Secondary | ICD-10-CM | POA: Diagnosis not present

## 2017-06-28 ENCOUNTER — Ambulatory Visit: Payer: Medicare Other | Admitting: Internal Medicine

## 2017-06-28 ENCOUNTER — Ambulatory Visit (INDEPENDENT_AMBULATORY_CARE_PROVIDER_SITE_OTHER): Payer: Medicare Other

## 2017-06-28 ENCOUNTER — Encounter: Payer: Self-pay | Admitting: Internal Medicine

## 2017-06-28 VITALS — BP 118/56 | HR 68 | Temp 98.2°F | Ht 67.0 in | Wt 155.4 lb

## 2017-06-28 DIAGNOSIS — M4807 Spinal stenosis, lumbosacral region: Secondary | ICD-10-CM | POA: Diagnosis not present

## 2017-06-28 DIAGNOSIS — M25552 Pain in left hip: Secondary | ICD-10-CM

## 2017-06-28 DIAGNOSIS — M4802 Spinal stenosis, cervical region: Secondary | ICD-10-CM | POA: Diagnosis not present

## 2017-06-28 DIAGNOSIS — M25562 Pain in left knee: Secondary | ICD-10-CM

## 2017-06-28 DIAGNOSIS — M47812 Spondylosis without myelopathy or radiculopathy, cervical region: Secondary | ICD-10-CM | POA: Diagnosis not present

## 2017-06-28 DIAGNOSIS — G8929 Other chronic pain: Secondary | ICD-10-CM

## 2017-06-28 DIAGNOSIS — Z1329 Encounter for screening for other suspected endocrine disorder: Secondary | ICD-10-CM | POA: Diagnosis not present

## 2017-06-28 DIAGNOSIS — M1712 Unilateral primary osteoarthritis, left knee: Secondary | ICD-10-CM | POA: Diagnosis not present

## 2017-06-28 DIAGNOSIS — R269 Unspecified abnormalities of gait and mobility: Secondary | ICD-10-CM

## 2017-06-28 DIAGNOSIS — G629 Polyneuropathy, unspecified: Secondary | ICD-10-CM | POA: Diagnosis not present

## 2017-06-28 DIAGNOSIS — I10 Essential (primary) hypertension: Secondary | ICD-10-CM | POA: Diagnosis not present

## 2017-06-28 DIAGNOSIS — E782 Mixed hyperlipidemia: Secondary | ICD-10-CM

## 2017-06-28 DIAGNOSIS — K5909 Other constipation: Secondary | ICD-10-CM

## 2017-06-28 DIAGNOSIS — E559 Vitamin D deficiency, unspecified: Secondary | ICD-10-CM

## 2017-06-28 DIAGNOSIS — S79912A Unspecified injury of left hip, initial encounter: Secondary | ICD-10-CM | POA: Diagnosis not present

## 2017-06-28 MED ORDER — LINACLOTIDE 145 MCG PO CAPS: 145.0000 ug | ORAL_CAPSULE | ORAL | 2 refills | Status: DC

## 2017-06-28 NOTE — Patient Instructions (Signed)
Please sch fasting labs and follow up in 3-4 weeks with me  Take care   Constipation, Adult Constipation is when a person has fewer bowel movements in a week than normal, has difficulty having a bowel movement, or has stools that are dry, hard, or larger than normal. Constipation may be caused by an underlying condition. It may become worse with age if a person takes certain medicines and does not take in enough fluids. Follow these instructions at home: Eating and drinking   Eat foods that have a lot of fiber, such as fresh fruits and vegetables, whole grains, and beans.  Limit foods that are high in fat, low in fiber, or overly processed, such as french fries, hamburgers, cookies, candies, and soda.  Drink enough fluid to keep your urine clear or pale yellow. General instructions  Exercise regularly or as told by your health care provider.  Go to the restroom when you have the urge to go. Do not hold it in.  Take over-the-counter and prescription medicines only as told by your health care provider. These include any fiber supplements.  Practice pelvic floor retraining exercises, such as deep breathing while relaxing the lower abdomen and pelvic floor relaxation during bowel movements.  Watch your condition for any changes.  Keep all follow-up visits as told by your health care provider. This is important. Contact a health care provider if:  You have pain that gets worse.  You have a fever.  You do not have a bowel movement after 4 days.  You vomit.  You are not hungry.  You lose weight.  You are bleeding from the anus.  You have thin, pencil-like stools. Get help right away if:  You have a fever and your symptoms suddenly get worse.  You leak stool or have blood in your stool.  Your abdomen is bloated.  You have severe pain in your abdomen.  You feel dizzy or you faint. This information is not intended to replace advice given to you by your health care  provider. Make sure you discuss any questions you have with your health care provider. Document Released: 11/29/2003 Document Revised: 09/20/2015 Document Reviewed: 08/21/2015 Elsevier Interactive Patient Education  2018 [place] is a term that is commonly used to refer to joint pain or joint disease. There are more than 100 types of arthritis. What are the causes? The most common cause of this condition is wear and tear of a joint. Other causes include:  Gout.  Inflammation of a joint.  An infection of a joint.  Sprains and other injuries near the joint.  A drug reaction or allergic reaction.  In some cases, the cause may not be known. What are the signs or symptoms? The main symptom of this condition is pain in the joint with movement. Other symptoms include:  Redness, swelling, or stiffness at a joint.  Warmth coming from the joint.  Fever.  Overall feeling of illness.  How is this diagnosed? This condition may be diagnosed with a physical exam and tests, including:  Blood tests.  Urine tests.  Imaging tests, such as MRI, X-rays, or a CT scan.  Sometimes, fluid is removed from a joint for testing. How is this treated? Treatment for this condition may involve:  Treatment of the cause, if it is known.  Rest.  Raising (elevating) the joint.  Applying cold or hot packs to the joint.  Medicines to improve symptoms and reduce inflammation.  Injections of a steroid  such as cortisone into the joint to help reduce pain and inflammation.  Depending on the cause of your arthritis, you may need to make lifestyle changes to reduce stress on your joint. These changes may include exercising more and losing weight. Follow these instructions at home: Medicines  Take over-the-counter and prescription medicines only as told by your health care provider.  Do not take aspirin to relieve pain if gout is suspected. Activity  Rest your joint if  told by your health care provider. Rest is important when your disease is active and your joint feels painful, swollen, or stiff.  Avoid activities that make the pain worse. It is important to balance activity with rest.  Exercise your joint regularly with range-of-motion exercises as told by your health care provider. Try doing low-impact exercise, such as: ? Swimming. ? Water aerobics. ? Biking. ? Walking. Joint Care   If your joint is swollen, keep it elevated if told by your health care provider.  If your joint feels stiff in the morning, try taking a warm shower.  If directed, apply heat to the joint. If you have diabetes, do not apply heat without permission from your health care provider. ? Put a towel between the joint and the hot pack or heating pad. ? Leave the heat on the area for 20-30 minutes.  If directed, apply ice to the joint: ? Put ice in a plastic bag. ? Place a towel between your skin and the bag. ? Leave the ice on for 20 minutes, 2-3 times per day.  Keep all follow-up visits as told by your health care provider. This is important. Contact a health care provider if:  The pain gets worse.  You have a fever. Get help right away if:  You develop severe joint pain, swelling, or redness.  Many joints become painful and swollen.  You develop severe back pain.  You develop severe weakness in your leg.  You cannot control your bladder or bowels. This information is not intended to replace advice given to you by your health care provider. Make sure you discuss any questions you have with your health care provider. Document Released: 04/09/2004 Document Revised: 08/08/2015 Document Reviewed: 05/28/2014 Elsevier Interactive Patient Education  [employer].

## 2017-06-28 NOTE — Progress Notes (Signed)
Pre visit review using our clinic review tool, if applicable. No additional management support is needed unless otherwise documented below in the visit note. 

## 2017-06-29 ENCOUNTER — Telehealth: Payer: Self-pay | Admitting: Internal Medicine

## 2017-06-29 NOTE — Telephone Encounter (Signed)
Copied from [place] [id number]. Topic: Quick Communication - Lab Results >> Jun 29, 2017  3:14 PM Babs Bertin, CMA wrote: Called patient to inform them of [phone] lab results. When patient returns call, triage nurse may disclose results.   Pt returning call about lab results nurse triage line was busy. CB# [phone]

## 2017-06-30 ENCOUNTER — Other Ambulatory Visit: Payer: Self-pay | Admitting: Internal Medicine

## 2017-06-30 DIAGNOSIS — M25562 Pain in left knee: Principal | ICD-10-CM

## 2017-06-30 DIAGNOSIS — G8929 Other chronic pain: Secondary | ICD-10-CM

## 2017-06-30 DIAGNOSIS — M542 Cervicalgia: Principal | ICD-10-CM

## 2017-06-30 NOTE — Telephone Encounter (Signed)
Patient notified of her results- Kristine Jacobson- her daughter may call back to review the results as well because she helps her mother.

## 2017-07-01 ENCOUNTER — Telehealth: Payer: Self-pay | Admitting: Internal Medicine

## 2017-07-01 DIAGNOSIS — Z9181 History of falling: Secondary | ICD-10-CM | POA: Diagnosis not present

## 2017-07-01 DIAGNOSIS — Z79891 Long term (current) use of opiate analgesic: Secondary | ICD-10-CM | POA: Diagnosis not present

## 2017-07-01 DIAGNOSIS — Z87440 Personal history of urinary (tract) infections: Secondary | ICD-10-CM | POA: Diagnosis not present

## 2017-07-01 DIAGNOSIS — M25552 Pain in left hip: Secondary | ICD-10-CM | POA: Diagnosis not present

## 2017-07-01 DIAGNOSIS — K581 Irritable bowel syndrome with constipation: Secondary | ICD-10-CM | POA: Diagnosis not present

## 2017-07-01 DIAGNOSIS — M4807 Spinal stenosis, lumbosacral region: Secondary | ICD-10-CM | POA: Diagnosis not present

## 2017-07-01 DIAGNOSIS — G8929 Other chronic pain: Secondary | ICD-10-CM | POA: Diagnosis not present

## 2017-07-01 DIAGNOSIS — M47812 Spondylosis without myelopathy or radiculopathy, cervical region: Secondary | ICD-10-CM | POA: Diagnosis not present

## 2017-07-01 DIAGNOSIS — M25562 Pain in left knee: Secondary | ICD-10-CM | POA: Diagnosis not present

## 2017-07-01 DIAGNOSIS — I10 Essential (primary) hypertension: Secondary | ICD-10-CM | POA: Diagnosis not present

## 2017-07-01 DIAGNOSIS — Z7982 Long term (current) use of aspirin: Secondary | ICD-10-CM | POA: Diagnosis not present

## 2017-07-01 NOTE — Telephone Encounter (Signed)
Copied from [place] [phone]. Topic: Quick Communication - See Telephone Encounter >> Jul 01, 2017  3:55 PM Aurelio Brash B wrote: CRM for notification. See Telephone encounter for: 07/01/17.  Adrain PT from [place] is asking for verbal  orders for home  pt 2 x a  week for 5 weeks   and  OT evaluation  visit    His contact number is [phone]

## 2017-07-01 NOTE — Telephone Encounter (Signed)
Ok   TMS 

## 2017-07-02 ENCOUNTER — Ambulatory Visit: Payer: Self-pay | Admitting: Nurse Practitioner

## 2017-07-05 ENCOUNTER — Encounter: Payer: Self-pay | Admitting: Internal Medicine

## 2017-07-05 DIAGNOSIS — N3941 Urge incontinence: Secondary | ICD-10-CM | POA: Diagnosis not present

## 2017-07-05 NOTE — Telephone Encounter (Signed)
Left message informing patient of verbal orders from Dr. Olivia Mackie.

## 2017-07-05 NOTE — Progress Notes (Signed)
Chief Complaint  Patient presents with  . New Patient (Initial Visit)   New pt with daughter [name] former PCP Dr. Clayborn Bigness 20+ years  1. She reports recent fall in home she lives alone c/o left hip, left knee pain and neck pain worse since fall. She feel on the left side but did not hit her head.  She takes Tylenol 1000 bid for pain when needed. She uses a cane but has a rollator which she does not use mostly per daughter. Pain is 8/10 today walking makes pains worse, no pain with sitting.  She has a h/o lower back injections.  She takes Tramadol 50 mg 1x per day prn  2. S/p left hip fracture s/p repair Dr. Marry Guan  3. Chronic pain neck with bulging discs and moderate to severe spinal stenosis  4. HTN controlled on norvasc 2.5 mg qd, coreg 3.125 mg bid, Hyzaar 100-12.5 mg qd  5. Chronic UTI Dr. Jacqlyn Larsen has her on Doxycyline 100 1qam   Review of Systems  Constitutional: Negative for weight loss.  HENT: Negative for hearing loss.   Eyes: Negative for blurred vision.  Respiratory: Negative for shortness of breath.   Cardiovascular: Negative for chest pain.  Gastrointestinal: Negative for abdominal pain.  Genitourinary:       +urinary incontinence    Musculoskeletal: Positive for back pain, falls, joint pain and neck pain.  Skin: Negative for rash.  Neurological: Negative for headaches.  Psychiatric/Behavioral: Negative for depression and memory loss.   Past Medical History:  Diagnosis Date  . Arthritis    shoulders, hands  . HBP (high blood pressure)   . Hearing aid worn    bilateral  . Macular degeneration, age related   . Osteoporosis   . Overactive bladder   . Restless leg   . Spinal stenosis   . Vertigo    no episodes for several years  . Wears dentures    partial upper and lower   Past Surgical History:  Procedure Laterality Date  . ABDOMINAL HYSTERECTOMY    . BLADDER SURGERY     bladder suspension  . BREAST EXCISIONAL BIOPSY Right 1970   neg  . BROW LIFT Bilateral  12/01/2016   Procedure: BLEPHAROPLASTY upper eyelid with excess skin;  Surgeon: Karle Starch, MD;  Location: [facility];  Service: Ophthalmology;  Laterality: Bilateral;  MAC  . Esophageal dilitation    . FRACTURE SURGERY    . INTRAMEDULLARY (IM) NAIL INTERTROCHANTERIC Left 02/27/2015   Procedure: INTRAMEDULLARY (IM) NAIL INTERTROCHANTRIC;  Surgeon: Dereck Leep, MD;  Location: [facility];  Service: Orthopedics;  Laterality: Left;  . PTOSIS REPAIR Bilateral 12/01/2016   Procedure: PTOSIS REPAIR  repair resect ex;  Surgeon: Karle Starch, MD;  Location: [facility];  Service: Ophthalmology;  Laterality: Bilateral;  . TONSILLECTOMY     Family History  Problem Relation Age of Onset  . Diabetes Mother    Social History   Socioeconomic History  . Marital status: Widowed    Spouse name: Not on file  . Number of children: Not on file  . Years of education: Not on file  . Highest education level: Not on file  Occupational History  . Not on file  Social Needs  . Financial resource strain: Not on file  . Food insecurity:    Worry: Not on file    Inability: Not on file  . Transportation needs:    Medical: Not on file    Non-medical: Not on  file  Tobacco Use  . Smoking status: Never Smoker  . Smokeless tobacco: Never Used  Substance and Sexual Activity  . Alcohol use: No  . Drug use: No  . Sexual activity: Not on file  Lifestyle  . Physical activity:    Days per week: Not on file    Minutes per session: Not on file  . Stress: Not on file  Relationships  . Social connections:    Talks on phone: Not on file    Gets together: Not on file    Attends religious service: Not on file    Active member of club or organization: Not on file    Attends meetings of clubs or organizations: Not on file    Relationship status: Not on file  . Intimate partner violence:    Fear of current or ex partner: Not on file    Emotionally abused: Not on file    Physically abused: Not  on file    Forced sexual activity: Not on file  Other Topics Concern  . Not on file  Social History Narrative   Lives alone.   Current Meds  Medication Sig  . acetaminophen (TYLENOL) 500 MG tablet Take 1,000 mg by mouth every 6 (six) hours as needed (pain).  Marland Kitchen amLODipine (NORVASC) 2.5 MG tablet Take 7.5 mg by mouth daily with lunch.   Marland Kitchen aspirin EC 81 MG tablet Take 81 mg by mouth at bedtime.  . bisacodyl (BISACODYL) 5 MG EC tablet Take 5 mg by mouth 2 (two) times a week. Take 1 tablet (5 mg) by mouth on Sunday and Wednesday nights (the night before Linzess dose)  . Calcium Carb-Cholecalciferol (CALCIUM 600 + D PO) Take 600 mg by mouth 2 (two) times daily.  . carvedilol (COREG) 3.125 MG tablet Take 3.125 mg by mouth 2 (two) times daily with a meal.  . doxycycline (VIBRAMYCIN) 100 MG capsule Take 100 mg by mouth daily with lunch. Continuous course for UTI prevention  . gabapentin (NEURONTIN) 100 MG capsule Take 100-300 mg by mouth 3 (three) times daily. Take 2 capsules (200 mg) by mouth every morning, 1 capsule (100 mg) with lunch and 3 capsules (300 mg) at bedtime  . linaclotide (LINZESS) 145 MCG CAPS capsule Take 1 capsule (145 mcg total) by mouth 2 (two) times a week. Take 1 capsule (145 mcg) by mouth on Mondays and Thursdays at 2pm  . loratadine (CLARITIN) 10 MG tablet Take 10 mg by mouth daily.  Marland Kitchen losartan-hydrochlorothiazide (HYZAAR) 100-12.5 MG per tablet Take 1 tablet by mouth daily.   . Multiple Vitamin (MULTIVITAMIN WITH MINERALS) TABS tablet Take 1 tablet by mouth daily. Centrum Silver  . Multiple Vitamins-Minerals (PRESERVISION AREDS 2) CAPS Take 1 capsule by mouth 2 (two) times daily.  Marland Kitchen Propylene Glycol (SYSTANE BALANCE OP) Place 1 drop into both eyes 3 (three) times daily as needed (dry eyes).  Marland Kitchen rOPINIRole (REQUIP) 0.5 MG tablet Take 0.5 mg by mouth 3 (three) times daily.   . traMADol (ULTRAM) 50 MG tablet Take 1 tablet (50 mg total) by mouth every 6 (six) hours as needed for  moderate pain. (Patient taking differently: Take 50 mg by mouth every 6 (six) hours as needed for moderate pain. )  . [DISCONTINUED] linaclotide (LINZESS) 145 MCG CAPS capsule Take 145 mcg by mouth 2 (two) times a week. Take 1 capsule (145 mcg) by mouth on Mondays and Thursdays at 2pm   Allergies  Allergen Reactions  . Codeine Rash   No  results found for this or any previous visit (from the past 2160 hour(s)). Objective  Body mass index is 24.34 kg/m. Wt Readings from Last 3 Encounters:  06/28/17 155 lb 6.4 oz (70.5 kg)  03/03/17 153 lb 9.6 oz (69.7 kg)  12/01/16 154 lb (69.9 kg)   Temp Readings from Last 3 Encounters:  06/28/17 98.2 F (36.8 C) (Oral)  12/01/16 (!) 97.1 F (36.2 C)  09/15/15 98.4 F (36.9 C) (Oral)   BP Readings from Last 3 Encounters:  06/28/17 (!) 118/56  03/03/17 (!) 130/58  12/01/16 (!) 174/76   Pulse Readings from Last 3 Encounters:  06/28/17 68  03/03/17 69  12/01/16 66    Physical Exam  Constitutional: She is oriented to person, place, and time. Vital signs are normal. She appears well-developed and well-nourished. She is cooperative.  HENT:  Head: Normocephalic and atraumatic.  Mouth/Throat: Oropharynx is clear and moist and mucous membranes are normal.  Eyes: Pupils are equal, round, and reactive to light. Conjunctivae are normal.  Cardiovascular: Normal rate, regular rhythm and normal heart sounds.  Pulmonary/Chest: Effort normal and breath sounds normal.  Musculoskeletal:       Left hip: She exhibits tenderness.       Left knee: Tenderness found. Medial joint line tenderness noted.       Cervical back: She exhibits tenderness.  Neurological: She is alert and oriented to person, place, and time. Gait normal.  Walking with cane today   Skin: Skin is warm, dry and intact.  Psychiatric: She has a normal mood and affect. Her speech is normal and behavior is normal. Judgment and thought content normal. Cognition and memory are normal.  Nursing  note and vitals reviewed.   Assessment   1. Abnormality of gait with falls 2.Left hip pain  3. Left knee pain  4. Neck pain, chronic  5. H/o HTN/HLD 6. HM    Plan  1. Refer H/H PT 2-3. Xray left hip, left knee, neck today  -referred to ortho Dr. Marry Guan and [facility] in [place]  Prn Tylenol and Tramadol  sch Gabapentin  5. Cont meds labs sch 08/02/17  6.  Had flu shot 2018  Tdap 2016 need to verify  pna 23 03/03/17, need to ask about prevnar  Had zostervax disc shingrix in future   Colonoscopy ? 5 years ago  S/p hysterectomy out of age window pap  Mammogram sch 07/14/2017 per Dr. Clayborn Bigness  DEXA 12/31/14 femoral neck +osteoporosis, total femoral density osteopenia, spine normal  Dermatology f/u Dr. Evorn Gong h/o [facility] face   [facility]acy [facility] [address] [facility]   Other specialists  Dr. Durel Salts  Dr. Kenton Kingfisher Dentist  [facility] in [place]  Former PCP Dr. Clayborn Bigness  -requested notes w/in 2 years vaccines, labs  "I spent 40 minutes face-to face with patient with greater than 50% of time spent counseling and/or in coordination of care, chart review, and disc with pt and daughter    Provider: Dr. Olivia Mackie McLean-Scocuzza-Internal Medicine

## 2017-07-06 ENCOUNTER — Ambulatory Visit: Payer: Self-pay | Admitting: Nurse Practitioner

## 2017-07-06 ENCOUNTER — Telehealth: Payer: Self-pay

## 2017-07-06 NOTE — Telephone Encounter (Signed)
Copied from [place] [id number]. Topic: Quick Communication - See Telephone Encounter >> Jul 06, 2017  9:49 AM Hewitt Shorts wrote: Colletta Maryland from [facility] is calling to get verbal orders for OT 2x week for 4 weeks  1x week for 1 week  Best number [phone]

## 2017-07-06 NOTE — Telephone Encounter (Signed)
Ok   Thanks [employer]

## 2017-07-06 NOTE — Telephone Encounter (Signed)
Ok to give

## 2017-07-07 ENCOUNTER — Telehealth: Payer: Self-pay | Admitting: Internal Medicine

## 2017-07-07 ENCOUNTER — Other Ambulatory Visit: Payer: Self-pay | Admitting: Internal Medicine

## 2017-07-07 NOTE — Telephone Encounter (Signed)
Copied from [place]. Topic: Quick Communication - See Telephone Encounter >> Jul 07, 2017 11:29 AM Kristine Jacobson wrote: CRM for notification. See Telephone encounter for: 07/07/17.  Patient states that she has a ulcer on her ankle. She said that physical therapy saw her yesterday & advised her to let Dr Aundra Dubin know. Please advise.

## 2017-07-07 NOTE — Telephone Encounter (Signed)
Please advise 

## 2017-07-07 NOTE — Telephone Encounter (Signed)
Please advise.  Patient needs appointment correct

## 2017-07-07 NOTE — Telephone Encounter (Signed)
Left message informing Colletta Maryland of verbal orders.

## 2017-07-08 ENCOUNTER — Other Ambulatory Visit: Payer: Self-pay | Admitting: Internal Medicine

## 2017-07-15 ENCOUNTER — Telehealth: Payer: Self-pay | Admitting: Internal Medicine

## 2017-07-15 DIAGNOSIS — H353221 Exudative age-related macular degeneration, left eye, with active choroidal neovascularization: Secondary | ICD-10-CM | POA: Diagnosis not present

## 2017-07-15 NOTE — Telephone Encounter (Signed)
Prior auth for linzess has been approved.

## 2017-07-16 DIAGNOSIS — B351 Tinea unguium: Secondary | ICD-10-CM | POA: Diagnosis not present

## 2017-07-16 DIAGNOSIS — L97511 Non-pressure chronic ulcer of other part of right foot limited to breakdown of skin: Secondary | ICD-10-CM | POA: Diagnosis not present

## 2017-07-16 DIAGNOSIS — M79674 Pain in right toe(s): Secondary | ICD-10-CM | POA: Diagnosis not present

## 2017-07-16 DIAGNOSIS — M79675 Pain in left toe(s): Secondary | ICD-10-CM | POA: Diagnosis not present

## 2017-07-17 ENCOUNTER — Other Ambulatory Visit: Payer: Self-pay | Admitting: Internal Medicine

## 2017-08-02 ENCOUNTER — Other Ambulatory Visit (INDEPENDENT_AMBULATORY_CARE_PROVIDER_SITE_OTHER): Payer: Medicare Other

## 2017-08-02 DIAGNOSIS — I10 Essential (primary) hypertension: Secondary | ICD-10-CM

## 2017-08-02 DIAGNOSIS — Z1329 Encounter for screening for other suspected endocrine disorder: Secondary | ICD-10-CM

## 2017-08-02 DIAGNOSIS — E559 Vitamin D deficiency, unspecified: Secondary | ICD-10-CM | POA: Diagnosis not present

## 2017-08-02 DIAGNOSIS — N3941 Urge incontinence: Secondary | ICD-10-CM | POA: Diagnosis not present

## 2017-08-02 LAB — COMPREHENSIVE METABOLIC PANEL
ALK PHOS: 46 U/L (ref 39–117)
ALT: 17 U/L (ref 0–35)
AST: 21 U/L (ref 0–37)
Albumin: 4.1 g/dL (ref 3.5–5.2)
BILIRUBIN TOTAL: 0.4 mg/dL (ref 0.2–1.2)
BUN: 21 mg/dL (ref 6–23)
CO2: 28 mEq/L (ref 19–32)
CREATININE: 0.77 mg/dL (ref 0.40–1.20)
Calcium: 9.5 mg/dL (ref 8.4–10.5)
Chloride: 104 mEq/L (ref 96–112)
GFR: 74.47 mL/min (ref >60.00)
GLUCOSE: 95 mg/dL (ref 70–99)
Potassium: 4.1 mEq/L (ref 3.5–5.1)
SODIUM: 141 meq/L (ref 135–145)
TOTAL PROTEIN: 7.2 g/dL (ref 6.0–8.3)

## 2017-08-02 LAB — CBC WITH DIFFERENTIAL/PLATELET
Basophils Absolute: 0.1 10*3/uL (ref 0.0–0.1)
Basophils Relative: 1.1 % (ref 0.0–3.0)
Eosinophils Absolute: 0.1 10*3/uL (ref 0.0–0.7)
Eosinophils Relative: 2.8 % (ref 0.0–5.0)
HCT: 39.2 % (ref 36.0–46.0)
Hemoglobin: 13.1 g/dL (ref 12.0–15.0)
LYMPHS ABS: 1.4 10*3/uL (ref 0.7–4.0)
Lymphocytes Relative: 28.3 % (ref 12.0–46.0)
MCHC: 33.4 g/dL (ref 30.0–36.0)
MCV: 90.1 fl (ref 78.0–100.0)
MONOS PCT: 8.9 % (ref 3.0–12.0)
Monocytes Absolute: 0.4 10*3/uL (ref 0.1–1.0)
NEUTROS PCT: 58.9 % (ref 43.0–77.0)
Neutro Abs: 2.9 10*3/uL (ref 1.4–7.7)
Platelets: 175 10*3/uL (ref 150.0–400.0)
RBC: 4.36 Mil/uL (ref 3.87–5.11)
RDW: 13.8 % (ref 11.5–15.5)
WBC: 5 10*3/uL (ref 4.0–10.5)

## 2017-08-02 LAB — VITAMIN D 25 HYDROXY (VIT D DEFICIENCY, FRACTURES): VITD: 33.28 ng/mL (ref 30.00–100.00)

## 2017-08-02 LAB — TSH: TSH: 2.64 u[IU]/mL (ref 0.35–4.50)

## 2017-08-02 LAB — LIPID PANEL
Cholesterol: 171 mg/dL (ref 0–200)
HDL: 47.7 mg/dL (ref >39.00)
LDL Cholesterol: 97 mg/dL (ref 0–99)
NONHDL: 123.48
Total CHOL/HDL Ratio: 4
Triglycerides: 134 mg/dL (ref 0.0–149.0)
VLDL: 26.8 mg/dL (ref 0.0–40.0)

## 2017-08-03 LAB — URINALYSIS, ROUTINE W REFLEX MICROSCOPIC
Bilirubin, UA: NEGATIVE
Glucose, UA: NEGATIVE
KETONES UA: NEGATIVE
Leukocytes, UA: NEGATIVE
NITRITE UA: NEGATIVE
Protein, UA: NEGATIVE
RBC, UA: NEGATIVE
Specific Gravity, UA: 1.02 (ref 1.005–1.030)
UUROB: 0.2 mg/dL (ref 0.2–1.0)
pH, UA: 7 (ref 5.0–7.5)

## 2017-08-04 ENCOUNTER — Telehealth: Payer: Self-pay | Admitting: Internal Medicine

## 2017-08-04 ENCOUNTER — Encounter: Payer: Self-pay | Admitting: Internal Medicine

## 2017-08-04 ENCOUNTER — Ambulatory Visit: Payer: Medicare Other | Admitting: Internal Medicine

## 2017-08-04 VITALS — BP 148/66 | HR 72 | Temp 98.1°F | Ht 67.0 in | Wt 154.0 lb

## 2017-08-04 DIAGNOSIS — I700 Atherosclerosis of aorta: Secondary | ICD-10-CM | POA: Diagnosis not present

## 2017-08-04 DIAGNOSIS — R269 Unspecified abnormalities of gait and mobility: Secondary | ICD-10-CM | POA: Insufficient documentation

## 2017-08-04 DIAGNOSIS — M545 Low back pain: Secondary | ICD-10-CM

## 2017-08-04 DIAGNOSIS — M5030 Other cervical disc degeneration, unspecified cervical region: Secondary | ICD-10-CM | POA: Diagnosis not present

## 2017-08-04 DIAGNOSIS — M11262 Other chondrocalcinosis, left knee: Secondary | ICD-10-CM | POA: Diagnosis not present

## 2017-08-04 DIAGNOSIS — I10 Essential (primary) hypertension: Secondary | ICD-10-CM | POA: Diagnosis not present

## 2017-08-04 DIAGNOSIS — L97929 Non-pressure chronic ulcer of unspecified part of left lower leg with unspecified severity: Secondary | ICD-10-CM | POA: Insufficient documentation

## 2017-08-04 DIAGNOSIS — M1990 Unspecified osteoarthritis, unspecified site: Secondary | ICD-10-CM | POA: Diagnosis not present

## 2017-08-04 DIAGNOSIS — M542 Cervicalgia: Secondary | ICD-10-CM | POA: Diagnosis not present

## 2017-08-04 DIAGNOSIS — L97921 Non-pressure chronic ulcer of unspecified part of left lower leg limited to breakdown of skin: Secondary | ICD-10-CM

## 2017-08-04 DIAGNOSIS — M4800 Spinal stenosis, site unspecified: Secondary | ICD-10-CM

## 2017-08-04 DIAGNOSIS — M9981 Other biomechanical lesions of cervical region: Secondary | ICD-10-CM

## 2017-08-04 DIAGNOSIS — M4802 Spinal stenosis, cervical region: Secondary | ICD-10-CM

## 2017-08-04 MED ORDER — LOSARTAN POTASSIUM-HCTZ 100-25 MG PO TABS: 1.0000 | ORAL_TABLET | Freq: Every day | ORAL | 3 refills | Status: DC

## 2017-08-04 MED ORDER — AMLODIPINE BESYLATE 5 MG PO TABS: 5.0000 mg | ORAL_TABLET | Freq: Every day | ORAL | Status: DC

## 2017-08-04 NOTE — Progress Notes (Signed)
No tdap in [employer].

## 2017-08-04 NOTE — Progress Notes (Addendum)
Chief Complaint  Patient presents with  . Follow-up   F/u with daughter  1. HTN slightly elevated today on per pt norvasc mg ? Dose this was historical Rx, coreg 3.125 mg bid, hyzaar 100-12.5. She has cut back norvasc dose to ~5 mg qd BP still elevated. She is not using salt on foot but using salt substitute  2. C/o chronic neck pain Xray 06/2017 abnormal she declines to have surgery but was getting lower back injections and would be interested in neck injections and also she wants home PT for pain and falls which was not approved via [facility] in the past  3. Review Xray knee + chondrocalcinosis pending appt with Dr. Marry Guan  4. She has sore on inner left ankle/lower leg she is using neosporin on and has on compression stockings today    Review of Systems  Constitutional: Negative for weight loss.  HENT: Negative for hearing loss.   Eyes: Negative for blurred vision.  Respiratory: Negative for shortness of breath.   Cardiovascular: Negative for chest pain.  Gastrointestinal: Negative for abdominal pain.  Musculoskeletal: Positive for back pain, joint pain and neck pain.  Skin: Negative for rash.       +ulcer left inner ankle   Neurological: Negative for headaches.  Psychiatric/Behavioral: Negative for depression.   Past Medical History:  Diagnosis Date  . Allergy    seasonal   . Arthritis    shoulders, hands  . Cancer ([place])    [facility] face-Dr. Evorn Gong   . Chronic neck pain    2/2 bulging discs   . HBP (high blood pressure)   . Hearing aid worn    bilateral  . Macular degeneration    dx'ed 2015 dry now left wet and right mild wet [facility] Dr. Michelene Heady  . Macular degeneration, age related   . Osteoporosis   . Overactive bladder   . Restless leg   . Spinal stenosis   . Urinary incontinence    s/p monthly PTNS with Dr. Jacqlyn Larsen, no help with overactive bladder with meds nor Botox   . UTI (urinary tract infection)   . Vertigo    no episodes for several years  . Wears dentures     partial upper and lower   Past Surgical History:  Procedure Laterality Date  . ABDOMINAL HYSTERECTOMY     1967  . APPENDECTOMY     1967  . BLADDER SURGERY     bladder suspension; 2005, 2011-Dr. Cope  . BREAST EXCISIONAL BIOPSY Right 1970   neg  . BROW LIFT Bilateral 12/01/2016   Procedure: BLEPHAROPLASTY upper eyelid with excess skin;  Surgeon: Karle Starch, MD;  Location: [facility];  Service: Ophthalmology;  Laterality: Bilateral;  MAC  . CARPAL TUNNEL RELEASE     right 2016   . Esophageal dilitation    . eyelid surgery     2018  . FRACTURE SURGERY    . HIP SURGERY     left 2016   . INTRAMEDULLARY (IM) NAIL INTERTROCHANTERIC Left 02/27/2015   Procedure: INTRAMEDULLARY (IM) NAIL INTERTROCHANTRIC;  Surgeon: Dereck Leep, MD;  Location: [facility];  Service: Orthopedics;  Laterality: Left;  . PTOSIS REPAIR Bilateral 12/01/2016   Procedure: PTOSIS REPAIR  repair resect ex;  Surgeon: Karle Starch, MD;  Location: [facility];  Service: Ophthalmology;  Laterality: Bilateral;  . TONSILLECTOMY     Family History  Problem Relation Age of Onset  . Diabetes Mother   . Arthritis Mother   .  Stroke Mother   . Heart disease Father   . Diabetes Sister   . Cancer Brother        lung 2/2 asbestos   . Diabetes Daughter   . Diabetes Brother   . Diabetes Brother    Social History   Socioeconomic History  . Marital status: Widowed    Spouse name: Not on file  . Number of children: Not on file  . Years of education: Not on file  . Highest education level: Not on file  Occupational History  . Not on file  Social Needs  . Financial resource strain: Not on file  . Food insecurity:    Worry: Not on file    Inability: Not on file  . Transportation needs:    Medical: Not on file    Non-medical: Not on file  Tobacco Use  . Smoking status: Never Smoker  . Smokeless tobacco: Never Used  Substance and Sexual Activity  . Alcohol use: No  . Drug use: No  . Sexual  activity: Not on file  Lifestyle  . Physical activity:    Days per week: Not on file    Minutes per session: Not on file  . Stress: Not on file  Relationships  . Social connections:    Talks on phone: Not on file    Gets together: Not on file    Attends religious service: Not on file    Active member of club or organization: Not on file    Attends meetings of clubs or organizations: Not on file    Relationship status: Not on file  . Intimate partner violence:    Fear of current or ex partner: Not on file    Emotionally abused: Not on file    Physically abused: Not on file    Forced sexual activity: Not on file  Other Topics Concern  . Not on file  Social History Narrative   Lives alone.   Daughter [name] involved in care. 1 other child    HS ed.    Retired Education officer, [employer]    No guns    Wears seat belts    Safe in relationship    Current Meds  Medication Sig  . acetaminophen (TYLENOL) 500 MG tablet Take 1,000 mg by mouth 2 (two) times daily as needed (pain).   Marland Kitchen amLODipine (NORVASC) 5 MG tablet Take 1 tablet (5 mg total) by mouth daily.  Marland Kitchen aspirin EC 81 MG tablet Take 81 mg by mouth at bedtime.  . bisacodyl (BISACODYL) 5 MG EC tablet Take 5 mg by mouth 2 (two) times a week. Take 1 tablet (5 mg) by mouth on Sunday and Wednesday nights (the night before Linzess dose)  . Calcium Carb-Cholecalciferol (CALCIUM 600 + D PO) Take 600 mg by mouth 2 (two) times daily.  . carvedilol (COREG) 3.125 MG tablet Take 3.125 mg by mouth 2 (two) times daily with a meal.  . doxycycline (VIBRAMYCIN) 100 MG capsule Take 100 mg by mouth daily with lunch. Continuous course for UTI prevention  . gabapentin (NEURONTIN) 100 MG capsule TAKE TWO CAPSULES BY MOUTH THREE TIMES DAILY  . linaclotide (LINZESS) 145 MCG CAPS capsule Take 1 capsule (145 mcg total) by mouth 2 (two) times a week. Take 1 capsule (145 mcg) by mouth on Mondays and Thursdays at 2pm  . loratadine (CLARITIN) 10 MG tablet Take 10 mg by  mouth daily.  . Multiple Vitamin (MULTIVITAMIN WITH MINERALS) TABS tablet Take 1 tablet by mouth  daily. Centrum Silver  . Multiple Vitamins-Minerals (PRESERVISION AREDS 2) CAPS Take 1 capsule by mouth 2 (two) times daily.  Marland Kitchen Propylene Glycol (SYSTANE BALANCE OP) Place 1 drop into both eyes 3 (three) times daily as needed (dry eyes).  Marland Kitchen rOPINIRole (REQUIP) 0.5 MG tablet TAKE 1 TABLET BY MOUTH IN THE MORNING, 1 TABLET BY MOUTH AT LUNCH, AND 1 TABLET BY MOUTH IN THE EVENING.  . traMADol (ULTRAM) 50 MG tablet Take 1 tablet (50 mg total) by mouth every 6 (six) hours as needed for moderate pain. (Patient taking differently: Take 50 mg by mouth daily as needed for moderate pain. )  . [DISCONTINUED] amLODipine (NORVASC) 2.5 MG tablet Take 7.5 mg by mouth daily with lunch.   . [DISCONTINUED] losartan-hydrochlorothiazide (HYZAAR) 100-12.5 MG tablet TAKE 1 TABLET BY MOUTH EVERY MORNING   Allergies  Allergen Reactions  . Codeine Rash   Recent Results (from the past 2160 hour(s))  Vitamin D (25 hydroxy)     Status: None   Collection Time: 08/02/17  9:57 AM  Result Value Ref Range   VITD 33.28 30.00 - 100.00 ng/mL  TSH     Status: None   Collection Time: 08/02/17  9:57 AM  Result Value Ref Range   TSH 2.64 0.35 - 4.50 uIU/mL  Urinalysis, Routine w reflex microscopic     Status: None   Collection Time: 08/02/17  9:57 AM  Result Value Ref Range   Specific Gravity, UA 1.020 1.005 - 1.030   pH, UA 7.0 5.0 - 7.5   Color, UA Yellow Yellow   Appearance Ur Clear Clear   Leukocytes, UA Negative Negative   Protein, UA Negative Negative/Trace   Glucose, UA Negative Negative   Ketones, UA Negative Negative   RBC, UA Negative Negative   Bilirubin, UA Negative Negative   Urobilinogen, Ur 0.2 0.2 - 1.0 mg/dL   Nitrite, UA Negative Negative   Microscopic Examination Comment     Comment: Microscopic not indicated and not performed.  Lipid panel     Status: None   Collection Time: 08/02/17  9:57 AM  Result  Value Ref Range   Cholesterol 171 0 - 200 mg/dL    Comment: ATP III Classification       Desirable:  < 200 mg/dL               Borderline High:  200 - 239 mg/dL          High:  > = 240 mg/dL   Triglycerides 134.0 0.0 - 149.0 mg/dL    Comment: Normal:  <150 mg/dLBorderline High:  150 - 199 mg/dL   HDL 47.70 >39.00 mg/dL   VLDL 26.8 0.0 - 40.0 mg/dL   LDL Cholesterol 97 0 - 99 mg/dL   Total CHOL/HDL Ratio 4     Comment:                Men          Women1/2 Average Risk     3.4          3.3Average Risk          5.0          4.42X Average Risk          9.6          7.13X Average Risk          15.0          11.0  NonHDL 123.48     Comment: NOTE:  Non-HDL goal should be 30 mg/dL higher than patient's LDL goal (i.e. LDL goal of < 70 mg/dL, would have non-HDL goal of < 100 mg/dL)  CBC with Differential/Platelet     Status: None   Collection Time: 08/02/17  9:57 AM  Result Value Ref Range   WBC 5.0 4.0 - 10.5 K/uL   RBC 4.36 3.87 - 5.11 Mil/uL   Hemoglobin 13.1 12.0 - 15.0 g/dL   HCT 39.2 36.0 - 46.0 %   MCV 90.1 78.0 - 100.0 fl   MCHC 33.4 30.0 - 36.0 g/dL   RDW 13.8 11.5 - 15.5 %   Platelets 175.0 150.0 - 400.0 K/uL   Neutrophils Relative % 58.9 43.0 - 77.0 %   Lymphocytes Relative 28.3 12.0 - 46.0 %   Monocytes Relative 8.9 3.0 - 12.0 %   Eosinophils Relative 2.8 0.0 - 5.0 %   Basophils Relative 1.1 0.0 - 3.0 %   Neutro Abs 2.9 1.4 - 7.7 K/uL   Lymphs Abs 1.4 0.7 - 4.0 K/uL   Monocytes Absolute 0.4 0.1 - 1.0 K/uL   Eosinophils Absolute 0.1 0.0 - 0.7 K/uL   Basophils Absolute 0.1 0.0 - 0.1 K/uL  Comprehensive metabolic panel     Status: None   Collection Time: 08/02/17  9:57 AM  Result Value Ref Range   Sodium 141 135 - 145 mEq/L   Potassium 4.1 3.5 - 5.1 mEq/L   Chloride 104 96 - 112 mEq/L   CO2 28 19 - 32 mEq/L   Glucose, Bld 95 70 - 99 mg/dL   BUN 21 6 - 23 mg/dL   Creatinine, Ser 0.77 0.40 - 1.20 mg/dL   Total Bilirubin 0.4 0.2 - 1.2 mg/dL   Alkaline  Phosphatase 46 39 - 117 U/L   AST 21 0 - 37 U/L   ALT 17 0 - 35 U/L   Total Protein 7.2 6.0 - 8.3 g/dL   Albumin 4.1 3.5 - 5.2 g/dL   Calcium 9.5 8.4 - 10.5 mg/dL   GFR 74.47 >60.00 mL/min   Objective  Body mass index is 24.12 kg/m. Wt Readings from Last 3 Encounters:  08/04/17 154 lb (69.9 kg)  06/28/17 155 lb 6.4 oz (70.5 kg)  03/03/17 153 lb 9.6 oz (69.7 kg)   Temp Readings from Last 3 Encounters:  08/04/17 98.1 F (36.7 C) (Oral)  06/28/17 98.2 F (36.8 C) (Oral)  12/01/16 (!) 97.1 F (36.2 C)   BP Readings from Last 3 Encounters:  08/04/17 (!) 148/66  06/28/17 (!) 118/56  03/03/17 (!) 130/58   Pulse Readings from Last 3 Encounters:  08/04/17 72  06/28/17 68  03/03/17 69    Physical Exam  Constitutional: She is oriented to person, place, and time. She appears well-developed and well-nourished. She is cooperative.  HENT:  Head: Normocephalic and atraumatic.  Mouth/Throat: Oropharynx is clear and moist and mucous membranes are normal.  Eyes: Pupils are equal, round, and reactive to light. Conjunctivae are normal.  Cardiovascular: Normal rate, regular rhythm and normal heart sounds.  Pulmonary/Chest: Effort normal and breath sounds normal.  Neurological: She is alert and oriented to person, place, and time. Gait abnormal.  Skin: Skin is warm, dry and intact.  Psychiatric: She has a normal mood and affect. Her speech is normal and behavior is normal. Judgment and thought content normal. Cognition and memory are normal.  Nursing note and vitals reviewed.   Assessment   1. HTN  2. Chronic neck pain with abnormal Xray 06/2017 DDD, foraminal stenosis  3. Knee pain +chondrocalcinosis  4. H/o falls  5. Ulceration to left inner lower leg/ankle could be related to PAD and compression stocking use  6. HM Plan   1. norvasc 5  Coreg 3.125 bid  Increase hyzaar to 100-25 from 100-12.5  Recheck at f/u  2. Refer to Dr. Brien Few for injection at [facility] in [place] pt does  not want to purse surgery   Will refer home PT will re order home services denied b/c of incorrect bill type/procedures will check on this again  3. Pending appt with Dr. Marry Guan  Refer home PT  4. Refer home PT [facility] prev denied with insurance  5. rec pt remove compression stockings could have PAD and compression stockings could impede circulation  Wound care  Recheck at f/u  6.  Had flu shot 2018  Tdap 2016 need to verify  pna 23 03/03/17, need to ask about prevnar  Had zostervax disc shingrix in future   Colonoscopy ? 5 years ago out of age window now  S/p hysterectomy out of age window pap  Mammogram sch 08/12/2017 per Dr. Clayborn Bigness  DEXA 12/31/14 femoral neck +osteoporosis, total femoral density osteopenia, spine normal  -consider repeat DEXA and prolia in the future  Dermatology f/u Dr. Evorn Gong h/o [facility] face   [facility] [address] [facility]   Other specialists  Dr. Durel Salts  Dr. Kenton Kingfisher Dentist  [facility] in [place], Dr. Brien Few Former PCP Dr. Clayborn Bigness  -requested notes w/in 2 years vaccines, labs previous visit   Saw Dr. Jacqlyn Larsen 08/12/17 rec PTNS tx for overactive bladder f/u in 6 months   Provider: Dr. Olivia Mackie McLean-Scocuzza-Internal Medicine

## 2017-08-04 NOTE — Telephone Encounter (Signed)
Please advise 

## 2017-08-04 NOTE — Telephone Encounter (Signed)
Copied from [place] [id number]. Topic: Quick Communication - See Telephone Encounter >> Aug 04, 2017  3:24 PM Vernona Rieger wrote: CRM for notification. See Telephone encounter for: 08/04/17.  Aldrin, physical therapist from [facility] would like to request verbal orders for twice a week for three more weeks for physical therapy. Please call back @ [phone]

## 2017-08-04 NOTE — Progress Notes (Signed)
Pre visit review using our clinic review tool, if applicable. No additional management support is needed unless otherwise documented below in the visit note. 

## 2017-08-04 NOTE — Telephone Encounter (Signed)
Ok to give

## 2017-08-04 NOTE — Telephone Encounter (Signed)
Ok to do   TMS 

## 2017-08-04 NOTE — Patient Instructions (Addendum)
Amlodipine/Norvasc 5 mg only 1x per day  Coreg/carvediolol 3.125 2x per day  Hyzaar 100/12.5 (losartan/hydrochlorthiazide) increase 100/25  cetaphil or cerave cream for cream    Results for Kristine Jacobson, Kristine Jacobson (MRN [id number]) as of 08/04/2017 12:20  Ref. Range 08/02/2017 09:57  Sodium Latest Ref Range: 135 - 145 mEq/L 141  Potassium Latest Ref Range: 3.5 - 5.1 mEq/L 4.1  Chloride Latest Ref Range: 96 - 112 mEq/L 104  CO2 Latest Ref Range: 19 - 32 mEq/L 28  Glucose Latest Ref Range: 70 - 99 mg/dL 95  BUN Latest Ref Range: 6 - 23 mg/dL 21  Creatinine Latest Ref Range: 0.40 - 1.20 mg/dL 0.77  Calcium Latest Ref Range: 8.4 - 10.5 mg/dL 9.5  Alkaline Phosphatase Latest Ref Range: 39 - 117 U/L 46  Albumin Latest Ref Range: 3.5 - 5.2 g/dL 4.1  AST Latest Ref Range: 0 - 37 U/L 21  ALT Latest Ref Range: 0 - 35 U/L 17  Total Protein Latest Ref Range: 6.0 - 8.3 g/dL 7.2  Total Bilirubin Latest Ref Range: 0.2 - 1.2 mg/dL 0.4  GFR Latest Ref Range: >60.00 mL/min 74.47  Total CHOL/HDL Ratio Unknown 4  Cholesterol Latest Ref Range: 0 - 200 mg/dL 171  HDL Cholesterol Latest Ref Range: >39.00 mg/dL 47.70  LDL (calc) Latest Ref Range: 0 - 99 mg/dL 97  NonHDL Unknown 123.48  Triglycerides Latest Ref Range: 0.0 - 149.0 mg/dL 134.0  VLDL Latest Ref Range: 0.0 - 40.0 mg/dL 26.8  VITD Latest Ref Range: 30.00 - 100.00 ng/mL 33.28  WBC Latest Ref Range: 4.0 - 10.5 K/uL 5.0  RBC Latest Ref Range: 3.87 - 5.11 Mil/uL 4.36  Hemoglobin Latest Ref Range: 12.0 - 15.0 g/dL 13.1  HCT Latest Ref Range: 36.0 - 46.0 % 39.2  MCV Latest Ref Range: 78.0 - 100.0 fl 90.1  MCHC Latest Ref Range: 30.0 - 36.0 g/dL 33.4  RDW Latest Ref Range: 11.5 - 15.5 % 13.8  Platelets Latest Ref Range: 150.0 - 400.0 K/uL 175.0  Neutrophils Latest Ref Range: 43.0 - 77.0 % 58.9  Lymphocytes Latest Ref Range: 12.0 - 46.0 % 28.3  Monocytes Relative Latest Ref Range: 3.0 - 12.0 % 8.9  Eosinophil Latest Ref Range: 0.0 - 5.0 % 2.8  Basophil  Latest Ref Range: 0.0 - 3.0 % 1.1  NEUT# Latest Ref Range: 1.4 - 7.7 K/uL 2.9  Lymphocyte # Latest Ref Range: 0.7 - 4.0 K/uL 1.4  Monocyte # Latest Ref Range: 0.1 - 1.0 K/uL 0.4  Eosinophils Absolute Latest Ref Range: 0.0 - 0.7 K/uL 0.1  Basophils Absolute Latest Ref Range: 0.0 - 0.1 K/uL 0.1  TSH Latest Ref Range: 0.35 - 4.50 uIU/mL 2.64  URINALYSIS, ROUTINE W REFLEX MICROSCOPIC Unknown Rpt  Appearance Ur Latest Ref Range: Clear  Clear  Bilirubin, UA Latest Ref Range: Negative  Negative  Color, UA Latest Ref Range: Yellow  Yellow  Glucose Latest Ref Range: Negative  Negative  Ketones, UA Latest Ref Range: Negative  Negative  Leukocytes, UA Latest Ref Range: Negative  Negative  Nitrite, UA Latest Ref Range: Negative  Negative  pH, UA Latest Ref Range: 5.0 - 7.5  7.0  Protein, UA Latest Ref Range: Negative/Trace  Negative  Specific Gravity, UA Latest Ref Range: 1.005 - 1.030  1.020  Microscopic Examination Unknown Comment  RBC, UA Latest Ref Range: Negative  Negative  Urobilinogen, Ur Latest Ref Range: 0.2 - 1.0 mg/dL 0.2    Hypertension Hypertension, commonly called high blood pressure, is  when the force of blood pumping through the arteries is too strong. The arteries are the blood vessels that carry blood from the heart throughout the body. Hypertension forces the heart to work harder to pump blood and may cause arteries to become narrow or stiff. Having untreated or uncontrolled hypertension can cause heart attacks, strokes, kidney disease, and other problems. A blood pressure reading consists of a higher number over a lower number. Ideally, your blood pressure should be below 120/80. The first ("top") number is called the systolic pressure. It is a measure of the pressure in your arteries as your heart beats. The second ("bottom") number is called the diastolic pressure. It is a measure of the pressure in your arteries as the heart relaxes. What are the causes? The cause of this  condition is not known. What increases the risk? Some risk factors for high blood pressure are under your control. Others are not. Factors you can change  Smoking.  Having type 2 diabetes mellitus, high cholesterol, or both.  Not getting enough exercise or physical activity.  Being overweight.  Having too much fat, sugar, calories, or salt (sodium) in your diet.  Drinking too much alcohol. Factors that are difficult or impossible to change  Having chronic kidney disease.  Having a family history of high blood pressure.  Age. Risk increases with age.  Race. You may be at higher risk if you are African-American.  Gender. Men are at higher risk than women before age 57. After age 22, women are at higher risk than men.  Having obstructive sleep apnea.  Stress. What are the signs or symptoms? Extremely high blood pressure (hypertensive crisis) may cause:  Headache.  Anxiety.  Shortness of breath.  Nosebleed.  Nausea and vomiting.  Severe chest pain.  Jerky movements you cannot control (seizures).  How is this diagnosed? This condition is diagnosed by measuring your blood pressure while you are seated, with your arm resting on a surface. The cuff of the blood pressure monitor will be placed directly against the skin of your upper arm at the level of your heart. It should be measured at least twice using the same arm. Certain conditions can cause a difference in blood pressure between your right and left arms. Certain factors can cause blood pressure readings to be lower or higher than normal (elevated) for a short period of time:  When your blood pressure is higher when you are in a health care provider's office than when you are at home, this is called white coat hypertension. Most people with this condition do not need medicines.  When your blood pressure is higher at home than when you are in a health care provider's office, this is called masked hypertension. Most  people with this condition may need medicines to control blood pressure.  If you have a high blood pressure reading during one visit or you have normal blood pressure with other risk factors:  You may be asked to return on a different day to have your blood pressure checked again.  You may be asked to monitor your blood pressure at home for 1 week or longer.  If you are diagnosed with hypertension, you may have other blood or imaging tests to help your health care provider understand your overall risk for other conditions. How is this treated? This condition is treated by making healthy lifestyle changes, such as eating healthy foods, exercising more, and reducing your alcohol intake. Your health care provider may prescribe medicine if  lifestyle changes are not enough to get your blood pressure under control, and if:  Your systolic blood pressure is above 130.  Your diastolic blood pressure is above 80.  Your personal target blood pressure may vary depending on your medical conditions, your age, and other factors. Follow these instructions at home: Eating and drinking  Eat a diet that is high in fiber and potassium, and low in sodium, added sugar, and fat. An example eating plan is called the DASH (Dietary Approaches to Stop Hypertension) diet. To eat this way: ? Eat plenty of fresh fruits and vegetables. Try to fill half of your plate at each meal with fruits and vegetables. ? Eat whole grains, such as whole wheat pasta, brown rice, or whole grain bread. Fill about one quarter of your plate with whole grains. ? Eat or drink low-fat dairy products, such as skim milk or low-fat yogurt. ? Avoid fatty cuts of meat, processed or cured meats, and poultry with skin. Fill about one quarter of your plate with lean proteins, such as fish, chicken without skin, beans, eggs, and tofu. ? Avoid premade and processed foods. These tend to be higher in sodium, added sugar, and fat.  Reduce your daily  sodium intake. Most people with hypertension should eat less than 1,500 mg of sodium a day.  Limit alcohol intake to no more than 1 drink a day for nonpregnant women and 2 drinks a day for men. One drink equals 12 oz of beer, 5 oz of wine, or 1 oz of hard liquor. Lifestyle  Work with your health care provider to maintain a healthy body weight or to lose weight. Ask what an ideal weight is for you.  Get at least 30 minutes of exercise that causes your heart to beat faster (aerobic exercise) most days of the week. Activities may include walking, swimming, or biking.  Include exercise to strengthen your muscles (resistance exercise), such as pilates or lifting weights, as part of your weekly exercise routine. Try to do these types of exercises for 30 minutes at least 3 days a week.  Do not use any products that contain nicotine or tobacco, such as cigarettes and e-cigarettes. If you need help quitting, ask your health care provider.  Monitor your blood pressure at home as told by your health care provider.  Keep all follow-up visits as told by your health care provider. This is important. Medicines  Take over-the-counter and prescription medicines only as told by your health care provider. Follow directions carefully. Blood pressure medicines must be taken as prescribed.  Do not skip doses of blood pressure medicine. Doing this puts you at risk for problems and can make the medicine less effective.  Ask your health care provider about side effects or reactions to medicines that you should watch for. Contact a health care provider if:  You think you are having a reaction to a medicine you are taking.  You have headaches that keep coming back (recurring).  You feel dizzy.  You have swelling in your ankles.  You have trouble with your vision. Get help right away if:  You develop a severe headache or confusion.  You have unusual weakness or numbness.  You feel faint.  You have  severe pain in your chest or abdomen.  You vomit repeatedly.  You have trouble breathing. Summary  Hypertension is when the force of blood pumping through your arteries is too strong. If this condition is not controlled, it may put you at risk  for serious complications.  Your personal target blood pressure may vary depending on your medical conditions, your age, and other factors. For most people, a normal blood pressure is less than 120/80.  Hypertension is treated with lifestyle changes, medicines, or a combination of both. Lifestyle changes include weight loss, eating a healthy, low-sodium diet, exercising more, and limiting alcohol. This information is not intended to replace advice given to you by your health care provider. Make sure you discuss any questions you have with your health care provider. Document Released: 03/02/2005 Document Revised: 01/29/2016 Document Reviewed: 01/29/2016 Elsevier Interactive Patient Education  2018 [place] Eating Plan DASH stands for "Dietary Approaches to Stop Hypertension." The DASH eating plan is a healthy eating plan that has been shown to reduce high blood pressure (hypertension). It may also reduce your risk for type 2 diabetes, heart disease, and stroke. The DASH eating plan may also help with weight loss. What are tips for following this plan? General guidelines  Avoid eating more than 2,300 mg (milligrams) of salt (sodium) a day. If you have hypertension, you may need to reduce your sodium intake to 1,500 mg a day.  Limit alcohol intake to no more than 1 drink a day for nonpregnant women and 2 drinks a day for men. One drink equals 12 oz of beer, 5 oz of wine, or 1 oz of hard liquor.  Work with your health care provider to maintain a healthy body weight or to lose weight. Ask what an ideal weight is for you.  Get at least 30 minutes of exercise that causes your heart to beat faster (aerobic exercise) most days of the week.  Activities may include walking, swimming, or biking.  Work with your health care provider or diet and nutrition specialist (dietitian) to adjust your eating plan to your individual calorie needs. Reading food labels  Check food labels for the amount of sodium per serving. Choose foods with less than 5 percent of the Daily Value of sodium. Generally, foods with less than 300 mg of sodium per serving fit into this eating plan.  To find whole grains, look for the word "whole" as the first word in the ingredient list. Shopping  Buy products labeled as "low-sodium" or "no salt added."  Buy fresh foods. Avoid canned foods and premade or frozen meals. Cooking  Avoid adding salt when cooking. Use salt-free seasonings or herbs instead of table salt or sea salt. Check with your health care provider or pharmacist before using salt substitutes.  Do not fry foods. Cook foods using healthy methods such as baking, boiling, grilling, and broiling instead.  Cook with heart-healthy oils, such as olive, canola, soybean, or sunflower oil. Meal planning   Eat a balanced diet that includes: ? 5 or more servings of fruits and vegetables each day. At each meal, try to fill half of your plate with fruits and vegetables. ? Up to 6-8 servings of whole grains each day. ? Less than 6 oz of lean meat, poultry, or fish each day. A 3-oz serving of meat is about the same size as a deck of cards. One egg equals 1 oz. ? 2 servings of low-fat dairy each day. ? A serving of nuts, seeds, or beans 5 times each week. ? Heart-healthy fats. Healthy fats called Omega-3 fatty acids are found in foods such as flaxseeds and coldwater fish, like sardines, salmon, and mackerel.  Limit how much you eat of the following: ? Canned or prepackaged foods. ?  Food that is high in trans fat, such as fried foods. ? Food that is high in saturated fat, such as fatty meat. ? Sweets, desserts, sugary drinks, and other foods with added  sugar. ? Full-fat dairy products.  Do not salt foods before eating.  Try to eat at least 2 vegetarian meals each week.  Eat more home-cooked food and less restaurant, buffet, and fast food.  When eating at a restaurant, ask that your food be prepared with less salt or no salt, if possible. What foods are recommended? The items listed may not be a complete list. Talk with your dietitian about what dietary choices are best for you. Grains Whole-grain or whole-wheat bread. Whole-grain or whole-wheat pasta. Brown rice. [place]. Bulgur. Whole-grain and low-sodium cereals. Pita bread. Low-fat, low-sodium crackers. Whole-wheat flour tortillas. Vegetables Fresh or frozen vegetables (raw, steamed, roasted, or grilled). Low-sodium or reduced-sodium tomato and vegetable juice. Low-sodium or reduced-sodium tomato sauce and tomato paste. Low-sodium or reduced-sodium canned vegetables. Fruits All fresh, dried, or frozen fruit. Canned fruit in natural juice (without added sugar). Meat and other protein foods Skinless chicken or [place]. Ground chicken or [place]. Pork with fat trimmed off. Fish and seafood. Egg whites. Dried beans, peas, or lentils. Unsalted nuts, nut butters, and seeds. Unsalted canned beans. Lean cuts of beef with fat trimmed off. Low-sodium, lean deli meat. Dairy Low-fat (1%) or fat-free (skim) milk. Fat-free, low-fat, or reduced-fat cheeses. Nonfat, low-sodium ricotta or cottage cheese. Low-fat or nonfat yogurt. Low-fat, low-sodium cheese. Fats and oils Soft margarine without trans fats. Vegetable oil. Low-fat, reduced-fat, or light mayonnaise and salad dressings (reduced-sodium). Canola, safflower, olive, soybean, and sunflower oils. Avocado. Seasoning and other foods Herbs. Spices. Seasoning mixes without salt. Unsalted popcorn and pretzels. Fat-free sweets. What foods are not recommended? The items listed may not be a complete list. Talk with your dietitian about what  dietary choices are best for you. Grains Baked goods made with fat, such as croissants, muffins, or some breads. Dry pasta or rice meal packs. Vegetables Creamed or fried vegetables. Vegetables in a cheese sauce. Regular canned vegetables (not low-sodium or reduced-sodium). Regular canned tomato sauce and paste (not low-sodium or reduced-sodium). Regular tomato and vegetable juice (not low-sodium or reduced-sodium). Angie Fava. Olives. Fruits Canned fruit in a light or heavy syrup. Fried fruit. Fruit in cream or butter sauce. Meat and other protein foods Fatty cuts of meat. Ribs. Fried meat. Berniece Salines. Sausage. Bologna and other processed lunch meats. Salami. Fatback. Hotdogs. Bratwurst. Salted nuts and seeds. Canned beans with added salt. Canned or smoked fish. Whole eggs or egg yolks. Chicken or [place] with skin. Dairy Whole or 2% milk, cream, and half-and-half. Whole or full-fat cream cheese. Whole-fat or sweetened yogurt. Full-fat cheese. Nondairy creamers. Whipped toppings. Processed cheese and cheese spreads. Fats and oils Butter. Stick margarine. Lard. Shortening. Ghee. Bacon fat. Tropical oils, such as coconut, palm kernel, or palm oil. Seasoning and other foods Salted popcorn and pretzels. Onion salt, garlic salt, seasoned salt, table salt, and sea salt. Worcestershire sauce. Tartar sauce. Barbecue sauce. Teriyaki sauce. Soy sauce, including reduced-sodium. Steak sauce. Canned and packaged gravies. Fish sauce. Oyster sauce. Cocktail sauce. Horseradish that you find on the shelf. Ketchup. Mustard. Meat flavorings and tenderizers. Bouillon cubes. Hot sauce and Tabasco sauce. Premade or packaged marinades. Premade or packaged taco seasonings. Relishes. Regular salad dressings. Where to find more information:  National Heart, Lung, and [place]: https://wilson-eaton.com/  American Heart Association: www.heart.org Summary  The DASH eating plan is  a healthy eating plan that has been shown to reduce  high blood pressure (hypertension). It may also reduce your risk for type 2 diabetes, heart disease, and stroke.  With the DASH eating plan, you should limit salt (sodium) intake to 2,300 mg a day. If you have hypertension, you may need to reduce your sodium intake to 1,500 mg a day.  When on the DASH eating plan, aim to eat more fresh fruits and vegetables, whole grains, lean proteins, low-fat dairy, and heart-healthy fats.  Work with your health care provider or diet and nutrition specialist (dietitian) to adjust your eating plan to your individual calorie needs. This information is not intended to replace advice given to you by your health care provider. Make sure you discuss any questions you have with your health care provider. Document Released: 02/19/2011 Document Revised: 02/24/2016 Document Reviewed: 02/24/2016 Elsevier Interactive Patient Education  [employer].

## 2017-08-12 ENCOUNTER — Ambulatory Visit
Admission: RE | Admit: 2017-08-12 | Discharge: 2017-08-12 | Disposition: A | Discharge status: Home / Self Care | Payer: Medicare Other | Source: Ambulatory Visit | Attending: Internal Medicine | Admitting: Internal Medicine

## 2017-08-12 DIAGNOSIS — R3129 Other microscopic hematuria: Secondary | ICD-10-CM | POA: Diagnosis not present

## 2017-08-12 DIAGNOSIS — Z1231 Encounter for screening mammogram for malignant neoplasm of breast: Secondary | ICD-10-CM | POA: Insufficient documentation

## 2017-08-12 DIAGNOSIS — R350 Frequency of micturition: Secondary | ICD-10-CM | POA: Diagnosis not present

## 2017-08-12 DIAGNOSIS — N3020 Other chronic cystitis without hematuria: Secondary | ICD-10-CM | POA: Diagnosis not present

## 2017-08-12 DIAGNOSIS — N3941 Urge incontinence: Secondary | ICD-10-CM | POA: Diagnosis not present

## 2017-08-12 DIAGNOSIS — Z1239 Encounter for other screening for malignant neoplasm of breast: Secondary | ICD-10-CM

## 2017-08-13 ENCOUNTER — Other Ambulatory Visit: Payer: Self-pay | Admitting: Internal Medicine

## 2017-08-13 DIAGNOSIS — N6489 Other specified disorders of breast: Secondary | ICD-10-CM

## 2017-08-13 DIAGNOSIS — R928 Other abnormal and inconclusive findings on diagnostic imaging of breast: Secondary | ICD-10-CM

## 2017-08-17 ENCOUNTER — Encounter: Payer: Self-pay | Admitting: Internal Medicine

## 2017-08-17 ENCOUNTER — Telehealth: Payer: Self-pay | Admitting: Internal Medicine

## 2017-08-17 ENCOUNTER — Ambulatory Visit: Payer: Medicare Other | Admitting: Internal Medicine

## 2017-08-17 VITALS — BP 150/70 | HR 77 | Temp 98.0°F | Ht 67.0 in | Wt 154.0 lb

## 2017-08-17 DIAGNOSIS — I10 Essential (primary) hypertension: Secondary | ICD-10-CM | POA: Diagnosis not present

## 2017-08-17 DIAGNOSIS — I739 Peripheral vascular disease, unspecified: Secondary | ICD-10-CM | POA: Diagnosis not present

## 2017-08-17 DIAGNOSIS — R600 Localized edema: Secondary | ICD-10-CM

## 2017-08-17 DIAGNOSIS — L97929 Non-pressure chronic ulcer of unspecified part of left lower leg with unspecified severity: Secondary | ICD-10-CM | POA: Diagnosis not present

## 2017-08-17 DIAGNOSIS — R928 Other abnormal and inconclusive findings on diagnostic imaging of breast: Secondary | ICD-10-CM

## 2017-08-17 MED ORDER — FUROSEMIDE 20 MG PO TABS: 20.0000 mg | ORAL_TABLET | ORAL | 0 refills | Status: DC | PRN

## 2017-08-17 MED ORDER — MUPIROCIN 2 % EX OINT: 1.0000 "application " | TOPICAL_OINTMENT | Freq: Two times a day (BID) | CUTANEOUS | 0 refills | Status: DC

## 2017-08-17 NOTE — Progress Notes (Signed)
Pre visit review using our clinic review tool, if applicable. No additional management support is needed unless otherwise documented below in the visit note. 

## 2017-08-17 NOTE — Telephone Encounter (Signed)
See message about mammogram

## 2017-08-17 NOTE — Progress Notes (Signed)
Chief Complaint  Patient presents with  . Follow-up    ulcer   F/u with daughter [name]   1. HTN on norvasc 5 mg qd, hyzaar 100-25 mg qd, coreg 3.125 mg bid  2. C/o left leg medial ulcer getting worse since 08/04/17 visit. She tried not wearing compression stockings then her legs became swollen so she started wearing them again. No medications tried for ulcer she does have gauze and paper tape bandage on it today    Review of Systems  Constitutional: Negative for weight loss.  HENT: Negative for hearing loss.   Eyes: Negative for blurred vision.  Respiratory: Negative for shortness of breath.   Cardiovascular: Positive for leg swelling. Negative for chest pain.  Skin:       +left leg ulcer   Psychiatric/Behavioral: Negative for depression.   Past Medical History:  Diagnosis Date  . Allergy    seasonal   . Arthritis    shoulders, hands  . Cancer ([place])    [facility] face-Dr. Evorn Gong   . Chronic neck pain    2/2 bulging discs   . HBP (high blood pressure)   . Hearing aid worn    bilateral  . Macular degeneration    dx'ed 2015 dry now left wet and right mild wet [facility] Dr. Michelene Heady  . Macular degeneration, age related   . Osteoporosis   . Overactive bladder   . Restless leg   . Spinal stenosis   . Urinary incontinence    s/p monthly PTNS with Dr. Jacqlyn Larsen, no help with overactive bladder with meds nor Botox   . UTI (urinary tract infection)   . Vertigo    no episodes for several years  . Wears dentures    partial upper and lower   Past Surgical History:  Procedure Laterality Date  . ABDOMINAL HYSTERECTOMY     1967  . APPENDECTOMY     1967  . BLADDER SURGERY     bladder suspension; 2005, 2011-Dr. Cope  . BREAST EXCISIONAL BIOPSY Right 1970   neg  . BROW LIFT Bilateral 12/01/2016   Procedure: BLEPHAROPLASTY upper eyelid with excess skin;  Surgeon: Karle Starch, MD;  Location: [facility];  Service: Ophthalmology;  Laterality: Bilateral;  MAC  . CARPAL TUNNEL  RELEASE     right 2016   . Esophageal dilitation    . eyelid surgery     2018  . FRACTURE SURGERY    . HIP SURGERY     left 2016   . INTRAMEDULLARY (IM) NAIL INTERTROCHANTERIC Left 02/27/2015   Procedure: INTRAMEDULLARY (IM) NAIL INTERTROCHANTRIC;  Surgeon: Dereck Leep, MD;  Location: [facility];  Service: Orthopedics;  Laterality: Left;  . PTOSIS REPAIR Bilateral 12/01/2016   Procedure: PTOSIS REPAIR  repair resect ex;  Surgeon: Karle Starch, MD;  Location: [facility];  Service: Ophthalmology;  Laterality: Bilateral;  . TONSILLECTOMY     Family History  Problem Relation Age of Onset  . Diabetes Mother   . Arthritis Mother   . Stroke Mother   . Heart disease Father   . Diabetes Sister   . Cancer Brother        lung 2/2 asbestos   . Diabetes Daughter   . Diabetes Brother   . Diabetes Brother   . Breast cancer Neg Hx    Social History   Socioeconomic History  . Marital status: Widowed    Spouse name: Not on file  . Number of children: Not on  file  . Years of education: Not on file  . Highest education level: Not on file  Occupational History  . Not on file  Social Needs  . Financial resource strain: Not on file  . Food insecurity:    Worry: Not on file    Inability: Not on file  . Transportation needs:    Medical: Not on file    Non-medical: Not on file  Tobacco Use  . Smoking status: Never Smoker  . Smokeless tobacco: Never Used  Substance and Sexual Activity  . Alcohol use: No  . Drug use: No  . Sexual activity: Not on file  Lifestyle  . Physical activity:    Days per week: Not on file    Minutes per session: Not on file  . Stress: Not on file  Relationships  . Social connections:    Talks on phone: Not on file    Gets together: Not on file    Attends religious service: Not on file    Active member of club or organization: Not on file    Attends meetings of clubs or organizations: Not on file    Relationship status: Not on file  . Intimate  partner violence:    Fear of current or ex partner: Not on file    Emotionally abused: Not on file    Physically abused: Not on file    Forced sexual activity: Not on file  Other Topics Concern  . Not on file  Social History Narrative   Lives alone.   Daughter [name] involved in care. 1 other child    HS ed.    Retired Education officer, [employer]    No guns    Wears seat belts    Safe in relationship    Current Meds  Medication Sig  . acetaminophen (TYLENOL) 500 MG tablet Take 1,000 mg by mouth 2 (two) times daily as needed (pain).   Marland Kitchen amLODipine (NORVASC) 5 MG tablet Take 1 tablet (5 mg total) by mouth daily.  Marland Kitchen aspirin EC 81 MG tablet Take 81 mg by mouth at bedtime.  . bisacodyl (BISACODYL) 5 MG EC tablet Take 5 mg by mouth 2 (two) times a week. Take 1 tablet (5 mg) by mouth on Sunday and Wednesday nights (the night before Linzess dose)  . Calcium Carb-Cholecalciferol (CALCIUM 600 + D PO) Take 600 mg by mouth 2 (two) times daily.  . carvedilol (COREG) 3.125 MG tablet Take 3.125 mg by mouth 2 (two) times daily with a meal.  . doxycycline (VIBRAMYCIN) 100 MG capsule Take 100 mg by mouth daily with lunch. Continuous course for UTI prevention  . gabapentin (NEURONTIN) 100 MG capsule TAKE TWO CAPSULES BY MOUTH THREE TIMES DAILY  . linaclotide (LINZESS) 145 MCG CAPS capsule Take 1 capsule (145 mcg total) by mouth 2 (two) times a week. Take 1 capsule (145 mcg) by mouth on Mondays and Thursdays at 2pm  . loratadine (CLARITIN) 10 MG tablet Take 10 mg by mouth daily.  Marland Kitchen losartan-hydrochlorothiazide (HYZAAR) 100-25 MG tablet Take 1 tablet by mouth daily.  . Multiple Vitamin (MULTIVITAMIN WITH MINERALS) TABS tablet Take 1 tablet by mouth daily. Centrum Silver  . Multiple Vitamins-Minerals (PRESERVISION AREDS 2) CAPS Take 1 capsule by mouth 2 (two) times daily.  Marland Kitchen Propylene Glycol (SYSTANE BALANCE OP) Place 1 drop into both eyes 3 (three) times daily as needed (dry eyes).  Marland Kitchen rOPINIRole (REQUIP) 0.5 MG tablet  TAKE 1 TABLET BY MOUTH IN THE MORNING, 1 TABLET BY  MOUTH AT LUNCH, AND 1 TABLET BY MOUTH IN THE EVENING.  . traMADol (ULTRAM) 50 MG tablet Take 1 tablet (50 mg total) by mouth every 6 (six) hours as needed for moderate pain. (Patient taking differently: Take 50 mg by mouth daily as needed for moderate pain. )   Allergies  Allergen Reactions  . Codeine Rash   Recent Results (from the past 2160 hour(s))  Vitamin D (25 hydroxy)     Status: None   Collection Time: 08/02/17  9:57 AM  Result Value Ref Range   VITD 33.28 30.00 - 100.00 ng/mL  TSH     Status: None   Collection Time: 08/02/17  9:57 AM  Result Value Ref Range   TSH 2.64 0.35 - 4.50 uIU/mL  Urinalysis, Routine w reflex microscopic     Status: None   Collection Time: 08/02/17  9:57 AM  Result Value Ref Range   Specific Gravity, UA 1.020 1.005 - 1.030   pH, UA 7.0 5.0 - 7.5   Color, UA Yellow Yellow   Appearance Ur Clear Clear   Leukocytes, UA Negative Negative   Protein, UA Negative Negative/Trace   Glucose, UA Negative Negative   Ketones, UA Negative Negative   RBC, UA Negative Negative   Bilirubin, UA Negative Negative   Urobilinogen, Ur 0.2 0.2 - 1.0 mg/dL   Nitrite, UA Negative Negative   Microscopic Examination Comment     Comment: Microscopic not indicated and not performed.  Lipid panel     Status: None   Collection Time: 08/02/17  9:57 AM  Result Value Ref Range   Cholesterol 171 0 - 200 mg/dL    Comment: ATP III Classification       Desirable:  < 200 mg/dL               Borderline High:  200 - 239 mg/dL          High:  > = 240 mg/dL   Triglycerides 134.0 0.0 - 149.0 mg/dL    Comment: Normal:  <150 mg/dLBorderline High:  150 - 199 mg/dL   HDL 47.70 >39.00 mg/dL   VLDL 26.8 0.0 - 40.0 mg/dL   LDL Cholesterol 97 0 - 99 mg/dL   Total CHOL/HDL Ratio 4     Comment:                Men          Women1/2 Average Risk     3.4          3.3Average Risk          5.0          4.42X Average Risk          9.6          7.13X  Average Risk          15.0          11.0                       NonHDL 123.48     Comment: NOTE:  Non-HDL goal should be 30 mg/dL higher than patient's LDL goal (i.e. LDL goal of < 70 mg/dL, would have non-HDL goal of < 100 mg/dL)  CBC with Differential/Platelet     Status: None   Collection Time: 08/02/17  9:57 AM  Result Value Ref Range   WBC 5.0 4.0 - 10.5 K/uL   RBC 4.36 3.87 - 5.11 Mil/uL   Hemoglobin 13.1  12.0 - 15.0 g/dL   HCT 39.2 36.0 - 46.0 %   MCV 90.1 78.0 - 100.0 fl   MCHC 33.4 30.0 - 36.0 g/dL   RDW 13.8 11.5 - 15.5 %   Platelets 175.0 150.0 - 400.0 K/uL   Neutrophils Relative % 58.9 43.0 - 77.0 %   Lymphocytes Relative 28.3 12.0 - 46.0 %   Monocytes Relative 8.9 3.0 - 12.0 %   Eosinophils Relative 2.8 0.0 - 5.0 %   Basophils Relative 1.1 0.0 - 3.0 %   Neutro Abs 2.9 1.4 - 7.7 K/uL   Lymphs Abs 1.4 0.7 - 4.0 K/uL   Monocytes Absolute 0.4 0.1 - 1.0 K/uL   Eosinophils Absolute 0.1 0.0 - 0.7 K/uL   Basophils Absolute 0.1 0.0 - 0.1 K/uL  Comprehensive metabolic panel     Status: None   Collection Time: 08/02/17  9:57 AM  Result Value Ref Range   Sodium 141 135 - 145 mEq/L   Potassium 4.1 3.5 - 5.1 mEq/L   Chloride 104 96 - 112 mEq/L   CO2 28 19 - 32 mEq/L   Glucose, Bld 95 70 - 99 mg/dL   BUN 21 6 - 23 mg/dL   Creatinine, Ser 0.77 0.40 - 1.20 mg/dL   Total Bilirubin 0.4 0.2 - 1.2 mg/dL   Alkaline Phosphatase 46 39 - 117 U/L   AST 21 0 - 37 U/L   ALT 17 0 - 35 U/L   Total Protein 7.2 6.0 - 8.3 g/dL   Albumin 4.1 3.5 - 5.2 g/dL   Calcium 9.5 8.4 - 10.5 mg/dL   GFR 74.47 >60.00 mL/min   Objective  Body mass index is 24.12 kg/m. Wt Readings from Last 3 Encounters:  08/17/17 154 lb (69.9 kg)  08/04/17 154 lb (69.9 kg)  06/28/17 155 lb 6.4 oz (70.5 kg)   Temp Readings from Last 3 Encounters:  08/17/17 98 F (36.7 C) (Oral)  08/04/17 98.1 F (36.7 C) (Oral)  06/28/17 98.2 F (36.8 C) (Oral)   BP Readings from Last 3 Encounters:  08/17/17 (!) 150/70    08/04/17 (!) 148/66  06/28/17 (!) 118/56   Pulse Readings from Last 3 Encounters:  08/17/17 77  08/04/17 72  06/28/17 68    Physical Exam  Constitutional: She is oriented to person, place, and time. Vital signs are normal. She appears well-developed and well-nourished. She is cooperative.  HENT:  Head: Normocephalic and atraumatic.  Mouth/Throat: Oropharynx is clear and moist and mucous membranes are normal.  Eyes: Pupils are equal, round, and reactive to light. Conjunctivae are normal.  Cardiovascular: Normal rate, regular rhythm and normal heart sounds.  Pulmonary/Chest: Effort normal and breath sounds normal.  Neurological: She is alert and oriented to person, place, and time.  BL walks with cane    Skin: Skin is warm and dry.     Psychiatric: She has a normal mood and affect. Her speech is normal and behavior is normal. Judgment and thought content normal. Cognition and memory are normal.  Nursing note and vitals reviewed.   Assessment   1. C/w venous ulceration and PAD 2. HTN  3. Leg edema  4. HM Plan   1. Refer to [facility] Cleaned today sterile saline Wrapped with vasoline telfa telfa kerlix and coban  bactroban bid  2. Cont meds norvasc 5 mg qd, hyzaar 100-25 mg qd  3. Leg elevation  Hold compression stockings for now  Prn lasix 20 mg qod prn  4.  Had flu  shot 2018  Tdap 2016 need to verify  pna 23 03/03/17, need to ask about prevnar  Had zostervax disc shingrix in future   Colonoscopy ? 5 years ago out of age window now  S/p hysterectomy out of age window pap  Mammogram sch 08/12/2017 per Dr. Clayborn Bigness 08/12/17 rec Dx mammo and [place] right breast ordered today DEXA 12/31/14 femoral neck +osteoporosis, total femoral density osteopenia, spine normal  -consider repeat DEXA and prolia in the future  Dermatology f/u Dr. Evorn Gong h/o [facility] face   Pharmacy [facility], [facility] [address] and [facility]   Other specialists  Dr. Durel Salts  Dr. Kenton [facility]   [facility] in [place], Dr. Brien Few Former PCP Dr. Clayborn Bigness  -requested notes w/in 2 years vaccines, labs previous visit   Saw Dr. Jacqlyn Larsen 08/12/17 rec PTNS tx for overactive bladder f/u in 6 months     Provider: Dr. Olivia Mackie [facility]

## 2017-08-17 NOTE — Telephone Encounter (Signed)
Mammogram 08/12/17 with breast asymmetry on right and rec. Diagnostic mammogram and [place] which Ive ordered   Inform daughter [name] and pt    [place]

## 2017-08-17 NOTE — Patient Instructions (Addendum)
We will refer you to [facility] on [address] Dr. Lucky Cowboy or [place] Try to avoid compression stockings  You may take lasix as needed 20 mg for leg swelling  Use bactroban to leg ulcer 2x per day and use Telfa dressing with Coban/brown dressing or paper tape    Venous Ulcer A venous ulcer is a shallow sore on your lower leg. It is caused by poor circulation in your veins. Venous ulcer is the most common type of lower leg ulcer. You may have venous ulcers on one leg or on both legs. This condition most often develops around your ankles. This type of ulcer may last for a long time (chronic ulcer) or it may return often (recurrent ulcer). Follow these instructions at home: Wound care  Follow instructions from your doctor about: ? How to take care of your wound. ? When and how you should change your bandage (dressing). ? When you should remove your bandage. If your bandage is dry and gets stuck to your leg when you try to remove it, moisten or wet the bandage with saline solution or water. This helps you to remove it without harming your skin or wound.  Check your wound every day for signs of infection. Have a caregiver do this for you if you are not able to do it yourself. Watch for: ? More redness, swelling, or pain. ? More fluid or blood. ? Pus, warmth, or a bad smell. Medicines  Take over-the-counter and prescription medicines only as told by your doctor.  If you were prescribed an antibiotic medicine, take it or apply it as told by your doctor. Do not stop taking or using the antibiotic even if your condition improves. Activity  Do not stand or sit in one position for a long period of time. Rest with your legs raised during the day. If possible, keep your legs above your heart for 30 minutes, 3-4 times a day, or as told by your doctor.  Do not sit with your legs crossed.  Walk often to increase the blood flow in your legs. Ask your doctor what level of activity is safe  for you.  If you are taking a long ride in a car or plane, take a break to walk around at least once every two hours, or as told by your doctor. Ask your doctor if you should take aspirin before long trips. General instructions   Wear elastic stockings, compression stockings, or support hose as told by your doctor. This is very important.  Raise the foot of your bed as told by your doctor.  Do not smoke.  Keep all follow-up visits as told by your doctor. This is important. Contact a doctor if:  You have a fever.  Your ulcer is getting larger or is not healing.  Your pain gets worse.  You have more redness or swelling around your ulcer.  You have more fluid, blood, or pus coming from your ulcer after it has been cleaned by you or your doctor.  You have warmth or a bad smell coming from your ulcer. This information is not intended to replace advice given to you by your health care provider. Make sure you discuss any questions you have with your health care provider. Document Released: 04/09/2004 Document Revised: 08/08/2015 Document Reviewed: 07/11/2014 Elsevier Interactive Patient Education  2018 [place].  Peripheral Vascular Disease Peripheral vascular disease (PVD) is a disease of the blood vessels that are not part of your heart and  brain. A simple term for PVD is poor circulation. In most cases, PVD narrows the blood vessels that carry blood from your heart to the rest of your body. This can result in a decreased supply of blood to your arms, legs, and internal organs, like your stomach or kidneys. However, it most often affects a person's lower legs and feet. There are two types of PVD.  Organic PVD. This is the more common type. It is caused by damage to the structure of blood vessels.  Functional PVD. This is caused by conditions that make blood vessels contract and tighten (spasm).  Without treatment, PVD tends to get worse over time. PVD can also lead to acute  ischemic limb. This is when an arm or limb suddenly has trouble getting enough blood. This is a medical emergency. Follow these instructions at home:  Take medicines only as told by your doctor.  Do not use any tobacco products, including cigarettes, chewing tobacco, or electronic cigarettes. If you need help quitting, ask your doctor.  Lose weight if you are overweight, and maintain a healthy weight as told by your doctor.  Eat a diet that is low in fat and cholesterol. If you need help, ask your doctor.  Exercise regularly. Ask your doctor for some good activities for you.  Take good care of your feet. ? Wear comfortable shoes that fit well. ? Check your feet often for any cuts or sores. Contact a doctor if:  You have cramps in your legs while walking.  You have leg pain when you are at rest.  You have coldness in a leg or foot.  Your skin changes.  You are unable to get or have an erection (erectile dysfunction).  You have cuts or sores on your feet that are not healing. Get help right away if:  Your arm or leg turns cold and blue.  Your arms or legs become red, warm, swollen, painful, or numb.  You have chest pain or trouble breathing.  You suddenly have weakness in your face, arm, or leg.  You become very confused or you cannot speak.  You suddenly have a very bad headache.  You suddenly cannot see. This information is not intended to replace advice given to you by your health care provider. Make sure you discuss any questions you have with your health care provider. Document Released: 05/27/2009 Document Revised: 08/08/2015 Document Reviewed: 08/10/2013 Elsevier Interactive Patient Education  2017 [employer].

## 2017-08-18 NOTE — Telephone Encounter (Signed)
Unable to leave message for patient to return call back, due to phone ringing constantly. PEC may give results.

## 2017-08-20 ENCOUNTER — Ambulatory Visit (INDEPENDENT_AMBULATORY_CARE_PROVIDER_SITE_OTHER): Payer: Medicare Other | Admitting: Vascular Surgery

## 2017-08-20 ENCOUNTER — Encounter (INDEPENDENT_AMBULATORY_CARE_PROVIDER_SITE_OTHER): Payer: Self-pay | Admitting: Vascular Surgery

## 2017-08-20 ENCOUNTER — Other Ambulatory Visit (INDEPENDENT_AMBULATORY_CARE_PROVIDER_SITE_OTHER): Payer: Self-pay

## 2017-08-20 VITALS — BP 141/72 | HR 65 | Resp 16 | Ht 66.5 in | Wt 152.0 lb

## 2017-08-20 DIAGNOSIS — I10 Essential (primary) hypertension: Secondary | ICD-10-CM

## 2017-08-20 DIAGNOSIS — L97921 Non-pressure chronic ulcer of unspecified part of left lower leg limited to breakdown of skin: Secondary | ICD-10-CM | POA: Diagnosis not present

## 2017-08-20 DIAGNOSIS — I700 Atherosclerosis of aorta: Secondary | ICD-10-CM

## 2017-08-20 NOTE — Assessment & Plan Note (Signed)
The patient has a superficial ulceration just above the medial left ankle.  This is a very difficult and worrisome situation.  Given her advanced age, I am concerned about arterial insufficiency.  This is an atypical location for a venous ulceration so a venous reflux study should be done as well.  The patient will be placed in Unna boots to try to control the swelling and get the ulceration healed.  These will be changed weekly.  The noninvasive studies will be performed in the near future at her convenience.  We will see her back following the studies and after a couple of weeks of Unna boots to assess the improvement and determine further treatment options.

## 2017-08-20 NOTE — Patient Instructions (Signed)

## 2017-08-20 NOTE — Progress Notes (Signed)
Patient ID: Kristine Jacobson, female   DOB: [birth year], 84 y.o.   MRN: [id number]  Chief Complaint  Patient presents with  . New Patient (Initial Visit)    PAD with ulcers    HPI Kristine Jacobson is a 84 y.o. female.  I am asked to see the patient by Dr. Terese Door for evaluation of for evaluation of non-healing ulceration.  The patient reports several weeks of nonhealing ulceration just above the left medial ankle.  There was no trauma, injury, or inciting event that started the ulceration.  They have been doing local wound care with Bactroban and a gauze dressing.  Her primary care physician was very concerned about the wound not healing and possible lack of blood flow.  As such, she is referred for further evaluation and treatment.  The ulcer is very painful.  The pain comes and goes.  She has no fevers or chills.   Past Medical History:  Diagnosis Date  . Allergy    seasonal   . Arthritis    shoulders, hands  . Cancer ([place])    Plush face-Dr. Evorn Gong   . Chronic neck pain    2/2 bulging discs   . HBP (high blood pressure)   . Hearing aid worn    bilateral  . Macular degeneration    dx'ed 2015 dry now left wet and right mild wet [facility] Dr. Michelene Heady  . Macular degeneration, age related   . Osteoporosis   . Overactive bladder   . Restless leg   . Spinal stenosis   . Urinary incontinence    s/p monthly PTNS with Dr. Jacqlyn Larsen, no help with overactive bladder with meds nor Botox   . UTI (urinary tract infection)   . Vertigo    no episodes for several years  . Wears dentures    partial upper and lower    Past Surgical History:  Procedure Laterality Date  . ABDOMINAL HYSTERECTOMY     1967  . APPENDECTOMY     1967  . BLADDER SURGERY     bladder suspension; 2005, 2011-Dr. Cope  . BREAST EXCISIONAL BIOPSY Right 1970   neg  . BROW LIFT Bilateral 12/01/2016   Procedure: BLEPHAROPLASTY upper eyelid with excess skin;  Surgeon: Karle Starch, MD;  Location: [place];  Service: Ophthalmology;  Laterality: Bilateral;  MAC  . CARPAL TUNNEL RELEASE     right 2016   . Esophageal dilitation    . eyelid surgery     2018  . FRACTURE SURGERY    . HIP SURGERY     left 2016   . INTRAMEDULLARY (IM) NAIL INTERTROCHANTERIC Left 02/27/2015   Procedure: INTRAMEDULLARY (IM) NAIL INTERTROCHANTRIC;  Surgeon: Dereck Leep, MD;  Location: [facility];  Service: Orthopedics;  Laterality: Left;  . PTOSIS REPAIR Bilateral 12/01/2016   Procedure: PTOSIS REPAIR  repair resect ex;  Surgeon: Karle Starch, MD;  Location: [place];  Service: Ophthalmology;  Laterality: Bilateral;  . TONSILLECTOMY      Family History  Problem Relation Age of Onset  . Diabetes Mother   . Arthritis Mother   . Stroke Mother   . Heart disease Father   . Diabetes Sister   . Cancer Brother        lung 2/2 asbestos   . Diabetes Daughter   . Diabetes Brother   . Diabetes Brother   . Breast cancer Neg Hx      Social History Social History  Tobacco Use  . Smoking status: Never Smoker  . Smokeless tobacco: Never Used  Substance Use Topics  . Alcohol use: No  . Drug use: No  Lives in nearby facility  Allergies  Allergen Reactions  . Codeine Rash    Current Outpatient Medications  Medication Sig Dispense Refill  . acetaminophen (TYLENOL) 500 MG tablet Take 1,000 mg by mouth 2 (two) times daily as needed (pain).     Marland Kitchen amLODipine (NORVASC) 5 MG tablet Take 1 tablet (5 mg total) by mouth daily.    Marland Kitchen aspirin EC 81 MG tablet Take 81 mg by mouth at bedtime.    . bisacodyl (BISACODYL) 5 MG EC tablet Take 5 mg by mouth 2 (two) times a week. Take 1 tablet (5 mg) by mouth on Sunday and Wednesday nights (the night before Linzess dose)    . Calcium Carb-Cholecalciferol (CALCIUM 600 + D PO) Take 600 mg by mouth 2 (two) times daily.    . carvedilol (COREG) 3.125 MG tablet Take 3.125 mg by mouth 2 (two) times daily with a meal.    . doxycycline (VIBRAMYCIN) 100 MG capsule Take 100  mg by mouth daily with lunch. Continuous course for UTI prevention    . furosemide (LASIX) 20 MG tablet Take 1 tablet (20 mg total) by mouth every other day as needed. Leg edema prn 10 tablet 0  . gabapentin (NEURONTIN) 100 MG capsule TAKE TWO CAPSULES BY MOUTH THREE TIMES DAILY 540 capsule 0  . linaclotide (LINZESS) 145 MCG CAPS capsule Take 1 capsule (145 mcg total) by mouth 2 (two) times a week. Take 1 capsule (145 mcg) by mouth on Mondays and Thursdays at 2pm 30 capsule 2  . loratadine (CLARITIN) 10 MG tablet Take 10 mg by mouth daily.    Marland Kitchen losartan-hydrochlorothiazide (HYZAAR) 100-25 MG tablet Take 1 tablet by mouth daily. 90 tablet 3  . Multiple Vitamin (MULTIVITAMIN WITH MINERALS) TABS tablet Take 1 tablet by mouth daily. Centrum Silver    . Multiple Vitamins-Minerals (PRESERVISION AREDS 2) CAPS Take 1 capsule by mouth 2 (two) times daily.    . mupirocin ointment (BACTROBAN) 2 % Apply 1 application topically 2 (two) times daily. Left lower leg 30 g 0  . Propylene Glycol (SYSTANE BALANCE OP) Place 1 drop into both eyes 3 (three) times daily as needed (dry eyes).    Marland Kitchen rOPINIRole (REQUIP) 0.5 MG tablet TAKE 1 TABLET BY MOUTH IN THE MORNING, 1 TABLET BY MOUTH AT LUNCH, AND 1 TABLET BY MOUTH IN THE EVENING. 270 tablet 0  . traMADol (ULTRAM) 50 MG tablet Take 1 tablet (50 mg total) by mouth every 6 (six) hours as needed for moderate pain. (Patient taking differently: Take 50 mg by mouth daily as needed for moderate pain. ) 16 tablet 0   No current facility-administered medications for this visit.       REVIEW OF SYSTEMS (Negative unless checked)  Constitutional: _0 Weight loss  _1 Fever  _2 Chills Cardiac: _3 Chest pain   _4 Chest pressure   _5 Palpitations   _6 Shortness of breath when laying flat   _7 Shortness of breath at rest   _8 Shortness of breath with exertion. Vascular:  _9 Pain in legs with walking   _10 Pain in legs at rest   _11 Pain in legs when laying flat   _12 Claudication   _13 Pain in feet  when walking  _14 Pain in feet at rest  _15 Pain in feet when laying flat   _16 History of DVT   _17 Phlebitis   _18 Swelling in legs   _19   Varicose veins   _0 Non-healing ulcers Pulmonary:   _1 Uses home oxygen   _2 Productive cough   _3 Hemoptysis   _4 Wheeze  _5 COPD   _6 Asthma Neurologic:  _7 Dizziness  _8 Blackouts   _9 Seizures   _10 History of stroke   _11 History of TIA  _12 Aphasia   _13 Temporary blindness   _14 Dysphagia   _15 Weakness or numbness in arms   _16 Weakness or numbness in legs Musculoskeletal:  _17 Arthritis   _18 Joint swelling   _19 Joint pain   _20 Low back pain Hematologic:  _21 Easy bruising  _22 Easy bleeding   _23 Hypercoagulable state   _24 Anemic  _25 Hepatitis Gastrointestinal:  _26 Blood in stool   _27 Vomiting blood  _28 Gastroesophageal reflux/heartburn   _29 Abdominal pain Genitourinary:  _30 Chronic kidney disease   _31 Difficult urination  _32 Frequent urination  _33 Burning with urination   _34 Hematuria Skin:  _35 Rashes   _36 Ulcers   _37 Wounds Psychological:  _38 History of anxiety   _39  History of major depression.    Physical Exam BP (!) 141/72 (BP Location: Right Arm)   Pulse 65   Resp 16   Ht 5' 6.5" (1.689 m)   Wt 152 lb (68.9 kg)   BMI 24.17 kg/m  Gen:  WD/WN, NAD. appears far younger than stated age. Head: [place]/AT, No temporalis wasting.  Ear/Nose/Throat: Hearing somewhat diminished, nares w/o erythema or drainage, oropharynx w/o Erythema/Exudate Eyes: Conjunctiva clear, sclera non-icteric  Neck: trachea midline.  No JVD.  Pulmonary:  Good air movement, respirations not labored, no use of accessory muscles Cardiac: RRR Vascular:  Vessel Right Left  Radial Palpable Palpable                          PT 1+ Palpable Not Palpable  DP 1+ Palpable 1+ Palpable   Gastrointestinal: soft, non-tender/non-distended. Musculoskeletal: M/S 5/5 throughout.  1 to 2 cm circular ulceration just above the medial left ankle.  Moderate stasis changes are present in both lower extremities.  No deformity or atrophy.  1+  bilateral lower extremity edema. Neurologic: Sensation grossly intact in extremities.  Symmetrical.  Speech is fluent. Motor exam as listed above. Psychiatric: Judgment intact, Mood & affect appropriate for pt's clinical situation. Dermatologic: Left medial ankle ulceration as above    Radiology Mm Digital Screening  Result Date: 08/12/2017 CLINICAL DATA:  Screening. EXAM: DIGITAL SCREENING BILATERAL MAMMOGRAM WITH CAD COMPARISON:  Previous exam(s). ACR Breast Density Category b: There are scattered areas of fibroglandular density. FINDINGS: In the right breast, a possible asymmetry warrants further evaluation. In the left breast, no findings suspicious for malignancy. Images were processed with CAD. IMPRESSION: Further evaluation is suggested for possible asymmetry in the right breast. RECOMMENDATION: Diagnostic mammogram and possibly ultrasound of the right breast. (Code:[id number]-40M) The patient will be contacted regarding the findings, and additional imaging will be scheduled. BI-RADS CATEGORY  0: Incomplete. Need additional imaging evaluation and/or prior mammograms for comparison. Electronically Signed   By: Ammie Ferrier M.D.   On: 08/12/2017 14:48    Labs Recent Results (from the past 2160 hour(s))  Vitamin D (25 hydroxy)     Status: None   Collection Time: 08/02/17  9:57 AM  Result Value Ref Range   VITD 33.28 30.00 - 100.00 ng/mL  TSH     Status: None   Collection Time: 08/02/17  9:57 AM  Result Value Ref Range   TSH 2.64 0.35 - 4.50 uIU/mL  Urinalysis, Routine w reflex microscopic     Status: None   Collection Time: 08/02/17  9:57 AM  Result Value Ref  Range   Specific Gravity, UA 1.020 1.005 - 1.030   pH, UA 7.0 5.0 - 7.5   Color, UA Yellow Yellow   Appearance Ur Clear Clear   Leukocytes, UA Negative Negative   Protein, UA Negative Negative/Trace   Glucose, UA Negative Negative   Ketones, UA Negative Negative   RBC, UA Negative Negative   Bilirubin, UA Negative Negative     Urobilinogen, Ur 0.2 0.2 - 1.0 mg/dL   Nitrite, UA Negative Negative   Microscopic Examination Comment     Comment: Microscopic not indicated and not performed.  Lipid panel     Status: None   Collection Time: 08/02/17  9:57 AM  Result Value Ref Range   Cholesterol 171 0 - 200 mg/dL    Comment: ATP III Classification       Desirable:  < 200 mg/dL               Borderline High:  200 - 239 mg/dL          High:  > = 240 mg/dL   Triglycerides 134.0 0.0 - 149.0 mg/dL    Comment: Normal:  <150 mg/dLBorderline High:  150 - 199 mg/dL   HDL 47.70 >39.00 mg/dL   VLDL 26.8 0.0 - 40.0 mg/dL   LDL Cholesterol 97 0 - 99 mg/dL   Total CHOL/HDL Ratio 4     Comment:                Men          Women1/2 Average Risk     3.4          3.3Average Risk          5.0          4.42X Average Risk          9.6          7.13X Average Risk          15.0          11.0                       NonHDL 123.48     Comment: NOTE:  Non-HDL goal should be 30 mg/dL higher than patient's LDL goal (i.e. LDL goal of < 70 mg/dL, would have non-HDL goal of < 100 mg/dL)  CBC with Differential/Platelet     Status: None   Collection Time: 08/02/17  9:57 AM  Result Value Ref Range   WBC 5.0 4.0 - 10.5 K/uL   RBC 4.36 3.87 - 5.11 Mil/uL   Hemoglobin 13.1 12.0 - 15.0 g/dL   HCT 39.2 36.0 - 46.0 %   MCV 90.1 78.0 - 100.0 fl   MCHC 33.4 30.0 - 36.0 g/dL   RDW 13.8 11.5 - 15.5 %   Platelets 175.0 150.0 - 400.0 K/uL   Neutrophils Relative % 58.9 43.0 - 77.0 %   Lymphocytes Relative 28.3 12.0 - 46.0 %   Monocytes Relative 8.9 3.0 - 12.0 %   Eosinophils Relative 2.8 0.0 - 5.0 %   Basophils Relative 1.1 0.0 - 3.0 %   Neutro Abs 2.9 1.4 - 7.7 K/uL   Lymphs Abs 1.4 0.7 - 4.0 K/uL   Monocytes Absolute 0.4 0.1 - 1.0 K/uL   Eosinophils Absolute 0.1 0.0 - 0.7 K/uL   Basophils Absolute 0.1 0.0 - 0.1 K/uL  Comprehensive metabolic panel     Status: None   Collection Time: 08/02/17  9:57 AM  Result Value Ref Range   Sodium 141 135 - 145  mEq/L   Potassium 4.1 3.5 - 5.1 mEq/L   Chloride 104 96 - 112 mEq/L   CO2 28 19 - 32 mEq/L   Glucose, Bld 95 70 - 99 mg/dL   BUN 21 6 - 23 mg/dL   Creatinine, Ser 0.77 0.40 - 1.20 mg/dL   Total Bilirubin 0.4 0.2 - 1.2 mg/dL   Alkaline Phosphatase 46 39 - 117 U/L   AST 21 0 - 37 U/L   ALT 17 0 - 35 U/L   Total Protein 7.2 6.0 - 8.3 g/dL   Albumin 4.1 3.5 - 5.2 g/dL   Calcium 9.5 8.4 - 10.5 mg/dL   GFR 74.47 >60.00 mL/min    Assessment/Plan:  Hypertension blood pressure control important in reducing the progression of atherosclerotic disease. On appropriate oral medications.   Atherosclerosis of aorta ([place]) Certainly concern for arterial insufficiency creating a difficulty heel wound is present.  Arterial studies should be performed in the near future.  Ulcer of left lower extremity ([place]) The patient has a superficial ulceration just above the medial left ankle.  This is a very difficult and worrisome situation.  Given her advanced age, I am concerned about arterial insufficiency.  This is an atypical location for a venous ulceration so a venous reflux study should be done as well.  The patient will be placed in Unna boots to try to control the swelling and get the ulceration healed.  These will be changed weekly.  The noninvasive studies will be performed in the near future at her convenience.  We will see her back following the studies and after a couple of weeks of Unna boots to assess the improvement and determine further treatment options.      Leotis Pain 08/20/2017, 11:45 AM   This note was created with Dragon medical transcription system.  Any errors from dictation are unintentional.

## 2017-08-20 NOTE — Assessment & Plan Note (Signed)
blood pressure control important in reducing the progression of atherosclerotic disease. On appropriate oral medications.  

## 2017-08-20 NOTE — Assessment & Plan Note (Signed)
Certainly concern for arterial insufficiency creating a difficulty heel wound is present.  Arterial studies should be performed in the near future.

## 2017-08-24 ENCOUNTER — Encounter (INDEPENDENT_AMBULATORY_CARE_PROVIDER_SITE_OTHER): Payer: Medicare Other

## 2017-08-26 ENCOUNTER — Ambulatory Visit
Admission: RE | Admit: 2017-08-26 | Discharge: 2017-08-26 | Disposition: A | Discharge status: Home / Self Care | Payer: Medicare Other | Source: Ambulatory Visit | Attending: Internal Medicine | Admitting: Internal Medicine

## 2017-08-26 DIAGNOSIS — R928 Other abnormal and inconclusive findings on diagnostic imaging of breast: Secondary | ICD-10-CM

## 2017-08-26 DIAGNOSIS — L97921 Non-pressure chronic ulcer of unspecified part of left lower leg limited to breakdown of skin: Secondary | ICD-10-CM | POA: Diagnosis present

## 2017-08-27 ENCOUNTER — Ambulatory Visit
Admission: RE | Admit: 2017-08-27 | Discharge: 2017-08-27 | Disposition: A | Discharge status: Home / Self Care | Payer: Medicare Other | Source: Ambulatory Visit | Attending: Vascular Surgery | Admitting: Vascular Surgery

## 2017-08-27 ENCOUNTER — Encounter (INDEPENDENT_AMBULATORY_CARE_PROVIDER_SITE_OTHER): Payer: Self-pay

## 2017-08-27 ENCOUNTER — Ambulatory Visit (INDEPENDENT_AMBULATORY_CARE_PROVIDER_SITE_OTHER): Payer: Medicare Other | Admitting: Vascular Surgery

## 2017-08-27 VITALS — BP 146/65 | HR 66 | Resp 16 | Ht 66.5 in | Wt 154.0 lb

## 2017-08-27 DIAGNOSIS — L97921 Non-pressure chronic ulcer of unspecified part of left lower leg limited to breakdown of skin: Secondary | ICD-10-CM

## 2017-08-27 DIAGNOSIS — I10 Essential (primary) hypertension: Secondary | ICD-10-CM

## 2017-08-27 NOTE — Progress Notes (Signed)
History of Present Illness  There is no documented history at this time  Assessments & Plan   There are no diagnoses linked to this encounter.    Additional instructions  Subjective:  Patient presents with venous ulcer of the Left lower extremity.    Procedure:  3 layer unna wrap was placed Left lower extremity.   Plan:   Follow up in one week.  

## 2017-08-30 DIAGNOSIS — N3941 Urge incontinence: Secondary | ICD-10-CM | POA: Diagnosis not present

## 2017-09-02 ENCOUNTER — Other Ambulatory Visit: Payer: Medicare Other

## 2017-09-03 ENCOUNTER — Ambulatory Visit (INDEPENDENT_AMBULATORY_CARE_PROVIDER_SITE_OTHER): Payer: Medicare Other | Admitting: Vascular Surgery

## 2017-09-03 ENCOUNTER — Ambulatory Visit
Admission: RE | Admit: 2017-09-03 | Discharge: 2017-09-03 | Disposition: A | Discharge status: Home / Self Care | Payer: Medicare Other | Source: Ambulatory Visit | Attending: Vascular Surgery | Admitting: Vascular Surgery

## 2017-09-03 ENCOUNTER — Encounter (INDEPENDENT_AMBULATORY_CARE_PROVIDER_SITE_OTHER): Payer: Self-pay

## 2017-09-03 VITALS — BP 140/68 | HR 64 | Resp 16 | Ht 66.5 in

## 2017-09-03 DIAGNOSIS — L97921 Non-pressure chronic ulcer of unspecified part of left lower leg limited to breakdown of skin: Secondary | ICD-10-CM | POA: Insufficient documentation

## 2017-09-03 DIAGNOSIS — I739 Peripheral vascular disease, unspecified: Secondary | ICD-10-CM | POA: Diagnosis not present

## 2017-09-03 NOTE — Progress Notes (Signed)
History of Present Illness  There is no documented history at this time  Assessments & Plan   There are no diagnoses linked to this encounter.    Additional instructions  Subjective:  Patient presents with venous ulcer of the Left lower extremity.    Procedure:  3 layer unna wrap was placed Left lower extremity.   Plan:   Follow up in one week.  

## 2017-09-10 ENCOUNTER — Encounter (INDEPENDENT_AMBULATORY_CARE_PROVIDER_SITE_OTHER): Payer: Self-pay

## 2017-09-10 ENCOUNTER — Ambulatory Visit (INDEPENDENT_AMBULATORY_CARE_PROVIDER_SITE_OTHER): Payer: Medicare Other | Admitting: Vascular Surgery

## 2017-09-10 VITALS — BP 151/68 | HR 75 | Resp 16 | Ht 66.5 in | Wt 153.6 lb

## 2017-09-10 DIAGNOSIS — L97921 Non-pressure chronic ulcer of unspecified part of left lower leg limited to breakdown of skin: Secondary | ICD-10-CM

## 2017-09-10 NOTE — Progress Notes (Signed)
History of Present Illness  There is no documented history at this time  Assessments & Plan   There are no diagnoses linked to this encounter.    Additional instructions  Subjective:  Patient presents with venous ulcer of the Left lower extremity.    Procedure:  3 layer unna wrap was placed Left lower extremity.   Plan:   Follow up in one week.  

## 2017-09-17 ENCOUNTER — Ambulatory Visit (INDEPENDENT_AMBULATORY_CARE_PROVIDER_SITE_OTHER): Payer: Medicare Other | Admitting: Vascular Surgery

## 2017-09-17 ENCOUNTER — Encounter (INDEPENDENT_AMBULATORY_CARE_PROVIDER_SITE_OTHER): Payer: Self-pay | Admitting: Vascular Surgery

## 2017-09-17 VITALS — BP 173/79 | HR 68 | Resp 16 | Ht 66.5 in | Wt 153.8 lb

## 2017-09-17 DIAGNOSIS — I7025 Atherosclerosis of native arteries of other extremities with ulceration: Secondary | ICD-10-CM | POA: Diagnosis not present

## 2017-09-17 DIAGNOSIS — L97921 Non-pressure chronic ulcer of unspecified part of left lower leg limited to breakdown of skin: Secondary | ICD-10-CM

## 2017-09-17 NOTE — Progress Notes (Signed)
MRN : [id number]  Kristine Jacobson is a 84 y.o. ([birth year]) female who presents with chief complaint of  Chief Complaint  Patient presents with  . Follow-up    unna check and abi results  .  History of Present Illness: Patient returns today in follow up of her left lower leg wound.  This is markedly improved with several weeks of Unna boots.  This is now a pinhole size just above the medial left ankle.  It is far less painful.  Her swelling is much better.  She did have noninvasive arterial studies which showed monophasic blood flow bilaterally although the ABIs themselves were technically normal.  I suspect there is some degree of medial calcification falsely elevating the ABIs monophasic flow.  Current Outpatient Medications  Medication Sig Dispense Refill  . acetaminophen (TYLENOL) 500 MG tablet Take 1,000 mg by mouth 2 (two) times daily as needed (pain).     Marland Kitchen amLODipine (NORVASC) 5 MG tablet Take 1 tablet (5 mg total) by mouth daily.    Marland Kitchen aspirin EC 81 MG tablet Take 81 mg by mouth at bedtime.    . bisacodyl (BISACODYL) 5 MG EC tablet Take 5 mg by mouth 2 (two) times a week. Take 1 tablet (5 mg) by mouth on Sunday and Wednesday nights (the night before Linzess dose)    . Calcium Carb-Cholecalciferol (CALCIUM 600 + D PO) Take 600 mg by mouth 2 (two) times daily.    . carvedilol (COREG) 3.125 MG tablet Take 3.125 mg by mouth 2 (two) times daily with a meal.    . doxycycline (VIBRAMYCIN) 100 MG capsule Take 100 mg by mouth daily with lunch. Continuous course for UTI prevention    . furosemide (LASIX) 20 MG tablet Take 1 tablet (20 mg total) by mouth every other day as needed. Leg edema prn 10 tablet 0  . gabapentin (NEURONTIN) 100 MG capsule TAKE TWO CAPSULES BY MOUTH THREE TIMES DAILY 540 capsule 0  . linaclotide (LINZESS) 145 MCG CAPS capsule Take 1 capsule (145 mcg total) by mouth 2 (two) times a week. Take 1 capsule (145 mcg) by mouth on Mondays and Thursdays at 2pm 30 capsule 2  .  loratadine (CLARITIN) 10 MG tablet Take 10 mg by mouth daily.    Marland Kitchen losartan-hydrochlorothiazide (HYZAAR) 100-25 MG tablet Take 1 tablet by mouth daily. 90 tablet 3  . Multiple Vitamin (MULTIVITAMIN WITH MINERALS) TABS tablet Take 1 tablet by mouth daily. Centrum Silver    . Multiple Vitamins-Minerals (PRESERVISION AREDS 2) CAPS Take 1 capsule by mouth 2 (two) times daily.    . mupirocin ointment (BACTROBAN) 2 % Apply 1 application topically 2 (two) times daily. Left lower leg 30 g 0  . Propylene Glycol (SYSTANE BALANCE OP) Place 1 drop into both eyes 3 (three) times daily as needed (dry eyes).    Marland Kitchen rOPINIRole (REQUIP) 0.5 MG tablet TAKE 1 TABLET BY MOUTH IN THE MORNING, 1 TABLET BY MOUTH AT LUNCH, AND 1 TABLET BY MOUTH IN THE EVENING. 270 tablet 0  . traMADol (ULTRAM) 50 MG tablet Take 1 tablet (50 mg total) by mouth every 6 (six) hours as needed for moderate pain. (Patient taking differently: Take 50 mg by mouth daily as needed for moderate pain. ) 16 tablet 0   No current facility-administered medications for this visit.     Past Medical History:  Diagnosis Date  . Allergy    seasonal   . Arthritis    shoulders, hands  .  Cancer ([place])    [place] [facility]-Dr. Evorn Gong   . Chronic neck pain    2/2 bulging discs   . HBP (high blood pressure)   . Hearing aid worn    bilateral  . Macular degeneration    dx'ed 2015 dry now left wet and right mild wet [facility] Dr. Michelene Heady  . Macular degeneration, age related   . Osteoporosis   . Overactive bladder   . Restless leg   . Spinal stenosis   . Urinary incontinence    s/p monthly PTNS with Dr. Jacqlyn Larsen, no help with overactive bladder with meds nor Botox   . UTI (urinary tract infection)   . Vertigo    no episodes for several years  . Wears dentures    partial upper and lower    Past Surgical History:  Procedure Laterality Date  . ABDOMINAL HYSTERECTOMY     1967  . APPENDECTOMY     1967  . BLADDER SURGERY     bladder suspension; 2005,  2011-Dr. Cope  . BREAST EXCISIONAL BIOPSY Right 1970   neg  . BROW LIFT Bilateral 12/01/2016   Procedure: BLEPHAROPLASTY upper eyelid with excess skin;  Surgeon: Karle Starch, MD;  Location: [place];  Service: Ophthalmology;  Laterality: Bilateral;  MAC  . CARPAL TUNNEL RELEASE     right 2016   . Esophageal dilitation    . eyelid surgery     2018  . FRACTURE SURGERY    . HIP SURGERY     left 2016   . INTRAMEDULLARY (IM) NAIL INTERTROCHANTERIC Left 02/27/2015   Procedure: INTRAMEDULLARY (IM) NAIL INTERTROCHANTRIC;  Surgeon: Dereck Leep, MD;  Location: [facility];  Service: Orthopedics;  Laterality: Left;  . PTOSIS REPAIR Bilateral 12/01/2016   Procedure: PTOSIS REPAIR  repair resect ex;  Surgeon: Karle Starch, MD;  Location: [place];  Service: Ophthalmology;  Laterality: Bilateral;  . TONSILLECTOMY      Family History  Problem Relation Age of Onset  . Diabetes Mother   . Arthritis Mother   . Stroke Mother   . Heart disease Father   . Diabetes Sister   . Cancer Brother        lung 2/2 asbestos   . Diabetes Daughter   . Diabetes Brother   . Diabetes Brother   . Breast cancer Neg Hx      Social History Social History   Tobacco Use  . Smoking status: Never Smoker  . Smokeless tobacco: Never Used  Substance Use Topics  . Alcohol use: No  . Drug use: No  Lives in nearby facility      Allergies  Allergen Reactions  . Codeine Rash       REVIEW OF SYSTEMS (Negative unless checked)  Constitutional: _0 Weight loss  _1 Fever  _2 Chills Cardiac: _3 Chest pain   _4 Chest pressure   _5 Palpitations   _6 Shortness of breath when laying flat   _7 Shortness of breath at rest   _8 Shortness of breath with exertion. Vascular:  _9 Pain in legs with walking   _10 Pain in legs at rest   _11 Pain in legs when laying flat   _12 Claudication   _13 Pain in feet when walking  _14 Pain in feet at rest  _15 Pain in feet when laying flat   _16 History of DVT    _17 Phlebitis   _18 Swelling in legs   _19 Varicose veins   _20 Non-healing ulcers Pulmonary:   _21 Uses home oxygen   _22 Productive cough   _23 Hemoptysis   _24 Wheeze  _25 COPD   _26   Asthma Neurologic:  _0 Dizziness  _1 Blackouts   _2 Seizures   _3 History of stroke   _4 History of TIA  _5 Aphasia   _6 Temporary blindness   _7 Dysphagia   _8 Weakness or numbness in arms   _9 Weakness or numbness in legs Musculoskeletal:  _10 Arthritis   _11 Joint swelling   _12 Joint pain   _13 Low back pain Hematologic:  _14 Easy bruising  _15 Easy bleeding   _16 Hypercoagulable state   _17 Anemic  _18 Hepatitis Gastrointestinal:  _19 Blood in stool   _20 Vomiting blood  _21 Gastroesophageal reflux/heartburn   _22 Abdominal pain Genitourinary:  _23 Chronic kidney disease   _24 Difficult urination  _25 Frequent urination  _26 Burning with urination   _27 Hematuria Skin:  _28 Rashes   _29 Ulcers   _30 Wounds Psychological:  _31 History of anxiety   _32  History of major depression.     Physical Examination  BP (!) 173/79 (BP Location: Right Arm)   Pulse 68   Resp 16   Ht 5' 6.5" (1.689 m)   Wt 153 lb 12.8 oz (69.8 kg)   BMI 24.45 kg/m  Gen:  WD/WN, NAD.  Appears far younger than stated age Head: [name]/AT, No temporalis wasting. Ear/Nose/Throat: Hearing diminished, nares w/o erythema or drainage Eyes: Conjunctiva clear. Sclera non-icteric Neck: Supple.  Trachea midline Pulmonary:  Good air movement, no use of accessory muscles.  Cardiac: irregular Vascular:  Vessel Right Left  Radial Palpable Palpable                          PT 1+ Palpable Trace Palpable  DP 1+ Palpable 1+ Palpable    Musculoskeletal: M/S 5/5 throughout.  No deformity or atrophy.  1-2+ right lower extremity edema, 1+ left lower extremity edema.  Small pinhole ulceration just above the medial left ankle without erythema or drainage. Neurologic: Sensation grossly intact in extremities.  Symmetrical.  Speech is fluent.  Psychiatric: Judgment intact, Mood & affect appropriate for pt's clinical  situation. Dermatologic: Left ankle ulceration as above       Labs Recent Results (from the past 2160 hour(s))  Vitamin D (25 hydroxy)     Status: None   Collection Time: 08/02/17  9:57 AM  Result Value Ref Range   VITD 33.28 30.00 - 100.00 ng/mL  TSH     Status: None   Collection Time: 08/02/17  9:57 AM  Result Value Ref Range   TSH 2.64 0.35 - 4.50 uIU/mL  Urinalysis, Routine w reflex microscopic     Status: None   Collection Time: 08/02/17  9:57 AM  Result Value Ref Range   Specific Gravity, UA 1.020 1.005 - 1.030   pH, UA 7.0 5.0 - 7.5   Color, UA Yellow Yellow   Appearance Ur Clear Clear   Leukocytes, UA Negative Negative   Protein, UA Negative Negative/Trace   Glucose, UA Negative Negative   Ketones, UA Negative Negative   RBC, UA Negative Negative   Bilirubin, UA Negative Negative   Urobilinogen, Ur 0.2 0.2 - 1.0 mg/dL   Nitrite, UA Negative Negative   Microscopic Examination Comment     Comment: Microscopic not indicated and not performed.  Lipid panel     Status: None   Collection Time: 08/02/17  9:57 AM  Result Value Ref Range   Cholesterol 171 0 - 200 mg/dL    Comment: ATP III Classification       Desirable:  < 200 mg/dL               Borderline High:  200 - 239 mg/dL  High:  > = 240 mg/dL   Triglycerides 134.0 0.0 - 149.0 mg/dL    Comment: Normal:  <150 mg/dLBorderline High:  150 - 199 mg/dL   HDL 47.70 >39.00 mg/dL   VLDL 26.8 0.0 - 40.0 mg/dL   LDL Cholesterol 97 0 - 99 mg/dL   Total CHOL/HDL Ratio 4     Comment:                Men          Women1/2 Average Risk     3.4          3.3Average Risk          5.0          4.42X Average Risk          9.6          7.13X Average Risk          15.0          11.0                       NonHDL 123.48     Comment: NOTE:  Non-HDL goal should be 30 mg/dL higher than patient's LDL goal (i.e. LDL goal of < 70 mg/dL, would have non-HDL goal of < 100 mg/dL)  CBC with Differential/Platelet     Status: None    Collection Time: 08/02/17  9:57 AM  Result Value Ref Range   WBC 5.0 4.0 - 10.5 K/uL   RBC 4.36 3.87 - 5.11 Mil/uL   Hemoglobin 13.1 12.0 - 15.0 g/dL   HCT 39.2 36.0 - 46.0 %   MCV 90.1 78.0 - 100.0 fl   MCHC 33.4 30.0 - 36.0 g/dL   RDW 13.8 11.5 - 15.5 %   Platelets 175.0 150.0 - 400.0 K/uL   Neutrophils Relative % 58.9 43.0 - 77.0 %   Lymphocytes Relative 28.3 12.0 - 46.0 %   Monocytes Relative 8.9 3.0 - 12.0 %   Eosinophils Relative 2.8 0.0 - 5.0 %   Basophils Relative 1.1 0.0 - 3.0 %   Neutro Abs 2.9 1.4 - 7.7 K/uL   Lymphs Abs 1.4 0.7 - 4.0 K/uL   Monocytes Absolute 0.4 0.1 - 1.0 K/uL   Eosinophils Absolute 0.1 0.0 - 0.7 K/uL   Basophils Absolute 0.1 0.0 - 0.1 K/uL  Comprehensive metabolic panel     Status: None   Collection Time: 08/02/17  9:57 AM  Result Value Ref Range   Sodium 141 135 - 145 mEq/L   Potassium 4.1 3.5 - 5.1 mEq/L   Chloride 104 96 - 112 mEq/L   CO2 28 19 - 32 mEq/L   Glucose, Bld 95 70 - 99 mg/dL   BUN 21 6 - 23 mg/dL   Creatinine, Ser 0.77 0.40 - 1.20 mg/dL   Total Bilirubin 0.4 0.2 - 1.2 mg/dL   Alkaline Phosphatase 46 39 - 117 U/L   AST 21 0 - 37 U/L   ALT 17 0 - 35 U/L   Total Protein 7.2 6.0 - 8.3 g/dL   Albumin 4.1 3.5 - 5.2 g/dL   Calcium 9.5 8.4 - 10.5 mg/dL   GFR 74.47 >60.00 mL/min    Radiology US Arterial Seg Multiple  Result Date: 09/03/2017 CLINICAL DATA:  Left lower extremity claudication and rest pain. EXAM: NONINVASIVE PHYSIOLOGIC VASCULAR STUDY OF BILATERAL LOWER EXTREMITIES TECHNIQUE: Non-invasive vascular evaluation of both lower extremities was performed at rest, including calculation of ankle-brachial indices,  multiple segmental pressure evaluation, segmental Doppler and segmental pulse volume recording. COMPARISON:  None. FINDINGS: Right Lower Extremity Resting ABI:  1.14 Resting TBI: Not applicable Segmental Pressures: Pressures are elevated likely due to calcified vasculature. Great toe pressure: Not performed Arterial  Waveforms: Diffusely monophasic. PVRs: Normal PVRs with maintained waveform amplitude, augmentation and quality. Left Lower Extremity: Resting ABI: 1.03 Resting TBI: Not applicable Segmental Pressures: Pressures are elevated likely due to calcified vasculature. Great toe pressure: Not performed Arterial Waveforms: Femoral, superficial femoral, and popliteal waveforms are biphasic. Tibial waveforms are monophasic. PVRs: Normal PVRs with maintained waveform amplitude, augmentation and quality. Other: Symmetric upper extremity pressures. Ankle Brachial index > 1.4 Non diagnostic secondary to incompressible vessel calcifications 1.0-1.4       Normal 0.9-0.99     Borderline PAD 0.8-0.89     Mild PAD 0.5-0.79     Moderate PAD < 0.5          Severe PAD Toe Brachial Index Normal     >0.65 Moderate  0.53-0.64 Severe     <0.23 Toe Pressures Absolute toe pressure >52mHg sufficient for wound healing. Toe pressures <529mg = critical limb ischemia. IMPRESSION: Significant right iliac inflow disease. There is at least some degree of left iliac inflow disease. There are monophasic left tibial waveforms suggesting some degree of left distal popliteal or tibial arterial disease. Electronically Signed   By: ArMarybelle Killings.D.   On: 09/03/2017 11:30   Mm Diag Breast Tomo Uni Right  Result Date: 08/26/2017 CLINICAL DATA:  [age]ear old female for further evaluation of possible RIGHT breast asymmetry on screening mammogram. EXAM: DIGITAL DIAGNOSTIC UNILATERAL RIGHT MAMMOGRAM WITH CAD AND TOMO COMPARISON:  Previous exam(s). ACR Breast Density Category b: There are scattered areas of fibroglandular density. FINDINGS: 2D/3D full field views of the RIGHT breast demonstrate no persistent abnormality in the area of the screening study finding. No suspicious mass, distortion or worrisome calcifications noted. Mammographic images were processed with CAD. IMPRESSION: No persistent abnormality in the area of the screening study finding.  RECOMMENDATION: Bilateral screening mammograms in 1 year as clinically indicated. I have discussed the findings and recommendations with the patient. Results were also provided in writing at the conclusion of the visit. If applicable, a reminder letter will be sent to the patient regarding the next appointment. BI-RADS CATEGORY  1: Negative. Electronically Signed   By: JeMargarette Canada.D.   On: 08/26/2017 13:42    Assessment/Plan Hypertension blood pressure control important in reducing the progression of atherosclerotic disease. On appropriate oral medications.   Atherosclerosis of native arteries of the extremities with ulceration (HCClintonShe did have noninvasive arterial studies which showed monophasic blood flow bilaterally although the ABIs themselves were technically normal.  I suspect there is some degree of medial calcification falsely elevating the ABIs monophasic flow. Her wound is significantly improved, and I think given her age and the dramatic improvement thus far continued local wound care with an Unna boot would be reasonable.  A 3 layer Unna boot was placed today.  She is scheduled for a reflux study in the future.  We will continue weekly Unna boots and I will reassess her in 1 month.  Ulcer of left lower extremity (HCErieHer wound is significantly improved, and I think given her age and the dramatic improvement thus far continued local wound care with an Unna boot would be reasonable.  A 3 layer Unna boot was placed today.  She is scheduled for a reflux study in the future.  We will continue weekly Unna boots and I will reassess her in 1 month.    Leotis Pain, MD  09/17/2017 3:06 PM    This note was created with Dragon medical transcription system.  Any errors from dictation are purely unintentional

## 2017-09-17 NOTE — Assessment & Plan Note (Signed)
She did have noninvasive arterial studies which showed monophasic blood flow bilaterally although the ABIs themselves were technically normal.  I suspect there is some degree of medial calcification falsely elevating the ABIs monophasic flow. Her wound is significantly improved, and I think given her age and the dramatic improvement thus far continued local wound care with an Unna boot would be reasonable.  A 3 layer Unna boot was placed today.  She is scheduled for a reflux study in the future.  We will continue weekly Unna boots and I will reassess her in 1 month.

## 2017-09-17 NOTE — Assessment & Plan Note (Signed)
Her wound is significantly improved, and I think given her age and the dramatic improvement thus far continued local wound care with an Unna boot would be reasonable.  A 3 layer Unna boot was placed today.  She is scheduled for a reflux study in the future.  We will continue weekly Unna boots and I will reassess her in 1 month.

## 2017-09-24 ENCOUNTER — Encounter (INDEPENDENT_AMBULATORY_CARE_PROVIDER_SITE_OTHER): Payer: Self-pay

## 2017-09-24 ENCOUNTER — Ambulatory Visit (INDEPENDENT_AMBULATORY_CARE_PROVIDER_SITE_OTHER): Payer: Medicare Other | Admitting: Vascular Surgery

## 2017-09-24 VITALS — BP 147/65 | HR 69 | Resp 13 | Ht 65.5 in | Wt 156.0 lb

## 2017-09-24 DIAGNOSIS — L97921 Non-pressure chronic ulcer of unspecified part of left lower leg limited to breakdown of skin: Secondary | ICD-10-CM

## 2017-09-24 NOTE — Progress Notes (Signed)
History of Present Illness  There is no documented history at this time  Assessments & Plan   There are no diagnoses linked to this encounter.    Additional instructions  Subjective:  Patient presents with venous ulcer of the Left lower extremity.    Procedure:  3 layer unna wrap was placed Left lower extremity.   Plan:   Follow up in one week.  

## 2017-10-01 ENCOUNTER — Ambulatory Visit (INDEPENDENT_AMBULATORY_CARE_PROVIDER_SITE_OTHER): Payer: Medicare Other | Admitting: Vascular Surgery

## 2017-10-01 ENCOUNTER — Encounter (INDEPENDENT_AMBULATORY_CARE_PROVIDER_SITE_OTHER): Payer: Self-pay

## 2017-10-01 VITALS — BP 151/69 | HR 67 | Resp 16 | Ht 65.5 in | Wt 154.0 lb

## 2017-10-01 DIAGNOSIS — R600 Localized edema: Secondary | ICD-10-CM

## 2017-10-01 NOTE — Progress Notes (Signed)
History of Present Illness  There is no documented history at this time  Assessments & Plan   There are no diagnoses linked to this encounter.    Additional instructions  Subjective:  Patient presents with venous ulcer of the Left lower extremity.    Procedure:  3 layer unna wrap was placed Left lower extremity.   Plan:   Follow up in one week.  

## 2017-10-08 ENCOUNTER — Encounter (INDEPENDENT_AMBULATORY_CARE_PROVIDER_SITE_OTHER): Payer: Self-pay

## 2017-10-08 ENCOUNTER — Ambulatory Visit (INDEPENDENT_AMBULATORY_CARE_PROVIDER_SITE_OTHER): Payer: Medicare Other | Admitting: Vascular Surgery

## 2017-10-08 VITALS — BP 160/68 | HR 67 | Resp 16 | Ht 67.0 in | Wt 153.0 lb

## 2017-10-08 DIAGNOSIS — R600 Localized edema: Secondary | ICD-10-CM | POA: Diagnosis not present

## 2017-10-08 NOTE — Progress Notes (Signed)
History of Present Illness  There is no documented history at this time  Assessments & Plan   There are no diagnoses linked to this encounter.    Additional instructions  Subjective:  Patient presents with venous ulcer of the Left lower extremity.    Procedure:  3 layer unna wrap was placed Left lower extremity.   Plan:   Follow up in one week.  

## 2017-10-11 DIAGNOSIS — H353221 Exudative age-related macular degeneration, left eye, with active choroidal neovascularization: Secondary | ICD-10-CM | POA: Diagnosis not present

## 2017-10-13 DIAGNOSIS — R350 Frequency of micturition: Secondary | ICD-10-CM | POA: Diagnosis not present

## 2017-10-19 ENCOUNTER — Encounter (INDEPENDENT_AMBULATORY_CARE_PROVIDER_SITE_OTHER): Payer: Self-pay | Admitting: Vascular Surgery

## 2017-10-19 ENCOUNTER — Ambulatory Visit (INDEPENDENT_AMBULATORY_CARE_PROVIDER_SITE_OTHER): Payer: Medicare Other | Admitting: Vascular Surgery

## 2017-10-19 DIAGNOSIS — L97921 Non-pressure chronic ulcer of unspecified part of left lower leg limited to breakdown of skin: Secondary | ICD-10-CM | POA: Diagnosis not present

## 2017-10-19 DIAGNOSIS — I7025 Atherosclerosis of native arteries of other extremities with ulceration: Secondary | ICD-10-CM

## 2017-10-19 NOTE — Progress Notes (Signed)
MRN : [id number]  Kristine Jacobson is a 84 y.o. (01-13-1924) female who presents with chief complaint of  Chief Complaint  Patient presents with  . Follow-up    Unna boot check  .  History of Present Illness: Patient returns today in follow up of left leg ulceration and swelling.  Her ulcer is still not completely healed but generally improved.  The swelling is significantly better.  She has been wearing compression stockings on her right leg and doing Unna boots on the left leg.  Current Outpatient Medications  Medication Sig Dispense Refill  . acetaminophen (TYLENOL) 500 MG tablet Take 1,000 mg by mouth 2 (two) times daily as needed (pain).     Marland Kitchen amLODipine (NORVASC) 5 MG tablet Take 1 tablet (5 mg total) by mouth daily.    Marland Kitchen aspirin EC 81 MG tablet Take 81 mg by mouth at bedtime.    . bisacodyl (BISACODYL) 5 MG EC tablet Take 5 mg by mouth 2 (two) times a week. Take 1 tablet (5 mg) by mouth on Sunday and Wednesday nights (the night before Linzess dose)    . Calcium Carb-Cholecalciferol (CALCIUM 600 + D PO) Take 600 mg by mouth 2 (two) times daily.    . Calcium Carbonate-Vitamin D (CALTRATE 600+D) 600-400 MG-UNIT tablet Take by mouth.    . carvedilol (COREG) 3.125 MG tablet Take 3.125 mg by mouth 2 (two) times daily with a meal.    . doxycycline (VIBRAMYCIN) 100 MG capsule Take 100 mg by mouth daily with lunch. Continuous course for UTI prevention    . furosemide (LASIX) 20 MG tablet Take 1 tablet (20 mg total) by mouth every other day as needed. Leg edema prn 10 tablet 0  . gabapentin (NEURONTIN) 100 MG capsule TAKE TWO CAPSULES BY MOUTH THREE TIMES DAILY 540 capsule 0  . linaclotide (LINZESS) 145 MCG CAPS capsule Take 1 capsule (145 mcg total) by mouth 2 (two) times a week. Take 1 capsule (145 mcg) by mouth on Mondays and Thursdays at 2pm 30 capsule 2  . loratadine (CLARITIN) 10 MG tablet Take 10 mg by mouth daily.    Marland Kitchen losartan-hydrochlorothiazide (HYZAAR) 100-25 MG tablet Take 1 tablet  by mouth daily. 90 tablet 3  . Multiple Vitamin (MULTIVITAMIN WITH MINERALS) TABS tablet Take 1 tablet by mouth daily. Centrum Silver    . Multiple Vitamins-Minerals (PRESERVISION AREDS 2) CAPS Take 1 capsule by mouth 2 (two) times daily.    . mupirocin ointment (BACTROBAN) 2 % Apply 1 application topically 2 (two) times daily. Left lower leg 30 g 0  . Propylene Glycol (SYSTANE BALANCE OP) Place 1 drop into both eyes 3 (three) times daily as needed (dry eyes).    Marland Kitchen rOPINIRole (REQUIP) 0.5 MG tablet TAKE 1 TABLET BY MOUTH IN THE MORNING, 1 TABLET BY MOUTH AT LUNCH, AND 1 TABLET BY MOUTH IN THE EVENING. 270 tablet 0  . traMADol (ULTRAM) 50 MG tablet Take 1 tablet (50 mg total) by mouth every 6 (six) hours as needed for moderate pain. (Patient taking differently: Take 50 mg by mouth daily as needed for moderate pain. ) 16 tablet 0   No current facility-administered medications for this visit.     Past Medical History:  Diagnosis Date  . Allergy    seasonal   . Arthritis    shoulders, hands  . Cancer ([place])    [place] face-Dr. Evorn Gong   . Chronic neck pain    2/2 bulging discs   .  HBP (high blood pressure)   . Hearing aid worn    bilateral  . Macular degeneration    dx'ed 2015 dry now left wet and right mild wet [facility] Dr. Michelene Heady  . Macular degeneration, age related   . Osteoporosis   . Overactive bladder   . Restless leg   . Spinal stenosis   . Urinary incontinence    s/p monthly PTNS with Dr. Jacqlyn Larsen, no help with overactive bladder with meds nor Botox   . UTI (urinary tract infection)   . Vertigo    no episodes for several years  . Wears dentures    partial upper and lower    Past Surgical History:  Procedure Laterality Date  . ABDOMINAL HYSTERECTOMY     1967  . APPENDECTOMY     1967  . BLADDER SURGERY     bladder suspension; 2005, 2011-Dr. Cope  . BREAST EXCISIONAL BIOPSY Right 1970   neg  . BROW LIFT Bilateral 12/01/2016   Procedure: BLEPHAROPLASTY upper eyelid with  excess skin;  Surgeon: Karle Starch, MD;  Location: [facility];  Service: Ophthalmology;  Laterality: Bilateral;  MAC  . CARPAL TUNNEL RELEASE     right 2016   . Esophageal dilitation    . eyelid surgery     2018  . FRACTURE SURGERY    . HIP SURGERY     left 2016   . INTRAMEDULLARY (IM) NAIL INTERTROCHANTERIC Left 02/27/2015   Procedure: INTRAMEDULLARY (IM) NAIL INTERTROCHANTRIC;  Surgeon: Dereck Leep, MD;  Location: [facility];  Service: Orthopedics;  Laterality: Left;  . PTOSIS REPAIR Bilateral 12/01/2016   Procedure: PTOSIS REPAIR  repair resect ex;  Surgeon: Karle Starch, MD;  Location: [facility];  Service: Ophthalmology;  Laterality: Bilateral;  . TONSILLECTOMY      Family History  Problem Relation Age of Onset  . Diabetes Mother   . Arthritis Mother   . Stroke Mother   . Heart disease Father   . Diabetes Sister   . Cancer Brother    lung 2/2 asbestos   . Diabetes Daughter   . Diabetes Brother   . Diabetes Brother   . Breast cancer Neg Hx      Social History Social History       Tobacco Use  . Smoking status: Never Smoker  . Smokeless tobacco: Never Used  Substance Use Topics  . Alcohol use: No  . Drug use: No  Lives in nearby facility      Allergies  Allergen Reactions  . Codeine Rash       REVIEW OF SYSTEMS(Negative unless checked)  Constitutional: [] Weight loss[] Fever[] Chills Cardiac:[] Chest pain[] Chest pressure[] Palpitations [] Shortness of breath when laying flat [] Shortness of breath at rest [] Shortness of breath with exertion. Vascular: [] Pain in legs with walking[] Pain in legsat rest[] Pain in legs when laying flat [] Claudication [] Pain in feet when walking [] Pain in feet at rest [] Pain in feet when laying flat [] History of DVT [] Phlebitis [x] Swelling in legs [] Varicose veins [x] Non-healing ulcers Pulmonary: [] Uses home oxygen [] Productive  cough[] Hemoptysis [] Wheeze [] COPD [] Asthma Neurologic: [] Dizziness [] Blackouts [] Seizures [] History of stroke [] History of TIA[] Aphasia [] Temporary blindness[] Dysphagia [] Weaknessor numbness in arms [] Weakness or numbnessin legs Musculoskeletal: [x] Arthritis [] Joint swelling [] Joint pain [] Low back pain Hematologic:[] Easy bruising[] Easy bleeding [] Hypercoagulable state [] Anemic [] Hepatitis Gastrointestinal:[] Blood in stool[] Vomiting blood[] Gastroesophageal reflux/heartburn[] Abdominal pain Genitourinary: [] Chronic kidney disease [] Difficulturination [] Frequenturination [] Burning with urination[] Hematuria Skin: [] Rashes [x] Ulcers [x] Wounds Psychological: [] History of anxiety[] History of major depression.    Physical Examination  There were no vitals taken for  this visit. Gen:  WD/WN, NAD Head: [place]/AT, No temporalis wasting. Ear/Nose/Throat: Hearing grossly intact, nares w/o erythema or drainage Eyes: Conjunctiva clear. Sclera non-icteric Neck: Supple.  Trachea midline Pulmonary:  Good air movement, no use of accessory muscles.  Cardiac: Irregular Vascular:  Vessel Right Left  Radial Palpable Palpable                          PT  1+ palpable  1+ palpable  DP  1+ palpable  1+ palpable   Musculoskeletal: M/S 5/5 throughout.  No deformity or atrophy.  Trace bilateral lower extremity edema. Neurologic: Sensation grossly intact in extremities.  Symmetrical.  Speech is fluent.  Psychiatric: Judgment intact, Mood & affect appropriate for pt's clinical situation. Dermatologic: Small pinhole wound just above the left medial ankle       Labs Recent Results (from the past 2160 hour(s))  Vitamin D (25 hydroxy)     Status: None   Collection Time: 08/02/17  9:57 AM  Result Value Ref Range   VITD 33.28 30.00 - 100.00 ng/mL  TSH     Status: None   Collection Time: 08/02/17  9:57 AM  Result Value Ref Range    TSH 2.64 0.35 - 4.50 uIU/mL  Urinalysis, Routine w reflex microscopic     Status: None   Collection Time: 08/02/17  9:57 AM  Result Value Ref Range   Specific Gravity, UA 1.020 1.005 - 1.030   pH, UA 7.0 5.0 - 7.5   Color, UA Yellow Yellow   Appearance Ur Clear Clear   Leukocytes, UA Negative Negative   Protein, UA Negative Negative/Trace   Glucose, UA Negative Negative   Ketones, UA Negative Negative   RBC, UA Negative Negative   Bilirubin, UA Negative Negative   Urobilinogen, Ur 0.2 0.2 - 1.0 mg/dL   Nitrite, UA Negative Negative   Microscopic Examination Comment     Comment: Microscopic not indicated and not performed.  Lipid panel     Status: None   Collection Time: 08/02/17  9:57 AM  Result Value Ref Range   Cholesterol 171 0 - 200 mg/dL    Comment: ATP III Classification       Desirable:  < 200 mg/dL               Borderline High:  200 - 239 mg/dL          High:  > = 240 mg/dL   Triglycerides 134.0 0.0 - 149.0 mg/dL    Comment: Normal:  <150 mg/dLBorderline High:  150 - 199 mg/dL   HDL 47.70 >39.00 mg/dL   VLDL 26.8 0.0 - 40.0 mg/dL   LDL Cholesterol 97 0 - 99 mg/dL   Total CHOL/HDL Ratio 4     Comment:                Men          Women1/2 Average Risk     3.4          3.3Average Risk          5.0          4.42X Average Risk          9.6          7.13X Average Risk          15.0          11.0  NonHDL 123.48     Comment: NOTE:  Non-HDL goal should be 30 mg/dL higher than patient's LDL goal (i.e. LDL goal of < 70 mg/dL, would have non-HDL goal of < 100 mg/dL)  CBC with Differential/Platelet     Status: None   Collection Time: 08/02/17  9:57 AM  Result Value Ref Range   WBC 5.0 4.0 - 10.5 K/uL   RBC 4.36 3.87 - 5.11 Mil/uL   Hemoglobin 13.1 12.0 - 15.0 g/dL   HCT 39.2 36.0 - 46.0 %   MCV 90.1 78.0 - 100.0 fl   MCHC 33.4 30.0 - 36.0 g/dL   RDW 13.8 11.5 - 15.5 %   Platelets 175.0 150.0 - 400.0 K/uL   Neutrophils Relative % 58.9 43.0 - 77.0 %    Lymphocytes Relative 28.3 12.0 - 46.0 %   Monocytes Relative 8.9 3.0 - 12.0 %   Eosinophils Relative 2.8 0.0 - 5.0 %   Basophils Relative 1.1 0.0 - 3.0 %   Neutro Abs 2.9 1.4 - 7.7 K/uL   Lymphs Abs 1.4 0.7 - 4.0 K/uL   Monocytes Absolute 0.4 0.1 - 1.0 K/uL   Eosinophils Absolute 0.1 0.0 - 0.7 K/uL   Basophils Absolute 0.1 0.0 - 0.1 K/uL  Comprehensive metabolic panel     Status: None   Collection Time: 08/02/17  9:57 AM  Result Value Ref Range   Sodium 141 135 - 145 mEq/L   Potassium 4.1 3.5 - 5.1 mEq/L   Chloride 104 96 - 112 mEq/L   CO2 28 19 - 32 mEq/L   Glucose, Bld 95 70 - 99 mg/dL   BUN 21 6 - 23 mg/dL   Creatinine, Ser 0.77 0.40 - 1.20 mg/dL   Total Bilirubin 0.4 0.2 - 1.2 mg/dL   Alkaline Phosphatase 46 39 - 117 U/L   AST 21 0 - 37 U/L   ALT 17 0 - 35 U/L   Total Protein 7.2 6.0 - 8.3 g/dL   Albumin 4.1 3.5 - 5.2 g/dL   Calcium 9.5 8.4 - 10.5 mg/dL   GFR 74.47 >60.00 mL/min    Radiology No results found.  Assessment/Plan Hypertension blood pressure control important in reducing the progression of atherosclerotic disease. On appropriate oral medications.  Atherosclerosis of native arteries of the extremities with ulceration ([place]) She did have noninvasive arterial studies which showed monophasic blood flow bilaterally although the ABIs themselves were technically normal.  I suspect there is some degree of medial calcification falsely elevating the ABIs monophasic flow. Her wound is significantly improved, and I think given her age and the dramatic improvement thus far continued local wound care with an Unna boot would be reasonable.  A 3 layer Unna boot was placed today.  She is scheduled for a reflux study in the future.  We will continue weekly Unna boots and I will reassess her in 1 month.  Ulcer of left lower extremity ([place]) Her wound is improved,. continued local wound care with an Unna boot would be reasonable.  A 3 layer Unna boot was placed today.  She is  scheduled for a reflux study in the future.  We will continue weekly Unna boots and I will reassess her in 1 month.      Leotis Pain, MD  10/19/2017 3:03 PM    This note was created with Dragon medical transcription system.  Any errors from dictation are purely unintentional

## 2017-10-22 ENCOUNTER — Other Ambulatory Visit: Payer: Self-pay | Admitting: Internal Medicine

## 2017-10-26 ENCOUNTER — Encounter (INDEPENDENT_AMBULATORY_CARE_PROVIDER_SITE_OTHER): Payer: Medicare Other

## 2017-10-27 ENCOUNTER — Encounter (INDEPENDENT_AMBULATORY_CARE_PROVIDER_SITE_OTHER): Payer: Self-pay | Admitting: Nurse Practitioner

## 2017-10-27 ENCOUNTER — Ambulatory Visit (INDEPENDENT_AMBULATORY_CARE_PROVIDER_SITE_OTHER): Payer: Medicare Other | Admitting: Nurse Practitioner

## 2017-10-27 VITALS — BP 136/67 | HR 86 | Resp 12 | Ht 66.0 in | Wt 153.0 lb

## 2017-10-27 DIAGNOSIS — L97921 Non-pressure chronic ulcer of unspecified part of left lower leg limited to breakdown of skin: Secondary | ICD-10-CM | POA: Diagnosis not present

## 2017-10-27 NOTE — Progress Notes (Signed)
History of Present Illness  There is no documented history at this time  Assessments & Plan   There are no diagnoses linked to this encounter.    Additional instructions  Subjective:  Patient presents with venous ulcer of the Left lower extremity.    Procedure:  3 layer unna wrap was placed Left lower extremity.   Plan:   Follow up in one week.  

## 2017-11-02 ENCOUNTER — Encounter (INDEPENDENT_AMBULATORY_CARE_PROVIDER_SITE_OTHER): Payer: Medicare Other

## 2017-11-03 ENCOUNTER — Ambulatory Visit (INDEPENDENT_AMBULATORY_CARE_PROVIDER_SITE_OTHER): Payer: Medicare Other | Admitting: Nurse Practitioner

## 2017-11-03 ENCOUNTER — Encounter (INDEPENDENT_AMBULATORY_CARE_PROVIDER_SITE_OTHER): Payer: Self-pay

## 2017-11-03 VITALS — BP 142/71 | HR 74 | Resp 14 | Ht 66.0 in | Wt 151.8 lb

## 2017-11-03 DIAGNOSIS — L97921 Non-pressure chronic ulcer of unspecified part of left lower leg limited to breakdown of skin: Secondary | ICD-10-CM

## 2017-11-03 NOTE — Progress Notes (Signed)
History of Present Illness  There is no documented history at this time  Assessments & Plan   There are no diagnoses linked to this encounter.    Additional instructions  Subjective:  Patient presents with venous ulcer of the Left lower extremity.    Procedure:  3 layer unna wrap was placed Left lower extremity.   Plan:   Follow up in one week.  

## 2017-11-09 ENCOUNTER — Encounter (INDEPENDENT_AMBULATORY_CARE_PROVIDER_SITE_OTHER): Payer: Medicare Other

## 2017-11-10 ENCOUNTER — Ambulatory Visit (INDEPENDENT_AMBULATORY_CARE_PROVIDER_SITE_OTHER): Payer: Medicare Other | Admitting: Nurse Practitioner

## 2017-11-10 ENCOUNTER — Encounter (INDEPENDENT_AMBULATORY_CARE_PROVIDER_SITE_OTHER): Payer: Self-pay

## 2017-11-10 VITALS — BP 160/68 | HR 70 | Resp 12 | Ht 64.0 in | Wt 152.0 lb

## 2017-11-10 DIAGNOSIS — N3941 Urge incontinence: Secondary | ICD-10-CM | POA: Diagnosis not present

## 2017-11-10 DIAGNOSIS — L97921 Non-pressure chronic ulcer of unspecified part of left lower leg limited to breakdown of skin: Secondary | ICD-10-CM

## 2017-11-10 NOTE — Progress Notes (Signed)
History of Present Illness  There is no documented history at this time  Assessments & Plan   There are no diagnoses linked to this encounter.    Additional instructions  Subjective:  Patient presents with venous ulcer of the Left lower extremity.    Procedure:  3 layer unna wrap was placed Left lower extremity.   Plan:   Follow up in one week.  

## 2017-11-16 ENCOUNTER — Telehealth: Payer: Self-pay | Admitting: Internal Medicine

## 2017-11-16 ENCOUNTER — Encounter (INDEPENDENT_AMBULATORY_CARE_PROVIDER_SITE_OTHER): Payer: Self-pay | Admitting: Vascular Surgery

## 2017-11-16 ENCOUNTER — Ambulatory Visit (INDEPENDENT_AMBULATORY_CARE_PROVIDER_SITE_OTHER): Payer: Medicare Other | Admitting: Vascular Surgery

## 2017-11-16 VITALS — BP 148/68 | HR 67 | Resp 14 | Ht 64.5 in | Wt 138.0 lb

## 2017-11-16 DIAGNOSIS — K5909 Other constipation: Secondary | ICD-10-CM

## 2017-11-16 DIAGNOSIS — L97921 Non-pressure chronic ulcer of unspecified part of left lower leg limited to breakdown of skin: Secondary | ICD-10-CM

## 2017-11-16 DIAGNOSIS — I7025 Atherosclerosis of native arteries of other extremities with ulceration: Secondary | ICD-10-CM

## 2017-11-16 NOTE — Telephone Encounter (Signed)
Copied from [place] [id number]. Topic: Quick Communication - Rx Refill/Question >> Nov 16, 2017  3:54 PM Judyann Munson wrote: Medication: linaclotide Rolan Lipa) 145 MCG CAPS capsule   Has the patient contacted their pharmacy? No   Preferred Pharmacy (with phone number or street name): [id number], [place] [phone] (Phone) [phone] (Fax)  Agent: Please be advised that RX refills may take up to 3 business days. We ask that you follow-up with your pharmacy.

## 2017-11-16 NOTE — Progress Notes (Signed)
MRN : [id number]  Kristine Jacobson is a 84 y.o. ([birth year]) female who presents with chief complaint of  Chief Complaint  Patient presents with  . Follow-up    Unna boot check  .  History of Present Illness: Patient returns today in follow up of her medial left ankle ulceration.  This has now healed with Unna boot therapy.  Her swelling is very mild at this point.  No fevers or chills.  She is scheduled to come back for a venous reflux study in several weeks and has previously had her arteries assessed which showed some disease.  The arterial disease was not severe, and we manage this wound with conservative measures are not revascularization and this has healed.  Current Outpatient Medications  Medication Sig Dispense Refill  . acetaminophen (TYLENOL) 500 MG tablet Take 1,000 mg by mouth 2 (two) times daily as needed (pain).     Marland Kitchen amLODipine (NORVASC) 5 MG tablet Take 1 tablet (5 mg total) by mouth daily.    Marland Kitchen aspirin EC 81 MG tablet Take 81 mg by mouth at bedtime.    . bisacodyl (BISACODYL) 5 MG EC tablet Take 5 mg by mouth 2 (two) times a week. Take 1 tablet (5 mg) by mouth on Sunday and Wednesday nights (the night before Linzess dose)    . Calcium Carb-Cholecalciferol (CALCIUM 600 + D PO) Take 600 mg by mouth 2 (two) times daily.    . Calcium Carbonate-Vitamin D (CALTRATE 600+D) 600-400 MG-UNIT tablet Take by mouth.    . carvedilol (COREG) 3.125 MG tablet Take 3.125 mg by mouth 2 (two) times daily with a meal.    . doxycycline (VIBRAMYCIN) 100 MG capsule Take 100 mg by mouth daily with lunch. Continuous course for UTI prevention    . furosemide (LASIX) 20 MG tablet Take 1 tablet (20 mg total) by mouth every other day as needed. Leg edema prn 10 tablet 0  . gabapentin (NEURONTIN) 100 MG capsule TAKE TWO CAPSULES BY MOUTH THREE TIMES DAILY 540 capsule 0  . ibuprofen (ADVIL,MOTRIN) 600 MG tablet take 1 tablet by mouth every 6 hours if needed with food DISCONTINUE ORDER WHEN COMPLETED    .  linaclotide (LINZESS) 145 MCG CAPS capsule Take 1 capsule (145 mcg total) by mouth 2 (two) times a week. Take 1 capsule (145 mcg) by mouth on Mondays and Thursdays at 2pm 30 capsule 2  . loratadine (CLARITIN) 10 MG tablet Take 10 mg by mouth daily.    Marland Kitchen losartan-hydrochlorothiazide (HYZAAR) 100-25 MG tablet Take 1 tablet by mouth daily. 90 tablet 3  . Multiple Vitamin (MULTIVITAMIN WITH MINERALS) TABS tablet Take 1 tablet by mouth daily. Centrum Silver    . Multiple Vitamins-Minerals (PRESERVISION AREDS 2) CAPS Take 1 capsule by mouth 2 (two) times daily.    . mupirocin ointment (BACTROBAN) 2 % Apply 1 application topically 2 (two) times daily. Left lower leg 30 g 0  . Propylene Glycol (SYSTANE BALANCE OP) Place 1 drop into both eyes 3 (three) times daily as needed (dry eyes).    Marland Kitchen rOPINIRole (REQUIP) 0.5 MG tablet TAKE 1 TABLET BY MOUTH IN THE MORNING, 1 TABLET BY MOUTH AT LUNCH, AND 1 TABLET BY MOUTH IN THE EVENING. 270 tablet 0  . traMADol (ULTRAM) 50 MG tablet Take 1 tablet (50 mg total) by mouth every 6 (six) hours as needed for moderate pain. (Patient taking differently: Take 50 mg by mouth daily as needed for moderate pain. ) 16 tablet  0   No current facility-administered medications for this visit.     Past Medical History:  Diagnosis Date  . Allergy    seasonal   . Arthritis    shoulders, hands  . Cancer ([place])    [facility] face-Dr. Evorn Gong   . Chronic neck pain    2/2 bulging discs   . HBP (high blood pressure)   . Hearing aid worn    bilateral  . Macular degeneration    dx'ed 2015 dry now left wet and right mild wet [facility] Dr. Michelene Heady  . Macular degeneration, age related   . Osteoporosis   . Overactive bladder   . Restless leg   . Spinal stenosis   . Urinary incontinence    s/p monthly PTNS with Dr. Jacqlyn Larsen, no help with overactive bladder with meds nor Botox   . UTI (urinary tract infection)   . Vertigo    no episodes for several years  . Wears dentures    partial  upper and lower    Past Surgical History:  Procedure Laterality Date  . ABDOMINAL HYSTERECTOMY     1967  . APPENDECTOMY     1967  . BLADDER SURGERY     bladder suspension; 2005, 2011-Dr. Cope  . BREAST EXCISIONAL BIOPSY Right 1970   neg  . BROW LIFT Bilateral 12/01/2016   Procedure: BLEPHAROPLASTY upper eyelid with excess skin;  Surgeon: Karle Starch, MD;  Location: [facility];  Service: Ophthalmology;  Laterality: Bilateral;  MAC  . CARPAL TUNNEL RELEASE     right 2016   . Esophageal dilitation    . eyelid surgery     2018  . FRACTURE SURGERY    . HIP SURGERY     left 2016   . INTRAMEDULLARY (IM) NAIL INTERTROCHANTERIC Left 02/27/2015   Procedure: INTRAMEDULLARY (IM) NAIL INTERTROCHANTRIC;  Surgeon: Dereck Leep, MD;  Location: [facility];  Service: Orthopedics;  Laterality: Left;  . PTOSIS REPAIR Bilateral 12/01/2016   Procedure: PTOSIS REPAIR  repair resect ex;  Surgeon: Karle Starch, MD;  Location: [facility];  Service: Ophthalmology;  Laterality: Bilateral;  . TONSILLECTOMY      Family History  Problem Relation Age of Onset  . Diabetes Mother   . Arthritis Mother   . Stroke Mother   . Heart disease Father   . Diabetes Sister   . Cancer Brother    lung 2/2 asbestos   . Diabetes Daughter   . Diabetes Brother   . Diabetes Brother   . Breast cancer Neg Hx      Social History Social History       Tobacco Use  . Smoking status: Never Smoker  . Smokeless tobacco: Never Used  Substance Use Topics  . Alcohol use: No  . Drug use: No  Lives in nearby facility      Allergies  Allergen Reactions  . Codeine Rash       REVIEW OF SYSTEMS(Negative unless checked)  Constitutional: _0 Weight loss_1 Fever_2 Chills Cardiac:_3 Chest pain_4 Chest pressure_5 Palpitations _6 Shortness of breath when laying flat _7 Shortness of breath at rest _8 Shortness of breath with exertion. Vascular: _9 Pain in legs  with walking_10 Pain in legsat rest_11 Pain in legs when laying flat _12 Claudication _13 Pain in feet when walking _14 Pain in feet at rest _15 Pain in feet when laying flat _16 History of DVT _17 Phlebitis _18 Swelling in legs _19 Varicose veins _20 Non-healing ulcers Pulmonary: _21 Uses home oxygen _22 Productive cough_23 Hemoptysis _24 Wheeze _25 COPD _26 Asthma Neurologic: _27 Dizziness _28 Blackouts _29 Seizures _30 History of stroke _31 History of TIA_32 Aphasia _33 Temporary blindness_34 Dysphagia _35 [employer]  numbness in arms _0 Weakness or numbnessin legs Musculoskeletal: _1 Arthritis _2 Joint swelling _3 Joint pain _4 Low back pain Hematologic:_5 Easy bruising_6 Easy bleeding _7 Hypercoagulable state _8 Anemic _9 Hepatitis Gastrointestinal:_10 Blood in stool_11 Vomiting blood_12 Gastroesophageal reflux/heartburn_13 Abdominal pain Genitourinary: _14 Chronic kidney disease _15 Difficulturination _16 Frequenturination _17 Burning with urination_18 Hematuria Skin: _19 Rashes _20 Ulcers _21 Wounds Psychological: _22 History of anxiety_23 History of major depression.    Physical Examination  BP (!) 148/68 (BP Location: Right Arm, Patient Position: Sitting)   Pulse 67   Resp 14   Ht 5' 4.5" (1.638 m)   Wt 138 lb (62.6 kg)   BMI 23.32 kg/m  Gen:  WD/WN, NAD. Appears much younger than stated age. Head: No Name/AT, No temporalis wasting. Ear/Nose/Throat: Hearing grossly intact, nares w/o erythema or drainage Eyes: Conjunctiva clear. Sclera non-icteric Neck: Supple.  Trachea midline Pulmonary:  Good air movement, no use of accessory muscles.  Cardiac: RRR, no JVD Vascular:  Vessel Right Left  Radial Palpable Palpable                          PT 1+ Palpable 1+ Palpable  DP Palpable 1+ Palpable   Gastrointestinal: soft, non-tender/non-distended. Musculoskeletal: M/S 5/5 throughout.  No deformity or atrophy. Diffuse varicosities bilaterally. Previous left ankle  ulcer has now healed.  Mild BLE edema. Neurologic: Sensation grossly intact in extremities.  Symmetrical.  Speech is fluent.  Psychiatric: Judgment intact, Mood & affect appropriate for pt's clinical situation. Dermatologic: No rashes or ulcers noted.  No cellulitis or open wounds.       Labs No results found for this or any previous visit (from the past 2160 hour(s)).  Radiology No results found.  Assessment/Plan Hypertension blood pressure control important in reducing the progression of atherosclerotic disease. On appropriate oral medications.  Ulcer of left lower extremity ([place]) Now healed. Will come out of UNNA boots today and go to compression stockings alone  Atherosclerosis of native arteries of the extremities with ulceration ([place]) We have now gotten her wound to heal with several weeks of Unna boots.  Her swelling is also controlled.  We are going to come out of the Unna boots today and just go to compression stockings.  She is scheduled in the next several weeks to come back for a venous reflux study and we will see her back at that time.    Leotis Pain, MD  11/16/2017 3:08 PM    This note was created with Dragon medical transcription system.  Any errors from dictation are purely unintentional

## 2017-11-16 NOTE — Assessment & Plan Note (Signed)
We have now gotten her wound to heal with several weeks of Unna boots.  Her swelling is also controlled.  We are going to come out of the Unna boots today and just go to compression stockings.  She is scheduled in the next several weeks to come back for a venous reflux study and we will see her back at that time.

## 2017-11-16 NOTE — Assessment & Plan Note (Signed)
Now healed. Will come out of UNNA boots today and go to compression stockings alone

## 2017-11-16 NOTE — Telephone Encounter (Signed)
Please advise 

## 2017-11-17 MED ORDER — LINACLOTIDE 145 MCG PO CAPS: 145.0000 ug | ORAL_CAPSULE | ORAL | 2 refills | Status: DC

## 2017-11-24 DIAGNOSIS — Z7982 Long term (current) use of aspirin: Secondary | ICD-10-CM | POA: Diagnosis not present

## 2017-11-24 DIAGNOSIS — J302 Other seasonal allergic rhinitis: Secondary | ICD-10-CM | POA: Diagnosis not present

## 2017-11-24 DIAGNOSIS — K219 Gastro-esophageal reflux disease without esophagitis: Secondary | ICD-10-CM | POA: Diagnosis not present

## 2017-11-24 DIAGNOSIS — N3946 Mixed incontinence: Secondary | ICD-10-CM | POA: Diagnosis not present

## 2017-11-24 DIAGNOSIS — G2581 Restless legs syndrome: Secondary | ICD-10-CM | POA: Diagnosis not present

## 2017-11-24 DIAGNOSIS — R109 Unspecified abdominal pain: Secondary | ICD-10-CM | POA: Diagnosis not present

## 2017-11-24 DIAGNOSIS — R067 Sneezing: Secondary | ICD-10-CM | POA: Diagnosis not present

## 2017-11-24 DIAGNOSIS — M1990 Unspecified osteoarthritis, unspecified site: Secondary | ICD-10-CM | POA: Diagnosis not present

## 2017-11-24 DIAGNOSIS — I10 Essential (primary) hypertension: Secondary | ICD-10-CM | POA: Diagnosis not present

## 2017-11-24 DIAGNOSIS — R112 Nausea with vomiting, unspecified: Secondary | ICD-10-CM | POA: Diagnosis not present

## 2017-11-24 DIAGNOSIS — M810 Age-related osteoporosis without current pathological fracture: Secondary | ICD-10-CM | POA: Diagnosis not present

## 2017-11-24 DIAGNOSIS — C4490 Unspecified malignant neoplasm of skin, unspecified: Secondary | ICD-10-CM | POA: Diagnosis not present

## 2017-11-24 DIAGNOSIS — Z885 Allergy status to narcotic agent status: Secondary | ICD-10-CM | POA: Diagnosis not present

## 2017-11-24 DIAGNOSIS — R1084 Generalized abdominal pain: Secondary | ICD-10-CM | POA: Diagnosis not present

## 2017-11-24 DIAGNOSIS — Z79899 Other long term (current) drug therapy: Secondary | ICD-10-CM | POA: Diagnosis not present

## 2017-11-24 DIAGNOSIS — M791 Myalgia, unspecified site: Secondary | ICD-10-CM | POA: Diagnosis not present

## 2017-12-06 ENCOUNTER — Ambulatory Visit: Payer: Medicare Other | Admitting: Internal Medicine

## 2017-12-14 ENCOUNTER — Telehealth: Payer: Self-pay | Admitting: Internal Medicine

## 2017-12-14 NOTE — Telephone Encounter (Signed)
Copied from [place] [id number]. Topic: Quick Communication - Rx Refill/Question >> Dec 14, 2017  3:57 PM Bea Graff, NT wrote: Medication: rOPINIRole (REQUIP) 0.5 MG tablet   Has the patient contacted their pharmacy? Yes.   (Agent: If no, request that the patient contact the pharmacy for the refill.) (Agent: If yes, when and what did the pharmacy advise?)  Preferred Pharmacy (with phone number or street name): [facility] #[id number], [place] [phone] (Phone) [phone] (Fax)    Agent: Please be advised that RX refills may take up to 3 business days. We ask that you follow-up with your pharmacy.

## 2017-12-15 NOTE — Telephone Encounter (Signed)
Not prescribed under this provider.   Prescribed by Leretha Pol, NP.

## 2017-12-20 ENCOUNTER — Other Ambulatory Visit: Payer: Self-pay | Admitting: Internal Medicine

## 2017-12-20 DIAGNOSIS — G2581 Restless legs syndrome: Secondary | ICD-10-CM

## 2017-12-20 MED ORDER — ROPINIROLE HCL 0.5 MG PO TABS: ORAL_TABLET | ORAL | 3 refills | Status: DC

## 2017-12-20 NOTE — Telephone Encounter (Signed)
rx request 

## 2017-12-20 NOTE — Telephone Encounter (Signed)
Patient takes the script for restless leg syndrome and needs Dr Aundra Dubin to take over the script. No pills left.

## 2017-12-22 DIAGNOSIS — Z08 Encounter for follow-up examination after completed treatment for malignant neoplasm: Secondary | ICD-10-CM | POA: Diagnosis not present

## 2017-12-22 DIAGNOSIS — L821 Other seborrheic keratosis: Secondary | ICD-10-CM | POA: Diagnosis not present

## 2017-12-22 DIAGNOSIS — L820 Inflamed seborrheic keratosis: Secondary | ICD-10-CM | POA: Diagnosis not present

## 2017-12-22 DIAGNOSIS — Z85828 Personal history of other malignant neoplasm of skin: Secondary | ICD-10-CM | POA: Diagnosis not present

## 2017-12-22 DIAGNOSIS — L570 Actinic keratosis: Secondary | ICD-10-CM | POA: Diagnosis not present

## 2017-12-23 ENCOUNTER — Other Ambulatory Visit: Payer: Self-pay | Admitting: Internal Medicine

## 2017-12-23 DIAGNOSIS — G2581 Restless legs syndrome: Secondary | ICD-10-CM

## 2017-12-23 MED ORDER — ROPINIROLE HCL 0.5 MG PO TABS: ORAL_TABLET | ORAL | 3 refills | Status: DC

## 2017-12-24 ENCOUNTER — Ambulatory Visit: Payer: Medicare Other | Admitting: Internal Medicine

## 2017-12-24 ENCOUNTER — Encounter: Payer: Self-pay | Admitting: Internal Medicine

## 2017-12-24 VITALS — BP 134/68 | HR 71 | Temp 98.1°F | Resp 16 | Ht 64.5 in | Wt 150.0 lb

## 2017-12-24 DIAGNOSIS — Z23 Encounter for immunization: Secondary | ICD-10-CM

## 2017-12-24 DIAGNOSIS — M79606 Pain in leg, unspecified: Secondary | ICD-10-CM

## 2017-12-24 DIAGNOSIS — I10 Essential (primary) hypertension: Secondary | ICD-10-CM

## 2017-12-24 DIAGNOSIS — M810 Age-related osteoporosis without current pathological fracture: Secondary | ICD-10-CM

## 2017-12-24 DIAGNOSIS — N3281 Overactive bladder: Secondary | ICD-10-CM

## 2017-12-24 DIAGNOSIS — R142 Eructation: Secondary | ICD-10-CM

## 2017-12-24 DIAGNOSIS — Z85828 Personal history of other malignant neoplasm of skin: Secondary | ICD-10-CM

## 2017-12-24 MED ORDER — CARVEDILOL 6.25 MG PO TABS: 6.2500 mg | ORAL_TABLET | Freq: Two times a day (BID) | ORAL | 3 refills | Status: DC

## 2017-12-24 NOTE — Patient Instructions (Addendum)
Cranberry with D mannose daily supplement   Shingles vaccine shingrix 2 doses  Strontrium 500 mg 1x per day think about for bone health   Try Pepcid AC other the counter if this does not help Prilosec or nexium   CT ab/pelvis No definite acute abnormality within the abdomen or pelvis. Similar to prior, proximal small bowel appears mildly dilated although this tapers smoothly distally without evidence of focal obstruction.  - Question of bladder wall thickening. Consider correlation for cystitis using urinalysis.  - Nonspecific small volume free fluid within the pelvis.  Result Narrative  EXAM: CT ABDOMEN PELVIS W CONTRAST DATE: 11/24/2017 1:19 PM ACCESSION: [id number] UN DICTATED: 11/24/2017 1:22 PM INTERPRETATION LOCATION: [facility]  CLINICAL INDICATION: ABDOMINAL PAIN, (specify site in comments) -diffuse, concern for SBO  COMPARISON: CT of the abdomen and pelvis dated 01/25/2017.  TECHNIQUE: A spiral CT scan of the abdomen and pelvis was obtained with IV contrast from the lung bases through the pubic symphysis. Images were reconstructed in the axial plane. Coronal and sagittal reformatted images were also provided for further evaluation.  FINDINGS:   Atelectasis is present at the bilateral lung bases.  Unchanged subcentimeter low-attenuation lesion in hepatic segment 4, too small to characterize with CT. The gallbladder is unremarkable. No biliary dilatation. Normal appearance of the spleen, pancreas, and adrenal glands.  No suspicious renal lesions. No renal calculi are seen. No collecting system dilatation. There is mild circumferential wall thickening of the bladder, although the bladder is underdistended.  Extensive colonic diverticulosis without inflammatory change. Proximal small bowel loops appear mildly dilated bilateral contrast material although tapers distally without focal transition point.  There is trace free fluid within the pelvis, slightly greater than on  the comparison exam. No free air within the abdomen or pelvis.  Normal caliber of the abdominal aorta. There is moderate calcified plaque throughout the abdominal aorta and major branch vessels. Unremarkable IVC. The portal veins and hepatic veins are patent.  No enlarged lymph nodes in the abdomen or pelvis.  No aggressive osseous lesions. Status post left hip fixation with degenerative changes. Multilevel degenerative changes of the spine. Unchanged L5 pars defects. Unremarkable body wall and musculature.  Other Result Information      Indigestion Indigestion is a feeling of pain, discomfort, burning, or fullness in the upper part of your abdomen. It can come and go. It may occur frequently or rarely. Indigestion tends to occur while you are eating or right after you have finished eating. It may be worse at night and while bending over or lying down. Follow these instructions at home: Take these actions to decrease your pain or discomfort and to help avoid complications. Diet  Follow a diet as recommended by your health care provider. This may involve avoiding foods and drinks such as: ? Coffee and tea (with or without caffeine). ? Drinks that contain alcohol. ? Energy drinks and sports drinks. ? Carbonated drinks or sodas. ? Chocolate and cocoa. ? Peppermint and mint flavorings. ? Garlic and onions. ? Horseradish. ? Spicy and acidic foods, including peppers, chili powder, curry powder, vinegar, hot sauces, and barbecue sauce. ? Citrus fruit juices and citrus fruits, such as oranges, lemons, and limes. ? Tomato-based foods, such as red sauce, chili, salsa, and pizza with red sauce. ? Fried and fatty foods, such as donuts, french fries, potato chips, and high-fat dressings. ? High-fat meats, such as hot dogs and fatty cuts of red and white meats, such as rib eye steak, sausage, ham, and  bacon. ? High-fat dairy items, such as whole milk, butter, and cream cheese.  Eat small,  frequent meals instead of large meals.  Avoid drinking large amounts of liquid with your meals.  Avoid eating meals during the 2-3 hours before bedtime.  Avoid lying down right after you eat.  Do not exercise right after you eat. General instructions  Pay attention to any changes in your symptoms.  Take over-the-counter and prescription medicines only as told by your health care provider. Do not take aspirin, ibuprofen, or other NSAIDs unless your health care provider told you to do so.  Do not use any tobacco products, including cigarettes, chewing tobacco, and e-cigarettes. If you need help quitting, ask your health care provider.  Wear loose-fitting clothing. Do not wear anything tight around your waist that causes pressure on your abdomen.  Raise (elevate) the head of your bed about 6 inches (15 cm).  Try to reduce your stress, such as with yoga or meditation. If you need help reducing stress, ask your health care provider.  If you are overweight, reduce your weight to an amount that is healthy for you. Ask your health care provider for guidance about a safe weight loss goal.  Keep all follow-up visits as told by your health care provider. This is important. Contact a health care provider if:  You have new symptoms.  You have unexplained weight loss.  You have difficulty swallowing, or it hurts to swallow.  Your symptoms do not improve with treatment.  Your symptoms last for more than two days.  You have a fever.  You vomit. Get help right away if:  You have pain in your arms, neck, jaw, teeth, or back.  You feel sweaty, dizzy, or light-headed.  You faint.  You have chest pain or shortness of breath.  You cannot stop vomiting, or you vomit blood.  Your stool is bloody or black.  You have severe pain in your abdomen. This information is not intended to replace advice given to you by your health care provider. Make sure you discuss any questions you have with  your health care provider. Document Released: 04/09/2004 Document Revised: 08/08/2015 Document Reviewed: 06/27/2014 Elsevier Interactive Patient Education  2018 [place] Zoster (Shingles) Vaccine, RZV: What You Need to Know 1. Why get vaccinated? Shingles (also called herpes zoster, or just zoster) is a painful skin rash, often with blisters. Shingles is caused by the varicella zoster virus, the same virus that causes chickenpox. After you have chickenpox, the virus stays in your body and can cause shingles later in life. You can't catch shingles from another person. However, a person who has never had chickenpox (or chickenpox vaccine) could get chickenpox from someone with shingles. A shingles rash usually appears on one side of the face or body and heals within 2 to 4 weeks. Its main symptom is pain, which can be severe. Other symptoms can include fever, headache, chills and upset stomach. Very rarely, a shingles infection can lead to pneumonia, hearing problems, blindness, brain inflammation (encephalitis), or death. For about 1 person in 5, severe pain can continue even long after the rash has cleared up. This long-lasting pain is called post-herpetic neuralgia (PHN). Shingles is far more common in people 36 years of age and older than in younger people, and the risk increases with age. It is also more common in people whose immune system is weakened because of a disease such as cancer, or by drugs such as  steroids or chemotherapy. At least 1 million people a year in the [place] get shingles. 2. Shingles vaccine (recombinant) Recombinant shingles vaccine was approved by FDA in 2017 for the prevention of shingles. In clinical trials, it was more than 90% effective in preventing shingles. It can also reduce the likelihood of PHN. Two doses, 2 to 6 months apart, are recommended for adults 31 and older. This vaccine is also recommended for people who have already gotten  the live shingles vaccine (Zostavax). There is no live virus in this vaccine. 3. Some people should not get this vaccine Tell your vaccine provider if you:  Have any severe, life-threatening allergies. A person who has ever had a life-threatening allergic reaction after a dose of recombinant shingles vaccine, or has a severe allergy to any component of this vaccine, may be advised not to be vaccinated. Ask your health care provider if you want information about vaccine components.  Are pregnant or breastfeeding. There is not much information about use of recombinant shingles vaccine in pregnant or nursing women. Your healthcare provider might recommend delaying vaccination.  Are not feeling well. If you have a mild illness, such as a cold, you can probably get the vaccine today. If you are moderately or severely ill, you should probably wait until you recover. Your doctor can advise you.  4. Risks of a vaccine reaction With any medicine, including vaccines, there is a chance of reactions. After recombinant shingles vaccination, a person might experience:  Pain, redness, soreness, or swelling at the site of the injection  Headache, muscle aches, fever, shivering, fatigue  In clinical trials, most people got a sore arm with mild or moderate pain after vaccination, and some also had redness and swelling where they got the shot. Some people felt tired, had muscle pain, a headache, shivering, fever, stomach pain, or nausea. About 1 out of 6 people who got recombinant zoster vaccine experienced side effects that prevented them from doing regular activities. Symptoms went away on their own in about 2 to 3 days. Side effects were more common in younger people. You should still get the second dose of recombinant zoster vaccine even if you had one of these reactions after the first dose. Other things that could happen after this vaccine:  People sometimes faint after medical procedures, including  vaccination. Sitting or lying down for about 15 minutes can help prevent fainting and injuries caused by a fall. Tell your provider if you feel dizzy or have vision changes or ringing in the ears.  Some people get shoulder pain that can be more severe and longer-lasting than routine soreness that can follow injections. This happens very rarely.  Any medication can cause a severe allergic reaction. Such reactions to a vaccine are estimated at about 1 in a million doses, and would happen within a few minutes to a few hours after the vaccination. As with any medicine, there is a very remote chance of a vaccine causing a serious injury or death. The safety of vaccines is always being monitored. For more information, visit: http://www.aguilar.org/ 5. What if there is a serious problem? What should I look for?  Look for anything that concerns you, such as signs of a severe allergic reaction, very high fever, or unusual behavior. Signs of a severe allergic reaction can include hives, swelling of the face and throat, difficulty breathing, a fast heartbeat, dizziness, and weakness. These would usually start a few minutes to a few hours after the vaccination. What should  I do?  If you think it is a severe allergic reaction or other emergency that can't wait, call 9-1-1 and get to the nearest hospital. Otherwise, call your health care provider. Afterward, the reaction should be reported to the Vaccine Adverse Event Reporting System (VAERS). Your doctor should file this report, or you can do it yourself through the VAERS web site atwww.vaers.https://www.bray.com/ by calling 281-230-6153. VAERS does not give medical advice. 6. How can I learn more?  Ask your healthcare provider. He or she can give you the vaccine package insert or suggest other sources of information.  Call your local or state health department.  Contact the Centers for Disease Control and Prevention (CDC): ? Call 513-235-9958 (1-800-CDC-INFO)  or ? Visit the CDC's website at http://hunter.com/ CDC Vaccine Information Statement (VIS) Recombinant Zoster Vaccine (04/27/2016) This information is not intended to replace advice given to you by your health care provider. Make sure you discuss any questions you have with your health care provider. Document Released: 05/12/2016 Document Revised: 05/12/2016 Document Reviewed: 05/12/2016 Elsevier Interactive Patient Education  2018 [place]

## 2017-12-24 NOTE — Progress Notes (Signed)
Chief Complaint  Patient presents with  . Hypertension    4 month follow up  . Flu Vaccine   F/u with daughter  1. HTN variable at home sbp >140s most of the time on norvasc 5 mg qd, coreg 3.125 mg bid, hyzaar 100-25 mg qd   2. ED vist 11/24/17 for UTI culture with mixed vaginal flora given ivf and keflex qid x 10 days sx's improved during this time she stopped daily doxycycline which is given to her by urology for UTIS Reviewed labs 11/24/17 BUN 30, AST 45 elevated, hgb 12.5  She also had CT ab/pelvis due to c/w bowel obstruction but results negative and reviewed with pt and daughter today  FINDINGS:   Atelectasis is present at the bilateral lung bases.  Unchanged subcentimeter low-attenuation lesion in hepatic segment 4, too small to characterize with CT. The gallbladder is unremarkable. No biliary dilatation. Normal appearance of the spleen, pancreas, and adrenal glands.  No suspicious renal lesions. No renal calculi are seen. No collecting system dilatation. There is mild circumferential wall thickening of the bladder, although the bladder is underdistended.  Extensive colonic diverticulosis without inflammatory change. Proximal small bowel loops appear mildly dilated bilateral contrast material although tapers distally without focal transition point.  There is trace free fluid within the pelvis, slightly greater than on the comparison exam. No free air within the abdomen or pelvis.  Normal caliber of the abdominal aorta. There is moderate calcified plaque throughout the abdominal aorta and major branch vessels. Unremarkable IVC. The portal veins and hepatic veins are patent.  No enlarged lymph nodes in the abdomen or pelvis.  No aggressive osseous lesions. Status post left hip fixation with degenerative changes. Multilevel degenerative changes of the spine. Unchanged L5 pars defects. Unremarkable body wall and musculature.   IMPRESSION:  - No definite acute abnormality within the  abdomen or pelvis. Similar to prior, proximal small bowel appears mildly dilated although this tapers smoothly distally without evidence of focal obstruction.  - Question of bladder wall thickening. Consider correlation for cystitis using urinalysis.  - Nonspecific small volume free fluid within the pelvis.  3. C/o belching on and off after food  4. Osteoporosis and penia she was on fosamax x years she does not remember how long no h/o recent falls she does take daily calcium and vitamin D  5. Chronic leg pain she will have f/u vascular on 01/2018  6. Saw dermatology Dr. Evorn Gong 12/22/17 had LN2 and bx to left hand possibly skin cancer will f/u   Review of Systems  Constitutional: Negative for weight loss.  HENT: Negative for hearing loss.   Eyes: Negative for blurred vision.  Respiratory: Negative for shortness of breath.   Cardiovascular: Negative for chest pain.  Gastrointestinal:       +belching    Genitourinary: Negative for dysuria.  Musculoskeletal: Positive for joint pain. Negative for falls.  Skin: Negative for rash.  Neurological: Negative for headaches.  Psychiatric/Behavioral: Negative for depression and memory loss.   Past Medical History:  Diagnosis Date  . Allergy    seasonal   . Arthritis    shoulders, hands  . Cancer ([place])    [place] face-Dr. Evorn Gong   . Chronic neck pain    2/2 bulging discs   . HBP (high blood pressure)   . Hearing aid worn    bilateral  . Macular degeneration    dx'ed 2015 dry now left wet and right mild wet [facility] Dr. Michelene Heady  .  Macular degeneration, age related   . Osteoporosis   . Overactive bladder   . Restless leg   . Spinal stenosis   . Urinary incontinence    s/p monthly PTNS with Dr. Jacqlyn Larsen, no help with overactive bladder with meds nor Botox   . UTI (urinary tract infection)   . Vertigo    no episodes for several years  . Wears dentures    partial upper and lower   Past Surgical History:  Procedure Laterality Date  .  ABDOMINAL HYSTERECTOMY     1967  . APPENDECTOMY     1967  . BLADDER SURGERY     bladder suspension; 2005, 2011-Dr. Cope  . BREAST EXCISIONAL BIOPSY Right 1970   neg  . BROW LIFT Bilateral 12/01/2016   Procedure: BLEPHAROPLASTY upper eyelid with excess skin;  Surgeon: Karle Starch, MD;  Location: [facility];  Service: Ophthalmology;  Laterality: Bilateral;  MAC  . CARPAL TUNNEL RELEASE     right 2016   . Esophageal dilitation    . eyelid surgery     2018  . FRACTURE SURGERY    . HIP SURGERY     left 2016   . INTRAMEDULLARY (IM) NAIL INTERTROCHANTERIC Left 02/27/2015   Procedure: INTRAMEDULLARY (IM) NAIL INTERTROCHANTRIC;  Surgeon: Dereck Leep, MD;  Location: [facility];  Service: Orthopedics;  Laterality: Left;  . PTOSIS REPAIR Bilateral 12/01/2016   Procedure: PTOSIS REPAIR  repair resect ex;  Surgeon: Karle Starch, MD;  Location: [facility];  Service: Ophthalmology;  Laterality: Bilateral;  . TONSILLECTOMY     Family History  Problem Relation Age of Onset  . Diabetes Mother   . Arthritis Mother   . Stroke Mother   . Heart disease Father   . Diabetes Sister   . Cancer Brother        lung 2/2 asbestos   . Diabetes Daughter   . Diabetes Brother   . Diabetes Brother   . Breast cancer Neg Hx    Social History   Socioeconomic History  . Marital status: Widowed    Spouse name: Not on file  . Number of children: Not on file  . Years of education: Not on file  . Highest education level: Not on file  Occupational History  . Not on file  Social Needs  . Financial resource strain: Not on file  . Food insecurity:    Worry: Not on file    Inability: Not on file  . Transportation needs:    Medical: Not on file    Non-medical: Not on file  Tobacco Use  . Smoking status: Never Smoker  . Smokeless tobacco: Never Used  Substance and Sexual Activity  . Alcohol use: No  . Drug use: No  . Sexual activity: Not on file  Lifestyle  . Physical activity:     Days per week: Not on file    Minutes per session: Not on file  . Stress: Not on file  Relationships  . Social connections:    Talks on phone: Not on file    Gets together: Not on file    Attends religious service: Not on file    Active member of club or organization: Not on file    Attends meetings of clubs or organizations: Not on file    Relationship status: Not on file  . Intimate partner violence:    Fear of current or ex partner: Not on file    Emotionally abused: Not  on file    Physically abused: Not on file    Forced sexual activity: Not on file  Other Topics Concern  . Not on file  Social History Narrative   Lives alone.   Daughter [name] involved in care. 1 other child    HS ed.    Retired Education officer, [employer]    No guns    Wears seat belts    Safe in relationship    Current Meds  Medication Sig  . acetaminophen (TYLENOL) 500 MG tablet Take 1,000 mg by mouth 2 (two) times daily as needed (pain).   Marland Kitchen amLODipine (NORVASC) 5 MG tablet Take 1 tablet (5 mg total) by mouth daily.  Marland Kitchen aspirin EC 81 MG tablet Take 81 mg by mouth at bedtime.  . bisacodyl (BISACODYL) 5 MG EC tablet Take 5 mg by mouth 2 (two) times a week. Take 1 tablet (5 mg) by mouth on Sunday and Wednesday nights (the night before Linzess dose)  . Calcium Carb-Cholecalciferol (CALCIUM 600 + D PO) Take 600 mg by mouth 2 (two) times daily.  . Calcium Carbonate-Vitamin D (CALTRATE 600+D) 600-400 MG-UNIT tablet Take by mouth.  . carvedilol (COREG) 3.125 MG tablet Take 3.125 mg by mouth 2 (two) times daily with a meal.  . doxycycline (VIBRAMYCIN) 100 MG capsule Take 100 mg by mouth daily with lunch. Continuous course for UTI prevention  . gabapentin (NEURONTIN) 100 MG capsule TAKE TWO CAPSULES BY MOUTH THREE TIMES DAILY  . ibuprofen (ADVIL,MOTRIN) 600 MG tablet take 1 tablet by mouth every 6 hours if needed with food DISCONTINUE ORDER WHEN COMPLETED  . linaclotide (LINZESS) 145 MCG CAPS capsule Take 1 capsule (145 mcg  total) by mouth 2 (two) times a week. Take 1 capsule (145 mcg) by mouth on Mondays and Thursdays at 2pm  . loratadine (CLARITIN) 10 MG tablet Take 10 mg by mouth daily.  Marland Kitchen losartan-hydrochlorothiazide (HYZAAR) 100-25 MG tablet Take 1 tablet by mouth daily.  . Multiple Vitamin (MULTIVITAMIN WITH MINERALS) TABS tablet Take 1 tablet by mouth daily. Centrum Silver  . Multiple Vitamins-Minerals (PRESERVISION AREDS 2) CAPS Take 1 capsule by mouth 2 (two) times daily.  . mupirocin ointment (BACTROBAN) 2 % Apply 1 application topically 2 (two) times daily. Left lower leg  . Propylene Glycol (SYSTANE BALANCE OP) Place 1 drop into both eyes 3 (three) times daily as needed (dry eyes).  Marland Kitchen rOPINIRole (REQUIP) 0.5 MG tablet TAKE 1 TABLET BY MOUTH IN THE MORNING, 1 TABLET BY MOUTH AT LUNCH, AND 1 TABLET BY MOUTH IN THE EVENING.  . traMADol (ULTRAM) 50 MG tablet Take 1 tablet (50 mg total) by mouth every 6 (six) hours as needed for moderate pain. (Patient taking differently: Take 50 mg by mouth daily as needed for moderate pain. )   Allergies  Allergen Reactions  . Codeine Rash   No results found for this or any previous visit (from the past 2160 hour(s)). Objective  Body mass index is 25.35 kg/m. Wt Readings from Last 3 Encounters:  12/24/17 150 lb (68 kg)  11/16/17 138 lb (62.6 kg)  11/10/17 152 lb (68.9 kg)   Temp Readings from Last 3 Encounters:  12/24/17 98.1 F (36.7 C) (Oral)  08/17/17 98 F (36.7 C) (Oral)  08/04/17 98.1 F (36.7 C) (Oral)   BP Readings from Last 3 Encounters:  12/24/17 134/68  11/16/17 (!) 148/68  11/10/17 (!) 160/68   Pulse Readings from Last 3 Encounters:  12/24/17 71  11/16/17 67  11/10/17 70  Physical Exam  Constitutional: She is oriented to person, place, and time. Vital signs are normal. She appears well-developed and well-nourished. She is cooperative.  HENT:  Head: Normocephalic and atraumatic.  Mouth/Throat: Oropharynx is clear and moist and mucous  membranes are normal.  Eyes: Pupils are equal, round, and reactive to light. Conjunctivae are normal.  Cardiovascular: Normal rate, regular rhythm and normal heart sounds.  Pulmonary/Chest: Effort normal and breath sounds normal.  Neurological: She is alert and oriented to person, place, and time. Gait normal.  Skin: Skin is warm, dry and intact.  Psychiatric: She has a normal mood and affect. Her speech is normal and behavior is normal. Judgment and thought content normal. Cognition and memory are normal.  Nursing note and vitals reviewed.   Assessment   1. HTN with variability  2. H/o Chronic UTI 11/24/17 culture mixed flora, overactive bladder  3. Belching  4. H/o osteoporosis  5. Chronic leg pain  6. H/o Aks and NMSC 7. HM Plan  1. Cont meds though will increase coreg 3.125 mg bid to 6.25 mg bid take 2 of 3.125 until run out  2. F/u urology [facility] on doxycycline chronically per urology  Disc cranberry with D mannose  3. Disc otc pepcid AC if this does not work may try prilosec or nexium otc  Disc foods prone to belching 4. Repeat DEXA  Consider prolia in future was on bisphosphonate  Cont otc calcium and D3  Disc strontium 500 mg qd supplement for bone health 5. F/u vascular 01/2018 pending venous US with vascular per pts daughter  6. F/u with Dr. Evorn Gong upcoming s/p bx left hand lesion healing  7.  Consider labs at f/u   High dose flu shot given today Tdap 2016 need to verify prior records  pna 23 03/03/17, need to ask about prevnar  Had zostervax disc shingrix today and given info   Colonoscopy ? 5 years agoout of age window now S/p hysterectomy out of age window pap  Mammogram repeat 08/26/17 normal  DEXA 12/31/14 femoral neck +osteoporosis, total femoral density osteopenia, spine normal -ordered repeat DEXA today  Dermatology saw 12/22/17 LN2 and bx left hand will f/u upcoming h/o BCC and left hand lesion c/w NMSC per pt and daughter   No falls as of this visit   Provider: Dr. Olivia Mackie McLean-Scocuzza-[facility]

## 2018-01-03 DIAGNOSIS — H353221 Exudative age-related macular degeneration, left eye, with active choroidal neovascularization: Secondary | ICD-10-CM | POA: Diagnosis not present

## 2018-01-03 DIAGNOSIS — H353132 Nonexudative age-related macular degeneration, bilateral, intermediate dry stage: Secondary | ICD-10-CM | POA: Diagnosis not present

## 2018-01-03 NOTE — Progress Notes (Signed)
Paper work has been faxed

## 2018-01-05 DIAGNOSIS — Z885 Allergy status to narcotic agent status: Secondary | ICD-10-CM | POA: Diagnosis not present

## 2018-01-05 DIAGNOSIS — Z7982 Long term (current) use of aspirin: Secondary | ICD-10-CM | POA: Diagnosis not present

## 2018-01-05 DIAGNOSIS — N390 Urinary tract infection, site not specified: Secondary | ICD-10-CM | POA: Diagnosis not present

## 2018-01-05 DIAGNOSIS — I10 Essential (primary) hypertension: Secondary | ICD-10-CM | POA: Diagnosis not present

## 2018-01-05 DIAGNOSIS — N3281 Overactive bladder: Secondary | ICD-10-CM | POA: Diagnosis not present

## 2018-01-05 DIAGNOSIS — Z79899 Other long term (current) drug therapy: Secondary | ICD-10-CM | POA: Diagnosis not present

## 2018-01-06 DIAGNOSIS — L570 Actinic keratosis: Secondary | ICD-10-CM | POA: Diagnosis not present

## 2018-01-12 DIAGNOSIS — L97511 Non-pressure chronic ulcer of other part of right foot limited to breakdown of skin: Secondary | ICD-10-CM | POA: Diagnosis not present

## 2018-01-12 DIAGNOSIS — B351 Tinea unguium: Secondary | ICD-10-CM | POA: Diagnosis not present

## 2018-01-12 DIAGNOSIS — M79675 Pain in left toe(s): Secondary | ICD-10-CM | POA: Diagnosis not present

## 2018-01-12 DIAGNOSIS — M79674 Pain in right toe(s): Secondary | ICD-10-CM | POA: Diagnosis not present

## 2018-01-18 ENCOUNTER — Ambulatory Visit (INDEPENDENT_AMBULATORY_CARE_PROVIDER_SITE_OTHER): Payer: Medicare Other | Admitting: Vascular Surgery

## 2018-01-18 ENCOUNTER — Ambulatory Visit (INDEPENDENT_AMBULATORY_CARE_PROVIDER_SITE_OTHER): Payer: Medicare Other

## 2018-01-18 ENCOUNTER — Encounter (INDEPENDENT_AMBULATORY_CARE_PROVIDER_SITE_OTHER): Payer: Self-pay | Admitting: Vascular Surgery

## 2018-01-18 VITALS — BP 179/77 | HR 63 | Resp 16 | Ht 67.0 in | Wt 151.0 lb

## 2018-01-18 DIAGNOSIS — I7025 Atherosclerosis of native arteries of other extremities with ulceration: Secondary | ICD-10-CM | POA: Diagnosis not present

## 2018-01-18 DIAGNOSIS — I10 Essential (primary) hypertension: Secondary | ICD-10-CM

## 2018-01-18 DIAGNOSIS — L97921 Non-pressure chronic ulcer of unspecified part of left lower leg limited to breakdown of skin: Secondary | ICD-10-CM | POA: Diagnosis not present

## 2018-01-18 DIAGNOSIS — I872 Venous insufficiency (chronic) (peripheral): Secondary | ICD-10-CM | POA: Diagnosis not present

## 2018-01-18 NOTE — Assessment & Plan Note (Signed)
A venous reflux study was performed today that revealed there was no evidence of DVT or superficial thrombophlebitis.  On the left, there was moderate segment great saphenous vein reflux.  No superficial venous reflux was seen on the right. The patient has done well with conservative therapy.  We discussed options including laser ablation of the left great saphenous vein or continued conservative therapy.  She would prefer the latter.  I will plan to see her back in 6 months.  Should she have recurrent ulceration or worsening swelling, consideration for intervention will be given.

## 2018-01-18 NOTE — Assessment & Plan Note (Signed)
Ulcer has healed and her perfusion was decent so no intervention was performed.

## 2018-01-18 NOTE — Progress Notes (Signed)
MRN : [id number]  Kristine Jacobson is a 84 y.o. (08-06-1923) female who presents with chief complaint of  Chief Complaint  Patient presents with  . Follow-up    ultrasound follow up  .  History of Present Illness: Patient returns today in follow up of leg swelling and ulceration.  Her ulcer has remained healed.  Compression stockings and elevation have kept her leg swelling down.  No new ulceration or infection.  A venous reflux study was performed today that revealed there was no evidence of DVT or superficial thrombophlebitis.  On the left, there was moderate segment great saphenous vein reflux.  No superficial venous reflux was seen on the right.  Current Outpatient Medications  Medication Sig Dispense Refill  . acetaminophen (TYLENOL) 500 MG tablet Take 1,000 mg by mouth 2 (two) times daily as needed (pain).     Marland Kitchen amLODipine (NORVASC) 5 MG tablet Take 1 tablet (5 mg total) by mouth daily.    Marland Kitchen aspirin EC 81 MG tablet Take 81 mg by mouth at bedtime.    . bisacodyl (BISACODYL) 5 MG EC tablet Take 5 mg by mouth 2 (two) times a week. Take 1 tablet (5 mg) by mouth on Sunday and Wednesday nights (the night before Linzess dose)    . Calcium Carb-Cholecalciferol (CALCIUM 600 + D PO) Take 600 mg by mouth 2 (two) times daily.    . Calcium Carbonate-Vitamin D (CALTRATE 600+D) 600-400 MG-UNIT tablet Take by mouth.    . carvedilol (COREG) 6.25 MG tablet Take 1 tablet (6.25 mg total) by mouth 2 (two) times daily with a meal. 180 tablet 3  . doxycycline (VIBRAMYCIN) 100 MG capsule Take 100 mg by mouth daily with lunch. Continuous course for UTI prevention    . gabapentin (NEURONTIN) 100 MG capsule TAKE TWO CAPSULES BY MOUTH THREE TIMES DAILY 540 capsule 0  . ibuprofen (ADVIL,MOTRIN) 600 MG tablet take 1 tablet by mouth every 6 hours if needed with food DISCONTINUE ORDER WHEN COMPLETED    . linaclotide (LINZESS) 145 MCG CAPS capsule Take 1 capsule (145 mcg total) by mouth 2 (two) times a week. Take 1  capsule (145 mcg) by mouth on Mondays and Thursdays at 2pm 30 capsule 2  . loratadine (CLARITIN) 10 MG tablet Take 10 mg by mouth daily.    Marland Kitchen losartan-hydrochlorothiazide (HYZAAR) 100-25 MG tablet Take 1 tablet by mouth daily. 90 tablet 3  . Multiple Vitamin (MULTIVITAMIN WITH MINERALS) TABS tablet Take 1 tablet by mouth daily. Centrum Silver    . Multiple Vitamins-Minerals (PRESERVISION AREDS 2) CAPS Take 1 capsule by mouth 2 (two) times daily.    . mupirocin ointment (BACTROBAN) 2 % Apply 1 application topically 2 (two) times daily. Left lower leg 30 g 0  . Propylene Glycol (SYSTANE BALANCE OP) Place 1 drop into both eyes 3 (three) times daily as needed (dry eyes).    Marland Kitchen rOPINIRole (REQUIP) 0.5 MG tablet TAKE 1 TABLET BY MOUTH IN THE MORNING, 1 TABLET BY MOUTH AT LUNCH, AND 1 TABLET BY MOUTH IN THE EVENING. 270 tablet 3  . traMADol (ULTRAM) 50 MG tablet Take 1 tablet (50 mg total) by mouth every 6 (six) hours as needed for moderate pain. (Patient taking differently: Take 50 mg by mouth daily as needed for moderate pain. ) 16 tablet 0  . furosemide (LASIX) 20 MG tablet Take 1 tablet (20 mg total) by mouth every other day as needed. Leg edema prn (Patient not taking: Reported on 12/24/2017)  10 tablet 0   No current facility-administered medications for this visit.     Past Medical History:  Diagnosis Date  . Allergy    seasonal   . Arthritis    shoulders, hands  . Cancer ([place])    [place] face-Dr. Evorn Gong   . Chronic neck pain    2/2 bulging discs   . HBP (high blood pressure)   . Hearing aid worn    bilateral  . Macular degeneration    dx'ed 2015 dry now left wet and right mild wet [facility] Dr. Michelene Heady  . Macular degeneration, age related   . Osteoporosis   . Overactive bladder   . Restless leg   . Spinal stenosis   . Urinary incontinence    s/p monthly PTNS with Dr. Jacqlyn Larsen, no help with overactive bladder with meds nor Botox   . UTI (urinary tract infection)   . Vertigo    no  episodes for several years  . Wears dentures    partial upper and lower    Past Surgical History:  Procedure Laterality Date  . ABDOMINAL HYSTERECTOMY     1967  . APPENDECTOMY     1967  . BLADDER SURGERY     bladder suspension; 2005, 2011-Dr. Cope  . BREAST EXCISIONAL BIOPSY Right 1970   neg  . BROW LIFT Bilateral 12/01/2016   Procedure: BLEPHAROPLASTY upper eyelid with excess skin;  Surgeon: Karle Starch, MD;  Location: [facility];  Service: Ophthalmology;  Laterality: Bilateral;  MAC  . CARPAL TUNNEL RELEASE     right 2016   . Esophageal dilitation    . eyelid surgery     2018  . FRACTURE SURGERY    . HIP SURGERY     left 2016   . INTRAMEDULLARY (IM) NAIL INTERTROCHANTERIC Left 02/27/2015   Procedure: INTRAMEDULLARY (IM) NAIL INTERTROCHANTRIC;  Surgeon: Dereck Leep, MD;  Location: [facility];  Service: Orthopedics;  Laterality: Left;  . PTOSIS REPAIR Bilateral 12/01/2016   Procedure: PTOSIS REPAIR  repair resect ex;  Surgeon: Karle Starch, MD;  Location: [facility];  Service: Ophthalmology;  Laterality: Bilateral;  . TONSILLECTOMY     Family History  Problem Relation Age of Onset  . Diabetes Mother   . Arthritis Mother   . Stroke Mother   . Heart disease Father   . Diabetes Sister   . Cancer Brother    lung 2/2 asbestos   . Diabetes Daughter   . Diabetes Brother   . Diabetes Brother   . Breast cancer Neg Hx      Social History Social History       Tobacco Use  . Smoking status: Never Smoker  . Smokeless tobacco: Never Used  Substance Use Topics  . Alcohol use: No  . Drug use: No  Lives in nearby facility      Allergies  Allergen Reactions  . Codeine Rash       REVIEW OF SYSTEMS(Negative unless checked)  Constitutional: [] Weight loss[] Fever[] Chills Cardiac:[] Chest pain[] Chest pressure[] Palpitations [] Shortness of breath when laying flat [] Shortness of breath at rest [] Shortness  of breath with exertion. Vascular: [] Pain in legs with walking[] Pain in legsat rest[] Pain in legs when laying flat [] Claudication [] Pain in feet when walking [] Pain in feet at rest [] Pain in feet when laying flat [] History of DVT [] Phlebitis [x] Swelling in legs [] Varicose veins [x] Non-healing ulcers Pulmonary: [] Uses home oxygen [] Productive cough[] Hemoptysis [] Wheeze [] COPD [] Asthma Neurologic: [] Dizziness [] Blackouts [] Seizures [] History of stroke [] History of TIA[] Aphasia [] Temporary blindness[] Dysphagia []   Weaknessor numbness in arms [] Weakness or numbnessin legs Musculoskeletal: [x] Arthritis [] Joint swelling [] Joint pain [] Low back pain Hematologic:[] Easy bruising[] Easy bleeding [] Hypercoagulable state [] Anemic [] Hepatitis Gastrointestinal:[] Blood in stool[] Vomiting blood[] Gastroesophageal reflux/heartburn[] Abdominal pain Genitourinary: [] Chronic kidney disease [] Difficulturination [] Frequenturination [] Burning with urination[] Hematuria Skin: [] Rashes [x] Ulcers [x] Wounds Psychological: [] History of anxiety[] History of major depression.    Physical Examination  BP (!) 179/77 (BP Location: Left Arm)   Pulse 63   Resp 16   Ht 5' 7"  (1.702 m)   Wt 151 lb (68.5 kg)   BMI 23.65 kg/m  Gen:  WD/WN, NAD.  Appears younger than stated age Head: [place]/AT, No temporalis wasting. Ear/Nose/Throat: Hearing grossly intact, nares w/o erythema or drainage Eyes: Conjunctiva clear. Sclera non-icteric Neck: Supple.  Trachea midline Pulmonary:  Good air movement, no use of accessory muscles.  Cardiac: RRR, no JVD Vascular:  Vessel Right Left  Radial Palpable Palpable                                   Gastrointestinal: soft, non-tender/non-distended. No guarding/reflex.  Musculoskeletal: M/S 5/5 throughout.  No deformity or atrophy.  Trace bilateral lower extremity edema. Neurologic: Sensation  grossly intact in extremities.  Symmetrical.  Speech is fluent.  Psychiatric: Judgment intact, Mood & affect appropriate for pt's clinical situation. Dermatologic: No rashes or ulcers noted.  No cellulitis or open wounds.       Labs No results found for this or any previous visit (from the past 2160 hour(s)).  Radiology No results found.  Assessment/Plan Hypertension blood pressure control important in reducing the progression of atherosclerotic disease. On appropriate oral medications.  Ulcer of left lower extremity ([place]) Now healed. Will come out of UNNA boots today and go to compression stockings alone  Atherosclerosis of native arteries of the extremities with ulceration ([place]) Ulcer has healed and her perfusion was decent so no intervention was performed.  Chronic venous insufficiency A venous reflux study was performed today that revealed there was no evidence of DVT or superficial thrombophlebitis.  On the left, there was moderate segment great saphenous vein reflux.  No superficial venous reflux was seen on the right. The patient has done well with conservative therapy.  We discussed options including laser ablation of the left great saphenous vein or continued conservative therapy.  She would prefer the latter.  I will plan to see her back in 6 months.  Should she have recurrent ulceration or worsening swelling, consideration for intervention will be given.    Leotis Pain, MD  01/18/2018 5:02 PM    This note was created with Dragon medical transcription system.  Any errors from dictation are purely unintentional

## 2018-01-20 ENCOUNTER — Telehealth: Payer: Self-pay | Admitting: Internal Medicine

## 2018-01-20 ENCOUNTER — Other Ambulatory Visit: Payer: Self-pay

## 2018-01-20 DIAGNOSIS — I10 Essential (primary) hypertension: Secondary | ICD-10-CM

## 2018-01-20 MED ORDER — LOSARTAN POTASSIUM-HCTZ 100-25 MG PO TABS: 1.0000 | ORAL_TABLET | Freq: Every day | ORAL | 3 refills | Status: DC

## 2018-01-20 NOTE — Telephone Encounter (Signed)
Copied from [place] [id number]. Topic: Quick Communication - Rx Refill/Question >> Jan 20, 2018  3:55 PM Sheppard Coil, [employer] L wrote: Medication: losartan-hydrochlorothiazide (HYZAAR) 100-25 MG tablet  Pt got a note from mail order pharmacy that medication is on backorder and she needs to discuss with PCP what alternative she can have in the meantime. Pt can be reached at [phone]  Preferred Pharmacy (with phone number or street name): [facility] [place], [place] [phone] (Phone) [phone] (Fax)  Agent: Please be advised that RX refills may take up to 3 business days. We ask that you follow-up with your pharmacy.

## 2018-01-20 NOTE — Telephone Encounter (Signed)
I spoke with patient & she agreed to have prescription sent to alternate/local [facility] instead. I have sent prescription in.

## 2018-02-01 ENCOUNTER — Telehealth: Payer: Self-pay | Admitting: Internal Medicine

## 2018-02-01 NOTE — Telephone Encounter (Signed)
Call pt and confirm how she is taking gabapentin before we [facility]  -is she taking 200 mg (2 pills of 100 mg) 3x per day?? [place]

## 2018-02-01 NOTE — Telephone Encounter (Signed)
She is taking 200 mg (2 pills of 100 mg) 3x per day

## 2018-02-02 ENCOUNTER — Other Ambulatory Visit: Payer: Self-pay | Admitting: Internal Medicine

## 2018-02-02 DIAGNOSIS — G2581 Restless legs syndrome: Secondary | ICD-10-CM

## 2018-02-02 MED ORDER — GABAPENTIN 100 MG PO CAPS: 200.0000 mg | ORAL_CAPSULE | Freq: Three times a day (TID) | ORAL | 3 refills | Status: DC

## 2018-02-23 ENCOUNTER — Other Ambulatory Visit: Payer: Self-pay | Admitting: Internal Medicine

## 2018-02-23 ENCOUNTER — Ambulatory Visit: Payer: Self-pay | Admitting: *Deleted

## 2018-02-23 DIAGNOSIS — R300 Dysuria: Secondary | ICD-10-CM

## 2018-02-23 NOTE — Telephone Encounter (Signed)
Schedule lab visit.

## 2018-02-23 NOTE — Telephone Encounter (Signed)
This typically requires and appt  We can have her sch lab visit for urine this 1x and we can give her prescription after we get UA and culture  Otherwise she can try cranberry with D mannose supplement otc   tMS

## 2018-02-23 NOTE — Telephone Encounter (Signed)
Left message for patient to return call back. [facility] may give and obtain information.  

## 2018-02-23 NOTE — Telephone Encounter (Signed)
Please advise 

## 2018-02-23 NOTE — Telephone Encounter (Signed)
Message from Berneta Levins sent at 02/23/2018 10:07 AM EST   Summary: medication for UTI   Pt calling requesting medication for UTI. Offered pt appointment, but she states she doesn't drive and her daughter is tied up all day. Pt wants to know if something can just be called in for her. Pt states current symptoms are odor and overall not feeling well. Pt states that she has burning and urgency. Pt uses [facility] [place] [id number] [address], [place] [phone] (Phone) [phone] (Fax)

## 2018-02-23 NOTE — Telephone Encounter (Signed)
Patient returned call, advised of the note below by Dr. Terese Door, patient verbalized understanding. Lab appointment scheduled for tomorrow at 1130.

## 2018-02-24 ENCOUNTER — Other Ambulatory Visit: Payer: Medicare Other

## 2018-02-24 DIAGNOSIS — R300 Dysuria: Secondary | ICD-10-CM

## 2018-02-25 ENCOUNTER — Other Ambulatory Visit: Payer: Self-pay | Admitting: Internal Medicine

## 2018-02-25 ENCOUNTER — Ambulatory Visit: Payer: Medicare Other | Admitting: Family

## 2018-02-25 DIAGNOSIS — N3000 Acute cystitis without hematuria: Secondary | ICD-10-CM

## 2018-02-25 MED ORDER — CIPROFLOXACIN HCL 500 MG PO TABS
500.0000 mg | ORAL_TABLET | Freq: Two times a day (BID) | ORAL | 0 refills | Status: DC
Start: 2018-02-25 — End: 2018-04-08

## 2018-02-26 LAB — URINALYSIS, ROUTINE W REFLEX MICROSCOPIC
BILIRUBIN URINE: NEGATIVE
Glucose, UA: NEGATIVE
Hyaline Cast: NONE SEEN /LPF
KETONES UR: NEGATIVE
NITRITE: NEGATIVE
Protein, ur: NEGATIVE
SPECIFIC GRAVITY, URINE: 1.006 (ref 1.001–1.03)
pH: 6 (ref 5.0–8.0)

## 2018-02-26 LAB — URINE CULTURE
MICRO NUMBER: 91489660
SPECIMEN QUALITY:: ADEQUATE

## 2018-03-03 DIAGNOSIS — L97511 Non-pressure chronic ulcer of other part of right foot limited to breakdown of skin: Secondary | ICD-10-CM | POA: Diagnosis not present

## 2018-03-14 ENCOUNTER — Encounter: Payer: Self-pay | Admitting: Internal Medicine

## 2018-03-14 DIAGNOSIS — R9389 Abnormal findings on diagnostic imaging of other specified body structures: Secondary | ICD-10-CM | POA: Diagnosis not present

## 2018-03-14 DIAGNOSIS — M6281 Muscle weakness (generalized): Secondary | ICD-10-CM | POA: Diagnosis not present

## 2018-03-14 DIAGNOSIS — Z87440 Personal history of urinary (tract) infections: Secondary | ICD-10-CM | POA: Diagnosis not present

## 2018-03-14 DIAGNOSIS — Z66 Do not resuscitate: Secondary | ICD-10-CM | POA: Diagnosis not present

## 2018-03-14 DIAGNOSIS — K449 Diaphragmatic hernia without obstruction or gangrene: Secondary | ICD-10-CM | POA: Diagnosis not present

## 2018-03-14 DIAGNOSIS — E569 Vitamin deficiency, unspecified: Secondary | ICD-10-CM | POA: Diagnosis not present

## 2018-03-14 DIAGNOSIS — R1110 Vomiting, unspecified: Secondary | ICD-10-CM | POA: Diagnosis not present

## 2018-03-14 DIAGNOSIS — R1084 Generalized abdominal pain: Secondary | ICD-10-CM | POA: Diagnosis not present

## 2018-03-14 DIAGNOSIS — Z4682 Encounter for fitting and adjustment of non-vascular catheter: Secondary | ICD-10-CM | POA: Diagnosis not present

## 2018-03-14 DIAGNOSIS — M8580 Other specified disorders of bone density and structure, unspecified site: Secondary | ICD-10-CM | POA: Diagnosis not present

## 2018-03-14 DIAGNOSIS — K5900 Constipation, unspecified: Secondary | ICD-10-CM | POA: Diagnosis not present

## 2018-03-14 DIAGNOSIS — H04123 Dry eye syndrome of bilateral lacrimal glands: Secondary | ICD-10-CM | POA: Diagnosis not present

## 2018-03-14 DIAGNOSIS — R52 Pain, unspecified: Secondary | ICD-10-CM | POA: Diagnosis not present

## 2018-03-14 DIAGNOSIS — K56600 Partial intestinal obstruction, unspecified as to cause: Secondary | ICD-10-CM | POA: Diagnosis not present

## 2018-03-14 DIAGNOSIS — N390 Urinary tract infection, site not specified: Secondary | ICD-10-CM | POA: Diagnosis not present

## 2018-03-14 DIAGNOSIS — Z85828 Personal history of other malignant neoplasm of skin: Secondary | ICD-10-CM | POA: Diagnosis not present

## 2018-03-14 DIAGNOSIS — Z90710 Acquired absence of both cervix and uterus: Secondary | ICD-10-CM | POA: Diagnosis not present

## 2018-03-14 DIAGNOSIS — K219 Gastro-esophageal reflux disease without esophagitis: Secondary | ICD-10-CM | POA: Diagnosis not present

## 2018-03-14 DIAGNOSIS — K56699 Other intestinal obstruction unspecified as to partial versus complete obstruction: Secondary | ICD-10-CM | POA: Diagnosis not present

## 2018-03-14 DIAGNOSIS — N3020 Other chronic cystitis without hematuria: Secondary | ICD-10-CM | POA: Diagnosis not present

## 2018-03-14 DIAGNOSIS — K5669 Other partial intestinal obstruction: Secondary | ICD-10-CM | POA: Diagnosis not present

## 2018-03-14 DIAGNOSIS — R0902 Hypoxemia: Secondary | ICD-10-CM | POA: Diagnosis not present

## 2018-03-14 DIAGNOSIS — M810 Age-related osteoporosis without current pathological fracture: Secondary | ICD-10-CM | POA: Diagnosis not present

## 2018-03-14 DIAGNOSIS — I10 Essential (primary) hypertension: Secondary | ICD-10-CM | POA: Diagnosis not present

## 2018-03-14 DIAGNOSIS — K589 Irritable bowel syndrome without diarrhea: Secondary | ICD-10-CM | POA: Diagnosis not present

## 2018-03-14 DIAGNOSIS — K5909 Other constipation: Secondary | ICD-10-CM | POA: Diagnosis not present

## 2018-03-14 DIAGNOSIS — Z7982 Long term (current) use of aspirin: Secondary | ICD-10-CM | POA: Diagnosis not present

## 2018-03-14 DIAGNOSIS — G2581 Restless legs syndrome: Secondary | ICD-10-CM | POA: Diagnosis not present

## 2018-03-14 DIAGNOSIS — K5651 Intestinal adhesions [bands], with partial obstruction: Secondary | ICD-10-CM | POA: Diagnosis not present

## 2018-03-14 DIAGNOSIS — K5730 Diverticulosis of large intestine without perforation or abscess without bleeding: Secondary | ICD-10-CM | POA: Diagnosis not present

## 2018-03-14 DIAGNOSIS — K580 Irritable bowel syndrome with diarrhea: Secondary | ICD-10-CM | POA: Diagnosis not present

## 2018-03-14 DIAGNOSIS — K56609 Unspecified intestinal obstruction, unspecified as to partial versus complete obstruction: Secondary | ICD-10-CM | POA: Diagnosis not present

## 2018-03-24 DIAGNOSIS — K5909 Other constipation: Secondary | ICD-10-CM | POA: Diagnosis not present

## 2018-03-24 DIAGNOSIS — K5651 Intestinal adhesions [bands], with partial obstruction: Secondary | ICD-10-CM | POA: Diagnosis not present

## 2018-03-24 DIAGNOSIS — G2581 Restless legs syndrome: Secondary | ICD-10-CM | POA: Diagnosis not present

## 2018-03-24 DIAGNOSIS — K580 Irritable bowel syndrome with diarrhea: Secondary | ICD-10-CM | POA: Diagnosis not present

## 2018-03-24 DIAGNOSIS — I10 Essential (primary) hypertension: Secondary | ICD-10-CM | POA: Diagnosis not present

## 2018-03-24 DIAGNOSIS — M6281 Muscle weakness (generalized): Secondary | ICD-10-CM | POA: Diagnosis not present

## 2018-03-24 DIAGNOSIS — G9009 Other idiopathic peripheral autonomic neuropathy: Secondary | ICD-10-CM | POA: Diagnosis not present

## 2018-03-24 DIAGNOSIS — K56699 Other intestinal obstruction unspecified as to partial versus complete obstruction: Secondary | ICD-10-CM | POA: Diagnosis not present

## 2018-03-24 DIAGNOSIS — R52 Pain, unspecified: Secondary | ICD-10-CM | POA: Diagnosis not present

## 2018-03-24 DIAGNOSIS — E569 Vitamin deficiency, unspecified: Secondary | ICD-10-CM | POA: Diagnosis not present

## 2018-03-24 DIAGNOSIS — K56609 Unspecified intestinal obstruction, unspecified as to partial versus complete obstruction: Secondary | ICD-10-CM | POA: Diagnosis not present

## 2018-03-24 DIAGNOSIS — H0100A Unspecified blepharitis right eye, upper and lower eyelids: Secondary | ICD-10-CM | POA: Diagnosis not present

## 2018-03-24 DIAGNOSIS — H04123 Dry eye syndrome of bilateral lacrimal glands: Secondary | ICD-10-CM | POA: Diagnosis not present

## 2018-03-24 DIAGNOSIS — H353221 Exudative age-related macular degeneration, left eye, with active choroidal neovascularization: Secondary | ICD-10-CM | POA: Diagnosis not present

## 2018-03-24 MED ORDER — GABAPENTIN 100 MG PO CAPS
200.00 | ORAL_CAPSULE | ORAL | Status: DC
Start: 2018-03-24 — End: 2018-03-24

## 2018-03-24 MED ORDER — AMLODIPINE BESYLATE 5 MG PO TABS
7.50 | ORAL_TABLET | ORAL | Status: DC
Start: 2018-03-25 — End: 2018-03-24

## 2018-03-24 MED ORDER — GENERIC EXTERNAL MEDICATION
1.00 | Status: DC
Start: 2018-03-24 — End: 2018-03-24

## 2018-03-24 MED ORDER — CARVEDILOL 3.125 MG PO TABS
3.13 | ORAL_TABLET | ORAL | Status: DC
Start: 2018-03-24 — End: 2018-03-24

## 2018-03-24 MED ORDER — SIMETHICONE 80 MG PO CHEW
80.00 | CHEWABLE_TABLET | ORAL | Status: DC
Start: ? — End: 2018-03-24

## 2018-03-24 MED ORDER — REFRESH P.M. OP OINT
1.00 | TOPICAL_OINTMENT | OPHTHALMIC | Status: DC
Start: 2018-03-24 — End: 2018-03-24

## 2018-03-24 MED ORDER — HYDROCHLOROTHIAZIDE 25 MG PO TABS
25.00 | ORAL_TABLET | ORAL | Status: DC
Start: 2018-03-25 — End: 2018-03-24

## 2018-03-24 MED ORDER — ALUM & MAG HYDROXIDE-SIMETH 400-400-40 MG/5ML PO SUSP
30.00 | ORAL | Status: DC
Start: ? — End: 2018-03-24

## 2018-03-24 MED ORDER — POLYETHYLENE GLYCOL 3350 17 G PO PACK
17.00 | PACK | ORAL | Status: DC
Start: 2018-03-24 — End: 2018-03-24

## 2018-03-24 MED ORDER — LOSARTAN POTASSIUM 25 MG PO TABS
100.00 | ORAL_TABLET | ORAL | Status: DC
Start: 2018-03-25 — End: 2018-03-24

## 2018-03-24 MED ORDER — ONDANSETRON HCL 4 MG/2ML IJ SOLN
4.00 | INTRAMUSCULAR | Status: DC
Start: ? — End: 2018-03-24

## 2018-03-24 MED ORDER — ROPINIROLE HCL 0.5 MG PO TABS
0.50 | ORAL_TABLET | ORAL | Status: DC
Start: 2018-03-24 — End: 2018-03-24

## 2018-03-24 MED ORDER — ACETAMINOPHEN 325 MG PO TABS
650.00 | ORAL_TABLET | ORAL | Status: DC
Start: 2018-03-24 — End: 2018-03-24

## 2018-03-24 MED ORDER — LABETALOL HCL 5 MG/ML IV SOLN
10.00 | INTRAVENOUS | Status: DC
Start: ? — End: 2018-03-24

## 2018-03-24 MED ORDER — PHENOL 1.4 % MT LIQD
2.00 | OROMUCOSAL | Status: DC
Start: ? — End: 2018-03-24

## 2018-03-29 ENCOUNTER — Inpatient Hospital Stay: Payer: Medicare Other | Admitting: Internal Medicine

## 2018-03-29 DIAGNOSIS — G9009 Other idiopathic peripheral autonomic neuropathy: Secondary | ICD-10-CM | POA: Diagnosis not present

## 2018-03-29 DIAGNOSIS — G2581 Restless legs syndrome: Secondary | ICD-10-CM | POA: Diagnosis not present

## 2018-03-29 DIAGNOSIS — K56609 Unspecified intestinal obstruction, unspecified as to partial versus complete obstruction: Secondary | ICD-10-CM | POA: Diagnosis not present

## 2018-03-29 DIAGNOSIS — I10 Essential (primary) hypertension: Secondary | ICD-10-CM | POA: Diagnosis not present

## 2018-03-30 DIAGNOSIS — H0100A Unspecified blepharitis right eye, upper and lower eyelids: Secondary | ICD-10-CM | POA: Diagnosis not present

## 2018-03-30 DIAGNOSIS — H353221 Exudative age-related macular degeneration, left eye, with active choroidal neovascularization: Secondary | ICD-10-CM | POA: Diagnosis not present

## 2018-04-03 DIAGNOSIS — Z79899 Other long term (current) drug therapy: Secondary | ICD-10-CM | POA: Diagnosis not present

## 2018-04-03 DIAGNOSIS — I10 Essential (primary) hypertension: Secondary | ICD-10-CM | POA: Diagnosis not present

## 2018-04-03 DIAGNOSIS — K580 Irritable bowel syndrome with diarrhea: Secondary | ICD-10-CM | POA: Diagnosis not present

## 2018-04-03 DIAGNOSIS — K5651 Intestinal adhesions [bands], with partial obstruction: Secondary | ICD-10-CM | POA: Diagnosis not present

## 2018-04-03 DIAGNOSIS — K56699 Other intestinal obstruction unspecified as to partial versus complete obstruction: Secondary | ICD-10-CM | POA: Diagnosis not present

## 2018-04-03 DIAGNOSIS — K5909 Other constipation: Secondary | ICD-10-CM | POA: Diagnosis not present

## 2018-04-04 ENCOUNTER — Telehealth: Payer: Self-pay

## 2018-04-04 NOTE — Telephone Encounter (Signed)
Does pt want to see GI ?  Ok orders H/H services all   [facility]

## 2018-04-04 NOTE — Telephone Encounter (Signed)
Verbal has been given to [place].  Ria Comment was concerned because pt is still pretty constipated and her abdomen was really tight and distended.   Copied from [place]. Topic: Quick Communication - Home Health Verbal Orders >> Apr 04, 2018  9:15 AM Virl Axe D wrote: Caller/Agency: [facility] Number: [phone] Requesting OT/PT/Skilled Nursing/Social Work: Skilled Nursing Frequency: 2 week 1 / 1 week 5 / 1 every other week 3  Ria Comment was concerned because pt is still pretty constipated and her abdomen was really tight and distended.

## 2018-04-05 ENCOUNTER — Other Ambulatory Visit: Payer: Self-pay | Admitting: Internal Medicine

## 2018-04-05 DIAGNOSIS — K56609 Unspecified intestinal obstruction, unspecified as to partial versus complete obstruction: Secondary | ICD-10-CM

## 2018-04-05 NOTE — Telephone Encounter (Signed)
Please refer [facility] and Dr. Bary Castilla surgery 03/2018 [facility] hospitalized for SBO (small bowel obstruction) abdomen distended and tight  [place]

## 2018-04-05 NOTE — Telephone Encounter (Signed)
Patient is ok referral to GI

## 2018-04-07 ENCOUNTER — Telehealth: Payer: Self-pay | Admitting: Internal Medicine

## 2018-04-07 NOTE — Telephone Encounter (Signed)
Both sent 

## 2018-04-07 NOTE — Telephone Encounter (Signed)
She can try Miralax otc Warm prune juice Benefiber/metamucil   Referred to GI and surgery   Inform pt   [place]

## 2018-04-07 NOTE — Telephone Encounter (Signed)
Copied from [place] [id number]. Topic: Quick Communication - Home Health Verbal Orders >> Apr 07, 2018 10:37 AM Leward Quan A wrote: Caller/Agency: [facility] Callback Number: [phone] ok to LM Requesting OT/PT/Skilled Nursing/Social Work: PT Frequency: 2 weeks 3   Patient still struggling with constipation had small BM on 04/05/2018

## 2018-04-08 ENCOUNTER — Encounter: Payer: Self-pay | Admitting: Internal Medicine

## 2018-04-08 ENCOUNTER — Ambulatory Visit (INDEPENDENT_AMBULATORY_CARE_PROVIDER_SITE_OTHER): Payer: Medicare Other | Admitting: Internal Medicine

## 2018-04-08 ENCOUNTER — Telehealth: Payer: Self-pay | Admitting: *Deleted

## 2018-04-08 ENCOUNTER — Inpatient Hospital Stay: Payer: Medicare Other | Admitting: Internal Medicine

## 2018-04-08 ENCOUNTER — Ambulatory Visit (INDEPENDENT_AMBULATORY_CARE_PROVIDER_SITE_OTHER): Payer: Medicare Other

## 2018-04-08 VITALS — BP 146/72 | HR 60 | Temp 97.9°F | Resp 18 | Ht 64.5 in | Wt 147.5 lb

## 2018-04-08 DIAGNOSIS — K56609 Unspecified intestinal obstruction, unspecified as to partial versus complete obstruction: Secondary | ICD-10-CM | POA: Diagnosis not present

## 2018-04-08 DIAGNOSIS — I493 Ventricular premature depolarization: Secondary | ICD-10-CM

## 2018-04-08 DIAGNOSIS — I10 Essential (primary) hypertension: Secondary | ICD-10-CM

## 2018-04-08 DIAGNOSIS — K5909 Other constipation: Secondary | ICD-10-CM | POA: Insufficient documentation

## 2018-04-08 DIAGNOSIS — K5900 Constipation, unspecified: Secondary | ICD-10-CM | POA: Diagnosis not present

## 2018-04-08 MED ORDER — HYDRALAZINE HCL 25 MG PO TABS: 25.0000 mg | ORAL_TABLET | Freq: Two times a day (BID) | ORAL | 3 refills | Status: DC

## 2018-04-08 MED ORDER — LINACLOTIDE 145 MCG PO CAPS: 145.0000 ug | ORAL_CAPSULE | ORAL | 11 refills | Status: DC

## 2018-04-08 NOTE — Telephone Encounter (Signed)
Copied from [place] [id number]. Topic: General - Other >> Apr 08, 2018  3:49 PM Windy Kalata wrote: Reason for CRM: Valarie Merino from radiology called states that only 1 image came through for the abdomen not 2. Tried to call office and back door number and no answer  Please call him back at [phone]

## 2018-04-08 NOTE — Telephone Encounter (Signed)
Seen in office.

## 2018-04-08 NOTE — Progress Notes (Signed)
Chief Complaint  Patient presents with  . Follow-up    Rehab   F/u with daughter  1. 02/2018 hospitalized for sbo tx medical management. CT 03/15/18 RLQ SBO -this is 2nd time since 01/2017 h/o adhesions. No abdominal pain currently n/v. She is having bowel movements. Abdomen is bloated with chronic constipation she takes linzess 2x per week due to cost and dulcolax. She has h/o abdominal adhesions per her daughter -GI appt pending  2. HTN uncontrolled on meds coreg 6.25 mg bid, losartan-hctz on 100-25 per peak list 100-12.5, norvasc 5 mg qd  3.UTI sx's resolved tx'ed 02/24/18 UTI    Review of Systems  Constitutional: Negative for weight loss.  HENT: Negative for hearing loss.   Eyes: Negative for blurred vision.  Respiratory: Negative for shortness of breath.   Cardiovascular: Negative for chest pain.  Gastrointestinal: Positive for constipation.  Genitourinary: Negative for dysuria.  Musculoskeletal: Negative for falls.  Skin: Negative for rash.  Neurological: Negative for headaches.  Psychiatric/Behavioral: Negative for depression.   Past Medical History:  Diagnosis Date  . Allergy    seasonal   . Arthritis    shoulders, hands  . Cancer ([place])    [place] face-Dr. Evorn Gong   . Chronic neck pain    2/2 bulging discs   . HBP (high blood pressure)   . Hearing aid worn    bilateral  . Macular degeneration    dx'ed 2015 dry now left wet and right mild wet [facility] Dr. Michelene Heady  . Macular degeneration, age related   . Osteoporosis   . Overactive bladder   . Restless leg   . SBO (small bowel obstruction) ([place])    01/2017, 02/2018  . Spinal stenosis   . Urinary incontinence    s/p monthly PTNS with Dr. Jacqlyn Larsen, no help with overactive bladder with meds nor Botox   . UTI (urinary tract infection)   . Vertigo    no episodes for several years  . Wears dentures    partial upper and lower   Past Surgical History:  Procedure Laterality Date  . ABDOMINAL HYSTERECTOMY     1967   . APPENDECTOMY     1967  . BLADDER SURGERY     bladder suspension; 2005, 2011-Dr. Cope  . BREAST EXCISIONAL BIOPSY Right 1970   neg  . BROW LIFT Bilateral 12/01/2016   Procedure: BLEPHAROPLASTY upper eyelid with excess skin;  Surgeon: Karle Starch, MD;  Location: [facility];  Service: Ophthalmology;  Laterality: Bilateral;  MAC  . CARPAL TUNNEL RELEASE     right 2016   . Esophageal dilitation    . eyelid surgery     2018  . FRACTURE SURGERY    . HIP SURGERY     left 2016   . INTRAMEDULLARY (IM) NAIL INTERTROCHANTERIC Left 02/27/2015   Procedure: INTRAMEDULLARY (IM) NAIL INTERTROCHANTRIC;  Surgeon: Dereck Leep, MD;  Location: [facility];  Service: Orthopedics;  Laterality: Left;  . PTOSIS REPAIR Bilateral 12/01/2016   Procedure: PTOSIS REPAIR  repair resect ex;  Surgeon: Karle Starch, MD;  Location: [facility];  Service: Ophthalmology;  Laterality: Bilateral;  . TONSILLECTOMY     Family History  Problem Relation Age of Onset  . Diabetes Mother   . Arthritis Mother   . Stroke Mother   . Heart disease Father   . Diabetes Sister   . Cancer Brother        lung 2/2 asbestos   . Diabetes Daughter   .  Cancer Daughter        DCIS breast   . Diabetes Brother   . Diabetes Brother   . Breast cancer Neg Hx    Social History   Socioeconomic History  . Marital status: Widowed    Spouse name: Not on file  . Number of children: Not on file  . Years of education: Not on file  . Highest education level: Not on file  Occupational History  . Not on file  Social Needs  . Financial resource strain: Not on file  . Food insecurity:    Worry: Not on file    Inability: Not on file  . Transportation needs:    Medical: Not on file    Non-medical: Not on file  Tobacco Use  . Smoking status: Never Smoker  . Smokeless tobacco: Never Used  Substance and Sexual Activity  . Alcohol use: No  . Drug use: No  . Sexual activity: Not on file  Lifestyle  . Physical  activity:    Days per week: Not on file    Minutes per session: Not on file  . Stress: Not on file  Relationships  . Social connections:    Talks on phone: Not on file    Gets together: Not on file    Attends religious service: Not on file    Active member of club or organization: Not on file    Attends meetings of clubs or organizations: Not on file    Relationship status: Not on file  . Intimate partner violence:    Fear of current or ex partner: Not on file    Emotionally abused: Not on file    Physically abused: Not on file    Forced sexual activity: Not on file  Other Topics Concern  . Not on file  Social History Narrative   Lives alone.   Daughter [name] involved in care. 1 other child    HS ed.    Retired Education officer, [employer]    No guns    Wears seat belts    Safe in relationship    Current Meds  Medication Sig  . acetaminophen (TYLENOL) 500 MG tablet Take 1,000 mg by mouth 2 (two) times daily as needed (pain).   Marland Kitchen amLODipine (NORVASC) 5 MG tablet Take 1 tablet (5 mg total) by mouth daily.  . bisacodyl (BISACODYL) 5 MG EC tablet Take 5 mg by mouth 2 (two) times a week. Take 1 tablet (5 mg) by mouth on Sunday and Wednesday nights (the night before Linzess dose)  . Calcium Carb-Cholecalciferol (CALCIUM 600 + D PO) Take 600 mg by mouth 2 (two) times daily.  . Calcium Carbonate-Vitamin D (CALTRATE 600+D) 600-400 MG-UNIT tablet Take by mouth.  . carvedilol (COREG) 6.25 MG tablet Take 1 tablet (6.25 mg total) by mouth 2 (two) times daily with a meal.  . doxycycline (VIBRAMYCIN) 100 MG capsule Take 100 mg by mouth daily with lunch. Continuous course for UTI prevention  . gabapentin (NEURONTIN) 100 MG capsule Take 2 capsules (200 mg total) by mouth 3 (three) times daily.  Marland Kitchen ibuprofen (ADVIL,MOTRIN) 600 MG tablet take 1 tablet by mouth every 6 hours if needed with food DISCONTINUE ORDER WHEN COMPLETED  . [START ON 04/11/2018] linaclotide (LINZESS) 145 MCG CAPS capsule Take 1 capsule  (145 mcg total) by mouth 2 (two) times a week. Take 1 capsule (145 mcg) by mouth on Mondays and Thursdays at 2pm  . losartan-hydrochlorothiazide (HYZAAR) 100-25 MG tablet Take 1 tablet  by mouth daily.  . Multiple Vitamin (MULTIVITAMIN WITH MINERALS) TABS tablet Take 1 tablet by mouth daily. Centrum Silver  . Multiple Vitamins-Minerals (PRESERVISION AREDS 2) CAPS Take 1 capsule by mouth 2 (two) times daily.  . mupirocin ointment (BACTROBAN) 2 % Apply 1 application topically 2 (two) times daily. Left lower leg  . Propylene Glycol (SYSTANE BALANCE OP) Place 1 drop into both eyes 3 (three) times daily as needed (dry eyes).  Marland Kitchen rOPINIRole (REQUIP) 0.5 MG tablet TAKE 1 TABLET BY MOUTH IN THE MORNING, 1 TABLET BY MOUTH AT LUNCH, AND 1 TABLET BY MOUTH IN THE EVENING.  . traMADol (ULTRAM) 50 MG tablet Take 1 tablet (50 mg total) by mouth every 6 (six) hours as needed for moderate pain. (Patient taking differently: Take 50 mg by mouth daily as needed for moderate pain. )  . [DISCONTINUED] linaclotide (LINZESS) 145 MCG CAPS capsule Take 1 capsule (145 mcg total) by mouth 2 (two) times a week. Take 1 capsule (145 mcg) by mouth on Mondays and Thursdays at 2pm   Allergies  Allergen Reactions  . Codeine Rash   Recent Results (from the past 2160 hour(s))  Urine Culture     Status: Abnormal   Collection Time: 02/24/18 11:29 AM  Result Value Ref Range   MICRO NUMBER: [id number]    SPECIMEN QUALITY: Adequate    Sample Source URINE    STATUS: FINAL    ISOLATE 1: Escherichia coli (A)     Comment: 50,000-100,000 CFU/mL of Escherichia coli      Susceptibility   Escherichia coli - URINE CULTURE, REFLEX    AMOX/CLAVULANIC 16 Intermediate     AMPICILLIN >=32 Resistant     AMPICILLIN/SULBACTAM >=32 Resistant     CEFAZOLIN* <=4 Not Reportable      * For infections other than uncomplicated UTIcaused by E. coli, K. pneumoniae or P. mirabilis:Cefazolin is resistant if MIC > or = 8 mcg/mL.(Distinguishing susceptible  versus intermediatefor isolates with MIC < or = 4 mcg/mL requiresadditional testing.)For uncomplicated UTI caused by E. coli,K. pneumoniae or P. mirabilis: Cefazolin issusceptible if MIC <32 mcg/mL and predictssusceptible to the oral agents cefaclor, cefdinir,cefpodoxime, cefprozil, cefuroxime, cephalexinand loracarbef.    CEFEPIME <=1 Sensitive     CEFTRIAXONE <=1 Sensitive     CIPROFLOXACIN <=0.25 Sensitive     LEVOFLOXACIN <=0.12 Sensitive     ERTAPENEM <=0.5 Sensitive     GENTAMICIN >=16 Resistant     IMIPENEM <=0.25 Sensitive     NITROFURANTOIN <=16 Sensitive     PIP/TAZO <=4 Sensitive     TOBRAMYCIN 4 Sensitive     TRIMETH/SULFA* >=320 Resistant      * For infections other than uncomplicated UTIcaused by E. coli, K. pneumoniae or P. mirabilis:Cefazolin is resistant if MIC > or = 8 mcg/mL.(Distinguishing susceptible versus intermediatefor isolates with MIC < or = 4 mcg/mL requiresadditional testing.)For uncomplicated UTI caused by E. coli,K. pneumoniae or P. mirabilis: Cefazolin issusceptible if MIC <32 mcg/mL and predictssusceptible to the oral agents cefaclor, cefdinir,cefpodoxime, cefprozil, cefuroxime, cephalexinand loracarbef.Legend:S = Susceptible  I = IntermediateR = Resistant  NS = Not susceptible* = Not tested  NR = Not reported**NN = See antimicrobic comments  Urinalysis, Routine w reflex microscopic     Status: Abnormal   Collection Time: 02/24/18 11:29 AM  Result Value Ref Range   Color, Urine YELLOW YELLOW   APPearance CLEAR CLEAR   Specific Gravity, Urine 1.006 1.001 - 1.03   pH 6.0 5.0 - 8.0   Glucose, UA NEGATIVE NEGATIVE  Bilirubin Urine NEGATIVE NEGATIVE   Ketones, ur NEGATIVE NEGATIVE   Hgb urine dipstick 2+ (A) NEGATIVE   Protein, ur NEGATIVE NEGATIVE   Nitrite NEGATIVE NEGATIVE   Leukocytes, UA 3+ (A) NEGATIVE   WBC, UA 40-60 (A) 0 - 5 /HPF   RBC / HPF 0-2 0 - 2 /HPF   Squamous Epithelial / LPF 0-5 < OR = 5 /HPF   Bacteria, UA MODERATE (A) NONE SEEN /HPF    Hyaline Cast NONE SEEN NONE SEEN /LPF   Objective  Body mass index is 24.93 kg/m. Wt Readings from Last 3 Encounters:  04/08/18 147 lb 8 oz (66.9 kg)  01/18/18 151 lb (68.5 kg)  12/24/17 150 lb (68 kg)   Temp Readings from Last 3 Encounters:  04/08/18 97.9 F (36.6 C) (Oral)  12/24/17 98.1 F (36.7 C) (Oral)  08/17/17 98 F (36.7 C) (Oral)   BP Readings from Last 3 Encounters:  04/08/18 (!) 146/72  01/18/18 (!) 179/77  12/24/17 134/68   Pulse Readings from Last 3 Encounters:  04/08/18 60  01/18/18 63  12/24/17 71    Physical Exam Vitals signs and nursing note reviewed.  Constitutional:      Appearance: Normal appearance. She is well-developed and well-groomed.  HENT:     Head: Normocephalic and atraumatic.     Mouth/Throat:     Mouth: Mucous membranes are moist.     Pharynx: Oropharynx is clear.  Eyes:     Conjunctiva/sclera: Conjunctivae normal.     Pupils: Pupils are equal, round, and reactive to light.  Cardiovascular:     Rate and Rhythm: Normal rate and regular rhythm.     Heart sounds: Normal heart sounds.     Comments: Extra beats on exam ? PVCs  Pulmonary:     Effort: Pulmonary effort is normal.     Breath sounds: Normal breath sounds.  Abdominal:     General: There is distension.     Tenderness: There is no abdominal tenderness.  Skin:    General: Skin is warm and dry.  Neurological:     General: No focal deficit present.     Mental Status: She is alert and oriented to person, place, and time.     Gait: Gait normal.  Psychiatric:        Attention and Perception: Attention and perception normal.        Mood and Affect: Mood and affect normal.        Speech: Speech normal.        Behavior: Behavior normal. Behavior is cooperative.        Thought Content: Thought content normal.        Cognition and Memory: Cognition and memory normal.        Judgment: Judgment normal.     Assessment   1. SBO likely 2/2 adhesions, chronic constipation  2.  HTN  3. Resolved UTI  4. HM 5. C/w PVCS Plan   1. Hold surgery referral  Cont GI referral for now  Given samples linzess 290 mcg to cut in 1/2 she is doing it 2x per week with dulcolax due to cost may need to do linzess 145 qd  F/u with GI  Xray today  2. Cont meds add hydralazine 25 mg bid  Confirm losartan hctz dose 100/25 vs 100/12.5  norvasc 5 mg qd  Coreg 6.25 mg bid  3. Monitor   4.  utd flu  Tdap 2016 need to verify prior records  pna 23  03/03/17, need to ask about prevnar  Had zostervax disc shingrix today and given info   Colonoscopy ? 5 years agoout of age window now S/p hysterectomy out of age window pap  Mammogram repeat 08/26/17 normal  DEXA 12/31/14 femoral neck +osteoporosis, total femoral density osteopenia, spine normal -ordered repeat DEXA not done yet   Dermatology saw 12/22/17 LN2 and bx left hand will f/u upcoming h/o BCC and left hand lesion c/w NMSC per pt and daughter   5.  Refer cards for holter  Refer cards needs holter   Provider: Dr. Olivia Mackie McLean-Scocuzza-Internal Medicine

## 2018-04-08 NOTE — Telephone Encounter (Signed)
Copied from [facility]. Topic: General - Other >> Apr 07, 2018  4:42 PM Windy Kalata wrote: Reason for CRM: Sherlynn Stalls from [facility] states that the patient has blocked her from calling. Just FYI  Best call back is [phone]

## 2018-04-08 NOTE — Patient Instructions (Addendum)
Premature Ventricular Contraction  A premature ventricular contraction (PVC) is a common kind of irregular heartbeat (arrhythmia). These contractions are extra heartbeats that start in the ventricles of the heart and occur too early in the normal sequence. During the PVC, the heart's normal electrical pathway is not used, so the beat is shorter and less effective. In most cases, these contractions come and go and do not require treatment. What are the causes? Common causes of the condition include:  Smoking.  Drinking alcohol.  Certain medicines.  Some illegal drugs.  Stress.  Caffeine. Certain medical conditions can also cause PVCs:  Heart failure.  Heart attack, or coronary artery disease.  Heart valve problems.  Changes in minerals in the blood (electrolytes).  Low blood oxygen levels or high carbon dioxide levels. In many cases, the cause of this condition is not known. What are the signs or symptoms? The main symptom of this condition is fast or skipped heartbeats (palpitations). Other symptoms include:  Chest pain.  Shortness of breath.  Feeling tired.  Dizziness.  Difficulty exercising. In some cases, there are no symptoms. How is this diagnosed? This condition may be diagnosed based on:  Your medical history.  A physical exam. During the exam, the health care provider will check for irregular heartbeats.  Tests, such as: ? An ECG (electrocardiogram) to monitor the electrical activity of your heart. ? Ambulatory cardiac monitor. This device records your heartbeats for 24 hours or more. ? Stress tests to see how exercise affects your heart rhythm and blood supply. ? Echocardiogram. This test uses sound waves (ultrasound) to produce an image of your heart. ? Electrophysiology study (EPS). This test checks for electrical problems in your heart. How is this treated? Treatment for this condition depends on any underlying conditions, the type of PVCs that you  are having, and how much the symptoms are interfering with your daily life. Possible treatments include:  Avoiding things that cause premature contractions (triggers). These include caffeine and alcohol.  Taking medicines if symptoms are severe or if the extra heartbeats are frequent.  Getting treatment for underlying conditions that cause PVCs.  Having an implantable cardioverter defibrillator (ICD), if you are at risk for a serious arrhythmia. The ICD is a small device that is inserted into your chest to monitor your heartbeat. When it senses an irregular heartbeat, it sends a shock to bring the heartbeat back to normal.  Having a procedure to destroy the portion of the heart tissue that sends out abnormal signals (catheter ablation). In some cases, no treatment is required. Follow these instructions at home: Alcohol use   Do not drink alcohol if: ? Your health care provider tells you not to drink. ? You are pregnant, may be pregnant, or are planning to become pregnant. ? Alcohol triggers your episodes.  If you drink alcohol, limit how much you have. You may drink: ? 0-1 drink a day for women. ? 0-2 drinks a day for men.  Be aware of how much alcohol is in your drink. In the [place] one drink equals one typical bottle of beer (12 oz), one-half glass of wine (5 oz), or one shot of hard liquor (1 oz). General instructions  Do not use any products that contain nicotine or tobacco, such as cigarettes and e-cigarettes. If you need help quitting, ask your health care provider.  Find healthy ways to manage stress. Avoid stressful situations when possible.  Exercise regularly. Ask your health care provider what type of exercise is safe  for you.  Try to get at least 7-9 hours of sleep each night, or as much as recommended by your health care provider.  If caffeine triggers episodes of PVC, do not eat, drink, or use anything with caffeine in it.  Do not use illegal drugs.  Take  over-the-counter and prescription medicines only as told by your health care provider.  Keep all follow-up visits as told by your health care provider. This is important. Contact a health care provider if you:  Feel palpitations. Get help right away if you:  Have chest pain.  Have shortness of breath.  Have sweating for no reason.  Have nausea and vomiting.  Become light-headed or you faint. Summary  A premature ventricular contraction (PVC) is a common kind of irregular heartbeat (arrhythmia).  In most cases, these contractions come and go and do not require treatment.  You may need to wear an ambulatory cardiac monitor. This records your heartbeats for 24 hours or more.  Treatment depends on any underlying conditions, the type of PVCs that you are having, and how much the symptoms are interfering with your daily life. This information is not intended to replace advice given to you by your health care provider. Make sure you discuss any questions you have with your health care provider. Document Released: 10/18/2003 Document Revised: 10/15/2017 Document Reviewed: 04/20/2017 Elsevier Interactive Patient Education  2019 [employer]  Bowel Obstruction A bowel obstruction is a blockage in the small or large bowel. The bowel, which is also called the intestine, is a long, slender tube that connects the stomach to the anus. When a person eats and drinks, food and fluids go from the mouth to the stomach to the small bowel. This is where most of the nutrients in the food and fluids are absorbed. After the small bowel, material passes through the large bowel for further absorption until any leftover material leaves the body as stool through the anus during a bowel movement. A bowel obstruction will prevent food and fluids from passing through the bowel as they normally do during digestion. The bowel can become partially or completely blocked. If this condition is not treated, it can be  dangerous because the bowel could rupture. What are the causes? Common causes of this condition include:  Scar tissue (adhesions) from previous surgery or treatment with high-energy X-rays (radiation).  Recent surgery. This may cause the movements of the bowel to slow down and cause food to block the intestine.  Inflammatory bowel disease, such asCrohn's disease or diverticulitis.  Growths or tumors.  A bulging organ (hernia).  Twisting of the bowel (volvulus).  A foreign body.  Slipping of a part of the bowel into another part (intussusception). What are the signs or symptoms? Symptoms of this condition include:  Pain in the abdomen. Depending on the degree of obstruction, pain may be: ? Mild or severe. ? Dull cramping or sharp pain. ? In one area or in the entire abdomen.  Nausea and vomiting. Vomit may be greenish or a yellow bile color.  Bloating in the abdomen.  Difficulty passing stool (constipation).  Lack of passing gas.  Frequent belching.  Diarrhea. This may occur if the obstruction is partial and runny stool is able to leak around the obstruction. How is this diagnosed? This condition may be diagnosed based on:  A physical exam.  Medical history.  Imaging tests of the abdomen or pelvis, such as X-ray or CT scan.  Blood or urine tests. How is this  treated? Treatment for this condition depends on the cause and severity of the problem. Treatment may include:  Fluids and pain medicines that are given through an IV. Your health care provider may instruct you not to eat or drink if you have nausea or vomiting.  Eating a simple diet. You may be asked to consume a clear liquid diet for several days. This allows the bowel to rest.  Placement of a small tube (nasogastric tube) into the stomach. This will relieve pain, discomfort, and nausea by removing blocked air and fluids from the stomach. It can also help the obstruction clear up faster.  Surgery. This  may be required if other treatments do not work. Surgery may be required for: ? Bowel obstruction from a hernia. This can be an emergency procedure. ? Scar tissue that causes frequent or severe obstructions. Follow these instructions at home: Medicines  Take over-the-counter and prescription medicines only as told by your health care provider.  If you were prescribed an antibiotic medicine, take it as told by your health care provider. Do not stop taking the antibiotic even if you start to feel better. General instructions  Follow instructions from your health care provider about eating restrictions. You may need to avoid solid foods and consume only clear liquids until your condition improves.  Return to your normal activities as told by your health care provider. Ask your health care provider what activities are safe for you.  Avoid sitting for a long time without moving. Get up to take short walks every 1-2 hours. This is important to improve blood flow and breathing. Ask for help if you feel weak or unsteady.  Keep all follow-up visits as told by your health care provider. This is important. How is this prevented? After having a bowel obstruction, you are more likely to have another. You may do the following things to prevent another obstruction:  If you have a long-term (chronic) disease, pay attention to your symptoms and contact your health care provider if you have questions or concerns.  Avoid becoming constipated. To prevent or treat constipation, your health care provider may recommend that you: ? Drink enough fluid to keep your urine pale yellow. ? Take over-the-counter or prescription medicines. ? Eat foods that are high in fiber, such as beans, whole grains, and fresh fruits and vegetables. ? Limit foods that are high in fat and processed sugars, such as fried or sweet foods.  Stay active. Exercise for 30 minutes or more, 5 or more days each week. Ask your health care  provider which exercises are safe for you.  Avoid stress. Find ways to reduce stress, such as meditation, exercise, or taking time for activities that relax you.  Instead of eating three large meals each day, eat three small meals with three small snacks.  Work with a Microbiologist to make a healthy meal plan that works for you.  Do not use any products that contain nicotine or tobacco, such as cigarettes and e-cigarettes. If you need help quitting, ask your health care provider. Contact a health care provider if you:  Have a fever.  Have chills. Get help right away if you:  Have increased pain or cramping.  Vomit blood.  Have uncontrolled vomiting or nausea.  Cannot drink fluids because of vomiting or pain.  Become confused.  Begin feeling very thirsty (dehydrated).  Have severe bloating.  Feel extremely weak or you faint. Summary  A bowel obstruction is a blockage in the small or large  bowel.  A bowel obstruction will prevent food and fluids from passing through the bowel as they normally do during digestion.  Treatment for this condition depends on the cause and severity of the problem. It may include fluids and pain medicines through an IV, a simple diet, a nasogastric tube, or surgery.  Follow instructions from your health care provider about eating restrictions. You may need to avoid solid foods and consume only clear liquids until your condition improves. This information is not intended to replace advice given to you by your health care provider. Make sure you discuss any questions you have with your health care provider. Document Released: 05/19/2005 Document Revised: 07/14/2017 Document Reviewed: 07/14/2017 Elsevier Interactive Patient Education  2019 [employer].  Hydralazine tablets What is this medicine? HYDRALAZINE (hye DRAL a zeen) is a type of vasodilator. It relaxes blood vessels, increasing the blood and oxygen supply to your heart. This medicine is used  to treat high blood pressure. This medicine may be used for other purposes; ask your health care provider or pharmacist if you have questions. COMMON BRAND NAME(S): Apresoline What should I tell my health care provider before I take this medicine? They need to know if you have any of these conditions: -blood vessel disease -heart disease including angina or history of heart attack -kidney or liver disease -systemic lupus erythematosus (SLE) -an unusual or allergic reaction to hydralazine, tartrazine dye, other medicines, foods, dyes, or preservatives -pregnant or trying to get pregnant -breast-feeding How should I use this medicine? Take this medicine by mouth with a glass of water. Follow the directions on the prescription label. Take your doses at regular intervals. Do not take your medicine more often than directed. Do not stop taking except on the advice of your doctor or health care professional. Talk to your pediatrician regarding the use of this medicine in children. Special care may be needed. While this drug may be prescribed for children for selected conditions, precautions do apply. Overdosage: If you think you have taken too much of this medicine contact a poison control center or emergency room at once. NOTE: This medicine is only for you. Do not share this medicine with others. What if I miss a dose? If you miss a dose, take it as soon as you can. If it is almost time for your next dose, take only that dose. Do not take double or extra doses. What may interact with this medicine? -medicines for high blood pressure -medicines for mental depression This list may not describe all possible interactions. Give your health care provider a list of all the medicines, herbs, non-prescription drugs, or dietary supplements you use. Also tell them if you smoke, drink alcohol, or use illegal drugs. Some items may interact with your medicine. What should I watch for while using this  medicine? Visit your doctor or health care professional for regular checks on your progress. Check your blood pressure and pulse rate regularly. Ask your doctor or health care professional what your blood pressure and pulse rate should be and when you should contact him or her. You may get drowsy or dizzy. Do not drive, use machinery, or do anything that needs mental alertness until you know how this medicine affects you. Do not stand or sit up quickly, especially if you are an older patient. This reduces the risk of dizzy or fainting spells. Alcohol may interfere with the effect of this medicine. Avoid alcoholic drinks. Do not treat yourself for coughs, colds, or pain while  you are taking this medicine without asking your doctor or health care professional for advice. Some ingredients may increase your blood pressure. What side effects may I notice from receiving this medicine? Side effects that you should report to your doctor or health care professional as soon as possible: -chest pain, or fast or irregular heartbeat -fever, chills, or sore throat -numbness or tingling in the hands or feet -shortness of breath -skin rash, redness, blisters or itching -stiff or swollen joints -sudden weight gain -swelling of the feet or legs -swollen lymph glands -unusual weakness Side effects that usually do not require medical attention (report to your doctor or health care professional if they continue or are bothersome): -diarrhea, or constipation -headache -loss of appetite -nausea, vomiting This list may not describe all possible side effects. Call your doctor for medical advice about side effects. You may report side effects to FDA at 1-800-FDA-1088. Where should I keep my medicine? Keep out of the reach of children. Store at room temperature between 15 and 30 degrees C (59 and 86 degrees F). Throw away any unused medicine after the expiration date. NOTE: This sheet is a summary. It may not cover  all possible information. If you have questions about this medicine, talk to your doctor, pharmacist, or health care provider.  2019 Elsevier/Gold Standard (2007-07-15 15:44:58)

## 2018-04-11 ENCOUNTER — Encounter: Payer: Self-pay | Admitting: *Deleted

## 2018-04-11 NOTE — Progress Notes (Signed)
General surgery has been cancelled

## 2018-04-12 ENCOUNTER — Ambulatory Visit: Payer: Medicare Other | Admitting: General Surgery

## 2018-04-13 ENCOUNTER — Telehealth: Payer: Self-pay | Admitting: Internal Medicine

## 2018-04-13 NOTE — Telephone Encounter (Signed)
linzess 145 was sent to [facility] so they should have Rx  She can take 1/2 capsule not the full 290 capsule will equal 145 total for the day  -we dont have samples of the 145 dose   Inform daughter of Xray results of abdomen  She can continue Dulcolax if she is still constipated with Linzess 2x per week if she needs it only as needed   Melissa please check on referrals to GI and inform daughter    [place]

## 2018-04-13 NOTE — Telephone Encounter (Signed)
Copied from [place] [id number]. Topic: Quick Communication - See Telephone Encounter >> Apr 13, 2018 10:51 AM Reyne Dumas L wrote: CRM for notification. See Telephone encounter for: 04/13/18.  Pt's daughter, [name], calling:  States she has questions for the doctor about her mother. 1.  Pt was given samples of Linzess and told her to cut them in half until mail order supply came in - however what was given was capsules so they can't be cut in half.  Pt has 5 left of old pills, what does she need to do about getting the correct dose of the sample medication. 2.  Referrals were supposed to be make for other physicians.  [name] would like the pt's phone number to be taken off of those and communication to go through [place] - states pt can't remember anything. 3.  States Fransisco Beau called pt on Monday with results of x-ray, but pt can't remember what he said - [name] would like someone to call her to give her those results. 4.  [name] needs to know if pt should continue Dulcolax on Sundays and Wednesdays. 5.  [name] would like her number to be the only number for the pt's account (unsure if I can make that change) - Cheryl's number is [phone].

## 2018-04-14 NOTE — Telephone Encounter (Signed)
It looks like they tried calling several times to schedule. It looks like they have mailed her a letter stating to give them a call. I will contact her daughter with the number to call to schedule this. Thanks [employer]

## 2018-04-15 NOTE — Telephone Encounter (Signed)
Pt's daughter, [name], called back.  She was given the information about [facility] referral.  [name] wants to know what x-ray results were and has questions about medication doses. [name] can be reached at [phone]

## 2018-04-15 NOTE — Telephone Encounter (Signed)
Patient's daughter Kristine Jacobson called and advised of Dr. Terese Door note below and the x-ray results noted on 04/11/18. She says her mother is not remembering all that is said to her when she's called, so she asked to only be called and the number was updated in the patient's profile. Kristine Jacobson says when they were in the office, Dr. Aundra Dubin told her to take Linzess every day, not twice a week, and she didn't mention taking Dulcolax. She want's to know how is the patient to take Linzess and if she still needs to take Dulcolax. She says her mom is having a bowel movement every day and she's asked her to keep a record, but she's not doing it. I advised someone from the office will call with Dr. Claris Gladden recommendation.

## 2018-04-15 NOTE — Telephone Encounter (Signed)
Dulcolax was only 2x per week she told me at the visit  If her mom is having memory problems and needs a referral to neurology let me know  This could be natural part of aging   If they need more services at home to help care for mom let me know due to memory issues   [place]

## 2018-04-19 NOTE — Telephone Encounter (Signed)
Patients daughter was informed.  Patients daughter understood and no questions, comments, or concerns at this time.

## 2018-04-27 ENCOUNTER — Ambulatory Visit: Payer: Medicare Other | Admitting: Internal Medicine

## 2018-04-28 ENCOUNTER — Encounter: Payer: Self-pay | Admitting: Gastroenterology

## 2018-04-28 ENCOUNTER — Ambulatory Visit: Payer: Medicare Other | Admitting: Gastroenterology

## 2018-04-28 VITALS — BP 144/61 | HR 71 | Ht 64.5 in | Wt 149.4 lb

## 2018-04-28 DIAGNOSIS — K529 Noninfective gastroenteritis and colitis, unspecified: Secondary | ICD-10-CM

## 2018-04-28 DIAGNOSIS — K5900 Constipation, unspecified: Secondary | ICD-10-CM | POA: Diagnosis not present

## 2018-04-28 NOTE — Progress Notes (Signed)
Kristine Jacobson [address]  [place]  [place], [place] [address]  Main: [phone]  Fax: [phone]   Gastroenterology Consultation  Referring Provider:     McLean-Scocuzza, Olivia Mackie * Primary Care Physician:  McLean-Scocuzza, Nino Glow, MD Reason for Consultation:     Constipation, history of small bowel obstruction        HPI:    Chief Complaint  Patient presents with  . New Patient (Initial Visit)    referral for constipation, hx small bowel obstruction    Kristine Jacobson is a 85 y.o. y/o female referred for consultation & management  by Dr. Terese Door, Nino Glow, MD.  Patient presents with her daughter today to discuss her symptoms.  She has had history of chronic constipation and was previously on NSAIDs twice a week which has been increased to once daily by her primary care provider for the last few weeks.  Patient reports with the Linzess she moves her bowels either every other day or every day but they are small in quantity.  No blood in stool.  Has been trying to drink more water now given her constipation and history of small bowel obstruction.  The patient denies abdominal or flank pain, anorexia, nausea or vomiting, dysphagia, change in bowel habits or black or bloody stools or weight loss.   Patient was admitted to [facility] in December 2019 with small bowel obstruction managed conservatively and followed by surgery during that inpatient admission.  CT on that admission showed dilated loops of small bowel to 5.1 cm, fecalization of the small bowel in the right lower quadrant with a transition point in the right lower quadrant.  Mesenteric thickening adjacent to this dilated small bowel loops was also reported.  Prior to this she was admitted in November 2018 with small bowel obstruction as well.  Which reported transition point in the right lower quadrant representing ileus or partial small bowel obstruction.  Enteritis or paralytic ileus was the differential on that  CT report.  Patient was managed conservatively during this episode as well.  Patient states leading up to her hospitalizations both time she was constipated for 3 to 4 days and did not have a bowel movement during that time and then ended up having nausea and vomiting.  She has had previous history of abdominal surgeries but her last surgery was 50 years ago.  She does recall a year after her hysterectomy she had severe abdominal pain which led to exploratory laparotomy with lysis of adhesions at that time.  Since then she has been asymptomatic.  Last colonoscopy, 2005 for screening with Dr. Nicolasa Ducking.  This reported excellent prep, extent of exam cecum, normal colon.  Internal hemorrhoids reported.  EGD 2012 by Dr. Vira Agar for dysphagia.  Reported normal esophagus stomach and duodenum.  Reports dilation with savory dilator to 16 mm at the time.  Past Medical History:  Diagnosis Date  . Allergy    seasonal   . Arthritis    shoulders, hands  . Cancer ([place])    [place] face-Dr. Evorn Gong   . Chronic neck pain    2/2 bulging discs   . HBP (high blood pressure)   . Hearing aid worn    bilateral  . Macular degeneration    dx'ed 2015 dry now left wet and right mild wet [facility] Dr. Michelene Heady  . Macular degeneration, age related   . Osteoporosis   . Overactive bladder   . Restless leg   . SBO (small bowel  obstruction) ([place])    01/2017, 02/2018  . Spinal stenosis   . Urinary incontinence    s/p monthly PTNS with Dr. Jacqlyn Larsen, no help with overactive bladder with meds nor Botox   . UTI (urinary tract infection)   . Vertigo    no episodes for several years  . Wears dentures    partial upper and lower    Past Surgical History:  Procedure Laterality Date  . ABDOMINAL HYSTERECTOMY     1967  . APPENDECTOMY     1967  . BLADDER SURGERY     bladder suspension; 2005, 2011-Dr. Cope  . BREAST EXCISIONAL BIOPSY Right 1970   neg  . BROW LIFT Bilateral 12/01/2016   Procedure: BLEPHAROPLASTY upper  eyelid with excess skin;  Surgeon: Karle Starch, MD;  Location: [facility];  Service: Ophthalmology;  Laterality: Bilateral;  MAC  . CARPAL TUNNEL RELEASE     right 2016   . Esophageal dilitation    . eyelid surgery     2018  . FRACTURE SURGERY    . HIP SURGERY     left 2016   . INTRAMEDULLARY (IM) NAIL INTERTROCHANTERIC Left 02/27/2015   Procedure: INTRAMEDULLARY (IM) NAIL INTERTROCHANTRIC;  Surgeon: Dereck Leep, MD;  Location: [facility];  Service: Orthopedics;  Laterality: Left;  . PTOSIS REPAIR Bilateral 12/01/2016   Procedure: PTOSIS REPAIR  repair resect ex;  Surgeon: Karle Starch, MD;  Location: [facility];  Service: Ophthalmology;  Laterality: Bilateral;  . TONSILLECTOMY      Prior to Admission medications   Medication Sig Start Date End Date Taking? Authorizing Provider  acetaminophen (TYLENOL) 500 MG tablet Take 1,000 mg by mouth 2 (two) times daily as needed (pain).     [provider]  amLODipine (NORVASC) 5 MG tablet Take 1 tablet (5 mg total) by mouth daily. 08/04/17   McLean-Scocuzza, Nino Glow, MD  aspirin EC 81 MG tablet Take 81 mg by mouth at bedtime.    [provider]  bisacodyl (BISACODYL) 5 MG EC tablet Take 5 mg by mouth 2 (two) times a week. Take 1 tablet (5 mg) by mouth on Sunday and Wednesday nights (the night before Linzess dose)    [provider]  Calcium Carb-Cholecalciferol (CALCIUM 600 + D PO) Take 600 mg by mouth 2 (two) times daily.    [provider]  Calcium Carbonate-Vitamin D (CALTRATE 600+D) 600-400 MG-UNIT tablet Take by mouth.    [provider]  carvedilol (COREG) 6.25 MG tablet Take 1 tablet (6.25 mg total) by mouth 2 (two) times daily with a meal. 12/24/17   McLean-Scocuzza, Nino Glow, MD  doxycycline (VIBRAMYCIN) 100 MG capsule Take 100 mg by mouth daily with lunch. Continuous course for UTI prevention    [provider]  furosemide (LASIX) 20 MG tablet Take 1 tablet (20 mg  total) by mouth every other day as needed. Leg edema prn Patient not taking: Reported on 12/24/2017 08/17/17   McLean-Scocuzza, Nino Glow, MD  gabapentin (NEURONTIN) 100 MG capsule Take 2 capsules (200 mg total) by mouth 3 (three) times daily. 02/02/18   McLean-Scocuzza, Nino Glow, MD  hydrALAZINE (APRESOLINE) 25 MG tablet Take 1 tablet (25 mg total) by mouth 2 (two) times daily. 04/08/18   McLean-Scocuzza, Nino Glow, MD  ibuprofen (ADVIL,MOTRIN) 600 MG tablet take 1 tablet by mouth every 6 hours if needed with food DISCONTINUE ORDER WHEN COMPLETED 12/03/15   [provider]  linaclotide (LINZESS) 145 MCG CAPS capsule  Take 1 capsule (145 mcg total) by mouth 2 (two) times a week. Take 1 capsule (145 mcg) by mouth on Mondays and Thursdays at 2pm 04/11/18   McLean-Scocuzza, Nino Glow, MD  loratadine (CLARITIN) 10 MG tablet Take 10 mg by mouth daily.    [provider]  losartan-hydrochlorothiazide (HYZAAR) 100-25 MG tablet Take 1 tablet by mouth daily. 01/20/18   McLean-Scocuzza, Nino Glow, MD  Multiple Vitamin (MULTIVITAMIN WITH MINERALS) TABS tablet Take 1 tablet by mouth daily. Centrum Silver    [provider]  Multiple Vitamins-Minerals (PRESERVISION AREDS 2) CAPS Take 1 capsule by mouth 2 (two) times daily.    [provider]  mupirocin ointment (BACTROBAN) 2 % Apply 1 application topically 2 (two) times daily. Left lower leg 08/17/17   McLean-Scocuzza, Nino Glow, MD  Propylene Glycol (SYSTANE BALANCE OP) Place 1 drop into both eyes 3 (three) times daily as needed (dry eyes).    [provider]  rOPINIRole (REQUIP) 0.5 MG tablet TAKE 1 TABLET BY MOUTH IN THE MORNING, 1 TABLET BY MOUTH AT LUNCH, AND 1 TABLET BY MOUTH IN THE EVENING. 12/23/17   McLean-Scocuzza, Nino Glow, MD  traMADol (ULTRAM) 50 MG tablet Take 1 tablet (50 mg total) by mouth every 6 (six) hours as needed for moderate pain. Patient taking differently: Take 50 mg by mouth daily as needed for moderate pain.   09/15/15   Colerain Lions, PA-C    Family History  Problem Relation Age of Onset  . Diabetes Mother   . Arthritis Mother   . Stroke Mother   . Heart disease Father   . Diabetes Sister   . Cancer Brother        lung 2/2 asbestos   . Diabetes Daughter   . Cancer Daughter        DCIS breast   . Diabetes Brother   . Diabetes Brother   . Breast cancer Neg Hx      Social History   Tobacco Use  . Smoking status: Never Smoker  . Smokeless tobacco: Never Used  Substance Use Topics  . Alcohol use: No  . Drug use: No    Allergies as of 04/28/2018 - Review Complete 04/28/2018  Allergen Reaction Noted  . Codeine Rash 06/08/2013    Review of Systems:    All systems reviewed and negative except where noted in HPI.   Physical Exam:  BP (!) 144/61   Pulse 71   Ht 5' 4.5" (1.638 m)   Wt 149 lb 6.4 oz (67.8 kg)   BMI 25.25 kg/m  No LMP recorded. Patient has had a hysterectomy. Psych:  Alert and cooperative. Normal mood and affect. General:   Alert,  Well-developed, well-nourished, pleasant and cooperative in NAD Head:  Normocephalic and atraumatic. Eyes:  Sclera clear, no icterus.   Conjunctiva pink. Ears:  Normal auditory acuity. Nose:  No deformity, discharge, or lesions. Mouth:  No deformity or lesions,oropharynx pink & moist. Neck:  Supple; no masses or thyromegaly. Abdomen:  Normal bowel sounds.  No bruits.  Soft, non-tender and non-distended without masses, hepatosplenomegaly or hernias noted.  No guarding or rebound tenderness.    Msk:  Symmetrical without gross deformities. Good, equal movement & strength bilaterally. Pulses:  Normal pulses noted. Extremities:  No clubbing or edema.  No cyanosis. Neurologic:  Alert and oriented x3;  grossly normal neurologically. Skin:  Intact without significant lesions or rashes. No jaundice. Lymph Nodes:  No significant cervical adenopathy. Psych:  Alert and cooperative.  Normal mood and affect.   Labs: CBC    Component  Value Date/Time   WBC 5.0 08/02/2017 0957   RBC 4.36 08/02/2017 0957   HGB 13.1 08/02/2017 0957   HCT 39.2 08/02/2017 0957   PLT 175.0 08/02/2017 0957   MCV 90.1 08/02/2017 0957   MCH 30.6 09/15/2015 1357   MCHC 33.4 08/02/2017 0957   RDW 13.8 08/02/2017 0957   LYMPHSABS 1.4 08/02/2017 0957   MONOABS 0.4 08/02/2017 0957   EOSABS 0.1 08/02/2017 0957   BASOSABS 0.1 08/02/2017 0957   CMP     Component Value Date/Time   NA 141 08/02/2017 0957   K 4.1 08/02/2017 0957   CL 104 08/02/2017 0957   CO2 28 08/02/2017 0957   GLUCOSE 95 08/02/2017 0957   BUN 21 08/02/2017 0957   CREATININE 0.77 08/02/2017 0957   CALCIUM 9.5 08/02/2017 0957   PROT 7.2 08/02/2017 0957   ALBUMIN 4.1 08/02/2017 0957   AST 21 08/02/2017 0957   ALT 17 08/02/2017 0957   ALKPHOS 46 08/02/2017 0957   BILITOT 0.4 08/02/2017 0957   GFRNONAA >60 03/14/2015 0920   GFRAA >60 03/14/2015 0920    Imaging Studies: Dg Abd 1 View  Result Date: 04/11/2018 CLINICAL DATA:  85 year old female constipation. History of small-bowel obstruction. Abdominal distension. Initial encounter. EXAM: ABDOMEN - 1 VIEW.  Submitted for review 04/11/2018. COMPARISON:  06/28/2017. FINDINGS: Single upright view of the abdomen not including lung bases reveals moderate stool throughout the colon. No abnormal gas/gas fluid-filled small bowel loops. Scoliosis lumbar spine convex left. Calcified aorta. Prior left hip surgery. Can not assess for free air as diaphragms not included on present exam. IMPRESSION: 1. Moderate stool throughout the colon. 2. No plain film evidence of bowel obstruction. 3.  Aortic Atherosclerosis (ICD10-I70.0). Electronically Signed   By: Genia Del M.D.   On: 04/11/2018 07:37    Assessment and Plan:   Kristine Jacobson is a 85 y.o. y/o female has been referred for constipation and episodes of small bowel obstruction x2  On review of her records from [facility], CT reported transition point in the small bowel, with fecalization  and mesenteric thickening. As per [facility] notes, her small bowel obstruction episodes have been attributed to infectious enteritis versus adhesions versus constipation.  Her previous surgeries were over 50 years ago and it is not typical for adhesions to be causing new small bowel obstructions all of a sudden.  She does not have any previous history of small bowel strictures or previous symptoms to attribute to IBD.  However, fecalization seen on CT scan is concerning.  We will obtain MR enterography to rule out any small bowel abnormalities given both episodes occurred around the same transition point according to CT reports.  This would be a noninvasive method of evaluating the small bowel for any underlying lesions.  Colonoscopy with possible intubation of the terminal ileum would be more invasive and put this elderly female at risks of the procedure, when evaluation can be done noninvasively with MR enterography instead.  If MR enterography is normal, her symptoms may have occurred from constipation itself.  High-fiber diet encouraged Linzess 145 mg once daily is helping but is not enough to maintain 1 soft moderate to large sized bowel movement daily.  Therefore will increase to 290 mg once daily.  Patient asked to call us if she has diarrhea.  Recent labs scanned to the chart reviewed and are reassuring.   Over 50% of the time spent coordinating  patient care, evaluating previous records, discussing management plans with patient and family and answering their questions.  Dr Kristine Jacobson  Speech recognition software was used to dictate the above note.

## 2018-05-02 ENCOUNTER — Telehealth: Payer: Self-pay

## 2018-05-02 ENCOUNTER — Other Ambulatory Visit: Payer: Self-pay

## 2018-05-02 ENCOUNTER — Telehealth: Payer: Self-pay | Admitting: Internal Medicine

## 2018-05-02 MED ORDER — LINACLOTIDE 290 MCG PO CAPS: 290.0000 ug | ORAL_CAPSULE | Freq: Every day | ORAL | 0 refills | Status: DC

## 2018-05-02 NOTE — Telephone Encounter (Signed)
LMTCB ok for P[facility] to advise

## 2018-05-02 NOTE — Telephone Encounter (Signed)
I spoke with [name] (dtr) and informed her of the scheduled date for MR Enterography and that her Linzess 290 mcg sent to her mail order. Confirmed that she has enough to hold to her until her new Rx arrives of her 145 mcg.  MRI: 05/13/18 at [facility]. To arrive at 9:30 am, (2) hrs prior to test to drink contrast [place] medical mall. To go to registration desk. Nothing to eat or drink after midnight.

## 2018-05-02 NOTE — Telephone Encounter (Signed)
Doxycycline was previously filled by a historical provider.  Directions state to take daily at lunch for UTI prevention.  Ok to fill?

## 2018-05-02 NOTE — Telephone Encounter (Signed)
She will need to call [facility] urology to discuss this with them for refills on this medication for UTI prevention as Dr. Jacqlyn Larsen prior urologist was giving this to her  [place]

## 2018-05-02 NOTE — Telephone Encounter (Signed)
Copied from [place] [id number]. Topic: Quick Communication - Rx Refill/Question >> May 02, 2018  8:06 AM Rayann Heman wrote: Medication: doxycycline (VIBRAMYCIN) 100 MG capsule [[id number]]   Has the patient contacted their pharmacy? no Preferred Pharmacy (with phone number or street name): [id number] [address], [place] [phone] (Phone) [phone] (Fax)   Agent: Please be advised that RX refills may take up to 3 business days. We ask that you follow-up with your pharmacy.

## 2018-05-02 NOTE — Telephone Encounter (Signed)
Daughter called back and knows they need to call [facility] urology.  She does not know who called for this refill.  [facility] is getting her meds messed up. Nothing further needed at this time.

## 2018-05-04 ENCOUNTER — Other Ambulatory Visit: Payer: Self-pay | Admitting: Internal Medicine

## 2018-05-04 DIAGNOSIS — I10 Essential (primary) hypertension: Secondary | ICD-10-CM

## 2018-05-04 MED ORDER — AMLODIPINE BESYLATE 5 MG PO TABS: 5.0000 mg | ORAL_TABLET | Freq: Every day | ORAL | 2 refills | Status: DC

## 2018-05-04 NOTE — Telephone Encounter (Signed)
Patient has appointment in April Requested Prescriptions  Pending Prescriptions Disp Refills  . amLODipine (NORVASC) 5 MG tablet 30 tablet 2    Sig: Take 1 tablet (5 mg total) by mouth daily.     Cardiovascular:  Calcium Channel Blockers Failed - 05/04/2018  2:06 PM      Failed - Last BP in normal range    BP Readings from Last 1 Encounters:  04/28/18 (!) 144/61         Passed - Valid encounter within last 6 months    Recent Outpatient Visits          3 weeks ago Essential hypertension   [place] McLean-Scocuzza, Nino Glow, MD   4 months ago Essential hypertension   [place] McLean-Scocuzza, Nino Glow, MD   8 months ago Ulcer of left lower extremity, unspecified ulcer stage [facility])   [place] McLean-Scocuzza, Nino Glow, MD   9 months ago Essential hypertension   [place] McLean-Scocuzza, Nino Glow, MD   10 months ago Left hip pain   [facility] McLean-Scocuzza, Nino Glow, MD      Future Appointments            In 2 months Tahiliani, Lennette Bihari, MD [place]   In 2 months McLean-Scocuzza, Nino Glow, MD [facility], [facility]

## 2018-05-04 NOTE — Telephone Encounter (Signed)
Copied from [place] [id number]. Topic: Quick Communication - Rx Refill/Question >> May 04, 2018  2:02 PM Rayann Heman wrote: Medication: amLODipine (NORVASC) 5 MG tablet [[id number]]   Has the patient contacted their pharmacy? no Preferred Pharmacy (with phone number or street name): [id number] [address], [place] [phone] (Phone) [phone] (Fax)  Agent: Please be advised that RX refills may take up to 3 business days. We ask that you follow-up with your pharmacy.

## 2018-05-05 DIAGNOSIS — Z79899 Other long term (current) drug therapy: Secondary | ICD-10-CM | POA: Diagnosis not present

## 2018-05-05 DIAGNOSIS — K580 Irritable bowel syndrome with diarrhea: Secondary | ICD-10-CM | POA: Diagnosis not present

## 2018-05-05 DIAGNOSIS — K5909 Other constipation: Secondary | ICD-10-CM | POA: Diagnosis not present

## 2018-05-05 DIAGNOSIS — K56699 Other intestinal obstruction unspecified as to partial versus complete obstruction: Secondary | ICD-10-CM | POA: Diagnosis not present

## 2018-05-05 DIAGNOSIS — I10 Essential (primary) hypertension: Secondary | ICD-10-CM | POA: Diagnosis not present

## 2018-05-05 DIAGNOSIS — K5651 Intestinal adhesions [bands], with partial obstruction: Secondary | ICD-10-CM | POA: Diagnosis not present

## 2018-05-12 ENCOUNTER — Ambulatory Visit: Payer: Medicare Other

## 2018-05-13 ENCOUNTER — Ambulatory Visit
Admission: RE | Admit: 2018-05-13 | Discharge: 2018-05-13 | Disposition: A | Discharge status: Home / Self Care | Payer: Medicare Other | Source: Ambulatory Visit | Attending: Gastroenterology | Admitting: Gastroenterology

## 2018-05-13 DIAGNOSIS — K5730 Diverticulosis of large intestine without perforation or abscess without bleeding: Secondary | ICD-10-CM | POA: Diagnosis not present

## 2018-05-13 DIAGNOSIS — K529 Noninfective gastroenteritis and colitis, unspecified: Secondary | ICD-10-CM | POA: Diagnosis not present

## 2018-05-13 DIAGNOSIS — K56609 Unspecified intestinal obstruction, unspecified as to partial versus complete obstruction: Secondary | ICD-10-CM | POA: Diagnosis not present

## 2018-05-13 MED ORDER — GADOBUTROL 1 MMOL/ML IV SOLN
6.0000 mL | Freq: Once | INTRAVENOUS | Status: AC | PRN
  Administered 2018-05-13: 6 mL via INTRAVENOUS

## 2018-05-24 ENCOUNTER — Telehealth: Payer: Self-pay

## 2018-05-24 ENCOUNTER — Other Ambulatory Visit: Payer: Self-pay | Admitting: Internal Medicine

## 2018-05-24 DIAGNOSIS — I10 Essential (primary) hypertension: Secondary | ICD-10-CM

## 2018-05-24 MED ORDER — AMLODIPINE BESYLATE 5 MG PO TABS: 5.0000 mg | ORAL_TABLET | Freq: Every day | ORAL | 2 refills | Status: DC

## 2018-05-24 MED ORDER — LOSARTAN POTASSIUM-HCTZ 100-25 MG PO TABS: 1.0000 | ORAL_TABLET | Freq: Every day | ORAL | 1 refills | Status: DC

## 2018-05-24 NOTE — Telephone Encounter (Signed)
Copied from [place] [id number]. Topic: Quick Communication - Rx Refill/Question >> May 24, 2018  4:28 PM Ahmed Prima L wrote: Medication: amLODipine (NORVASC) 5 MG tablet - losartan-hydrochlorothiazide (HYZAAR) 100-25 MG tablet  Has the patient contacted their pharmacy? Yes (Agent: If no, request that the patient contact the pharmacy for the refill.) (Agent: If yes, when and what did the pharmacy advise?)  Preferred Pharmacy (with phone number or street name): [facility] - [address], [place] [address] Phone: [phone] Fax: [phone]    Agent: Please be advised that RX refills may take up to 3 business days. We ask that you follow-up with your pharmacy.

## 2018-05-24 NOTE — Telephone Encounter (Signed)
Contacted patient to inform of her MRI results.  Her daughter [name], took the call.  I informed [name] that her mothers MRI did not show any evidence of inflammation or concerning changes.  She has been advised that her mother needs to continue Linzess as advised.  [name] states Dr. Bonna Gains advised twice a day.  She also said that the medication is showing some improvement with her mothers bowel habits.  I informed [name]   That if her mothers symptoms do not recur we will continue the Linzess for management of her bowels and follow up with [place] in April as scheduled.  [name] appreciated the good news regarding her mother's MRI results, and has been asked to call us at anytime if she notices any new changes.  Thanks [employer]

## 2018-05-24 NOTE — Telephone Encounter (Signed)
-----   Message from Virgel Manifold, MD sent at 05/16/2018 11:10 AM EST ----- Jackelyn Poling please let patient know, her MRI did not show any evidence of inflammation or concerning changes.  Continue to take medication for constipation to avoid constipation.  If her symptoms do not recur, we can continue this management.  Continue follow-up with me in April.

## 2018-05-25 ENCOUNTER — Other Ambulatory Visit: Payer: Self-pay

## 2018-05-25 DIAGNOSIS — I10 Essential (primary) hypertension: Secondary | ICD-10-CM

## 2018-05-25 MED ORDER — LOSARTAN POTASSIUM-HCTZ 100-25 MG PO TABS: 1.0000 | ORAL_TABLET | Freq: Every day | ORAL | 1 refills | Status: DC

## 2018-05-25 MED ORDER — AMLODIPINE BESYLATE 5 MG PO TABS: 5.0000 mg | ORAL_TABLET | Freq: Every day | ORAL | 2 refills | Status: DC

## 2018-07-01 DIAGNOSIS — L97511 Non-pressure chronic ulcer of other part of right foot limited to breakdown of skin: Secondary | ICD-10-CM | POA: Diagnosis not present

## 2018-07-01 DIAGNOSIS — M79675 Pain in left toe(s): Secondary | ICD-10-CM | POA: Diagnosis not present

## 2018-07-01 DIAGNOSIS — B351 Tinea unguium: Secondary | ICD-10-CM | POA: Diagnosis not present

## 2018-07-01 DIAGNOSIS — M79674 Pain in right toe(s): Secondary | ICD-10-CM | POA: Diagnosis not present

## 2018-07-06 ENCOUNTER — Ambulatory Visit: Payer: Medicare Other | Admitting: Gastroenterology

## 2018-07-07 ENCOUNTER — Telehealth: Payer: Self-pay

## 2018-07-07 NOTE — Telephone Encounter (Signed)
Set up visit   tMS

## 2018-07-07 NOTE — Telephone Encounter (Signed)
Copied from [place] [id number]. Topic: General - Inquiry >> Jul 07, 2018  1:35 PM Scherrie Gerlach wrote: Reason for CRM: Daughter [name] would like Dr Linus Orn to call her after the pt's telephone  appt tomorrow at 11 am. She states pt's memory not that great and she needs to know if any updates or changes.  thanks

## 2018-07-07 NOTE — Telephone Encounter (Signed)
Patient has appointment already.

## 2018-07-08 ENCOUNTER — Ambulatory Visit (INDEPENDENT_AMBULATORY_CARE_PROVIDER_SITE_OTHER): Payer: Medicare Other | Admitting: Internal Medicine

## 2018-07-08 ENCOUNTER — Encounter: Payer: Self-pay | Admitting: Internal Medicine

## 2018-07-08 ENCOUNTER — Other Ambulatory Visit: Payer: Self-pay | Admitting: Gastroenterology

## 2018-07-08 DIAGNOSIS — R4182 Altered mental status, unspecified: Secondary | ICD-10-CM

## 2018-07-08 DIAGNOSIS — R5383 Other fatigue: Secondary | ICD-10-CM

## 2018-07-08 DIAGNOSIS — E538 Deficiency of other specified B group vitamins: Secondary | ICD-10-CM

## 2018-07-08 DIAGNOSIS — Z13220 Encounter for screening for lipoid disorders: Secondary | ICD-10-CM

## 2018-07-08 DIAGNOSIS — R413 Other amnesia: Secondary | ICD-10-CM

## 2018-07-08 DIAGNOSIS — N3000 Acute cystitis without hematuria: Secondary | ICD-10-CM | POA: Diagnosis not present

## 2018-07-08 DIAGNOSIS — Z0000 Encounter for general adult medical examination without abnormal findings: Secondary | ICD-10-CM

## 2018-07-08 DIAGNOSIS — N390 Urinary tract infection, site not specified: Secondary | ICD-10-CM

## 2018-07-08 NOTE — Progress Notes (Signed)
Not has been faxed.

## 2018-07-08 NOTE — Progress Notes (Addendum)
Telephone Note  I connected with [name] (daughter) 1st then [employer]   on 07/08/18 at 11:05 AM EDT by telephone and verified that I am speaking with the correct person using two identifiers.  Location patient: home Location provider:work  Persons participating in the virtual visit: patient, provider, pts daughter  I discussed the limitations of evaluation and management by telemedicine and the availability of in person appointments. The patient expressed understanding and agreed to proceed.   HPI: 1. Called daughter [name] initially who is c/w moms memory as cheryls husband has dementia and she feels mom is exhibiting sx's I.e confused with meds (I.e took 2 pills of linzess 290 in am and 1 in the pm) at times though has a pill organizer and goes by a list, keeps asking same ?s to her like why are we staying at home and repeating herself and asking daughter same ?s. She is not leaving the stove on and able to cook when needed. Daughter reports every time mom goes into hospital memory declines.  2. Daughter c/o mom c/o fatigue as well  3. H/o UTI pt denies sx's and wants to stop PTNS tx's with [facility] urology in [place] does not think working and wants PCP to Rx chronic Doxy but I need to disc with Dr. Merlyn Albert (urology) to see if this is rec. 1st. Pt denies current uti sxs 4. Daughter reports mom still has constipation though on linzess will call Dr. Darene Lamer to disc GI   ROS: See pertinent positives and negatives per HPI.  Past Medical History:  Diagnosis Date  . Allergy    seasonal   . Arthritis    shoulders, hands  . Cancer ([place])    [place] face-Dr. Evorn Gong   . Chronic neck pain    2/2 bulging discs   . HBP (high blood pressure)   . Hearing aid worn    bilateral  . Macular degeneration    dx'ed 2015 dry now left wet and right mild wet [facility] Dr. Michelene Heady  . Macular degeneration, age related   . Osteoporosis   . Overactive bladder   . Restless leg   . SBO (small bowel  obstruction) ([place])    01/2017, 02/2018  . Spinal stenosis   . Urinary incontinence    s/p monthly PTNS with Dr. Jacqlyn Larsen, no help with overactive bladder with meds nor Botox   . UTI (urinary tract infection)   . Vertigo    no episodes for several years  . Wears dentures    partial upper and lower    Past Surgical History:  Procedure Laterality Date  . ABDOMINAL HYSTERECTOMY     1967  . APPENDECTOMY     1967  . BLADDER SURGERY     bladder suspension; 2005, 2011-Dr. Cope  . BREAST EXCISIONAL BIOPSY Right 1970   neg  . BROW LIFT Bilateral 12/01/2016   Procedure: BLEPHAROPLASTY upper eyelid with excess skin;  Surgeon: Karle Starch, MD;  Location: [place];  Service: Ophthalmology;  Laterality: Bilateral;  MAC  . CARPAL TUNNEL RELEASE     right 2016   . Esophageal dilitation    . eyelid surgery     2018  . FRACTURE SURGERY    . HIP SURGERY     left 2016   . INTRAMEDULLARY (IM) NAIL INTERTROCHANTERIC Left 02/27/2015   Procedure: INTRAMEDULLARY (IM) NAIL INTERTROCHANTRIC;  Surgeon: Dereck Leep, MD;  Location: [facility];  Service: Orthopedics;  Laterality: Left;  . PTOSIS REPAIR  Bilateral 12/01/2016   Procedure: PTOSIS REPAIR  repair resect ex;  Surgeon: Karle Starch, MD;  Location: [place];  Service: Ophthalmology;  Laterality: Bilateral;  . TONSILLECTOMY      Family History  Problem Relation Age of Onset  . Diabetes Mother   . Arthritis Mother   . Stroke Mother   . Heart disease Father   . Diabetes Sister   . Cancer Brother        lung 2/2 asbestos   . Diabetes Daughter   . Cancer Daughter        DCIS breast   . Diabetes Brother   . Diabetes Brother   . Breast cancer Neg Hx     SOCIAL HX: daughter [name] lives alone    Current Outpatient Medications:  .  acetaminophen (TYLENOL) 500 MG tablet, Take 1,000 mg by mouth 2 (two) times daily as needed (pain). , Disp: , Rfl:  .  amLODipine (NORVASC) 5 MG tablet, Take 1 tablet (5 mg total) by mouth  daily., Disp: 30 tablet, Rfl: 2 .  aspirin EC 81 MG tablet, Take 81 mg by mouth at bedtime., Disp: , Rfl:  .  bisacodyl (BISACODYL) 5 MG EC tablet, Take 5 mg by mouth 2 (two) times a week. Take 1 tablet (5 mg) by mouth on Sunday and Wednesday nights (the night before Linzess dose), Disp: , Rfl:  .  Calcium Carb-Cholecalciferol (CALCIUM 600 + D PO), Take 600 mg by mouth 2 (two) times daily., Disp: , Rfl:  .  Calcium Carbonate-Vitamin D (CALTRATE 600+D) 600-400 MG-UNIT tablet, Take by mouth., Disp: , Rfl:  .  carvedilol (COREG) 6.25 MG tablet, Take 1 tablet (6.25 mg total) by mouth 2 (two) times daily with a meal., Disp: 180 tablet, Rfl: 3 .  doxycycline (VIBRAMYCIN) 100 MG capsule, Take 100 mg by mouth daily with lunch. Continuous course for UTI prevention, Disp: , Rfl:  .  furosemide (LASIX) 20 MG tablet, Take 1 tablet (20 mg total) by mouth every other day as needed. Leg edema prn (Patient not taking: Reported on 12/24/2017), Disp: 10 tablet, Rfl: 0 .  gabapentin (NEURONTIN) 100 MG capsule, Take 2 capsules (200 mg total) by mouth 3 (three) times daily., Disp: 540 capsule, Rfl: 3 .  hydrALAZINE (APRESOLINE) 25 MG tablet, Take 1 tablet (25 mg total) by mouth 2 (two) times daily., Disp: 180 tablet, Rfl: 3 .  linaclotide (LINZESS) 290 MCG CAPS capsule, Take 1 capsule (290 mcg total) by mouth daily before breakfast., Disp: 90 capsule, Rfl: 0 .  loratadine (CLARITIN) 10 MG tablet, Take 10 mg by mouth as needed., Disp: , Rfl:  .  losartan-hydrochlorothiazide (HYZAAR) 100-25 MG tablet, Take 1 tablet by mouth daily., Disp: 90 tablet, Rfl: 1 .  Multiple Vitamin (MULTIVITAMIN WITH MINERALS) TABS tablet, Take 1 tablet by mouth daily. Centrum Silver, Disp: , Rfl:  .  Multiple Vitamins-Minerals (PRESERVISION AREDS 2) CAPS, Take 1 capsule by mouth 2 (two) times daily., Disp: , Rfl:  .  Propylene Glycol (SYSTANE BALANCE OP), Place 1 drop into both eyes 3 (three) times daily as needed (dry eyes)., Disp: , Rfl:  .   rOPINIRole (REQUIP) 0.5 MG tablet, TAKE 1 TABLET BY MOUTH IN THE MORNING, 1 TABLET BY MOUTH AT LUNCH, AND 1 TABLET BY MOUTH IN THE EVENING., Disp: 270 tablet, Rfl: 3 .  traMADol (ULTRAM) 50 MG tablet, Take 1 tablet (50 mg total) by mouth every 6 (six) hours as needed for moderate pain. (Patient taking  differently: Take 50 mg by mouth daily as needed for moderate pain. ), Disp: 16 tablet, Rfl: 0  EXAM:  VITALS per patient if applicable:  GENERAL: alert, oriented, appears well and in no acute distress  PSYCH/NEURO: pleasant and cooperative, no obvious depression or anxiety, speech and thought processing grossly intact  ASSESSMENT AND PLAN:  Discussed the following assessment and plan:  Fatigue, unspecified type - Plan: Comprehensive metabolic panel, CBC w/Diff, TSH  Acute cystitis without hematuria - Plan: Urinalysis, Routine w reflex microscopic, Urine Culture -will disc with [facility] urology Dr. Dorothyann Peng if she rec chronic doxycycline for UTI prevention pt wants to stop PTNS tx   Memory loss likely age related- Plan: Comprehensive metabolic panel, CBC w/Diff, TSH, Urinalysis, Routine w reflex microscopic, Urine Culture, Vitamin B12 -consider CT head in future and neurology consult in future  -mini cog recall tested 2x and recalled chair, kitchen,picture and 2nd time [place], [place] stated picture 1st 2nd attempt finger which was correct version 4  Daughter will do clock drawing with pt and bring back with labs  HM utd flu  Tdap 2016 need to [place] records pna 23 03/03/17, need to ask about prevnar  Had zostervax disc shingrixtoday and given info  Colonoscopy ? 5 years agoout of age window nowf/u GI Dr. Darene Lamer  S/p hysterectomy out of age window pap  Mammogramrepeat 08/26/17 normal DEXA 12/31/14 femoral neck +osteoporosis, total femoral density osteopenia, spine normal -ordered repeat DEXA not done yet  Dermatologysaw 12/22/17 LN2 and bx left hand will f/u upcoming h/o BCC and  left hand lesion c/w NMSC per pt and daughter     See phone note daughter 08/31/2018  Insurance co c/w nonadherance to RAS medication    I discussed the assessment and treatment plan with the patient. The patient was provided an opportunity to ask questions and all were answered. The patient agreed with the plan and demonstrated an understanding of the instructions.   The patient was advised to call back or seek an in-person evaluation if the symptoms worsen or if the condition fails to improve as anticipated.  Time spent 25 min Delorise Jackson, MD

## 2018-07-11 ENCOUNTER — Telehealth: Payer: Self-pay | Admitting: Gastroenterology

## 2018-07-11 MED ORDER — LINACLOTIDE 290 MCG PO CAPS: 290.0000 ug | ORAL_CAPSULE | Freq: Every day | ORAL | 0 refills | Status: DC

## 2018-07-11 NOTE — Telephone Encounter (Signed)
Rachael called with [employer] asking [place] to call in to their prior approval department @ [phone] prompt # 5 for Linzess 290 mcg caps qty 90. Her current Prior authorization ends on 07-15-2018. When you call You will be ask a couple of questions & then this will allow the authorization to be good  For another year.

## 2018-07-11 NOTE — Telephone Encounter (Signed)
Pt daughter is calling to check on rx Linzess refill she was informed we received a fax for this requests today from Pharmacy

## 2018-07-11 NOTE — Telephone Encounter (Signed)
I spoke with Hassan Rowan at [facility] in regards to prior auth for Linzess 290 mcg, qty 90 and was given her clinical history. She stated that this will be reviewed but should be approved. She will contact office to let us know. I have also contacted [name]), pts daughter to give her the information. She did mention that she only had 5 pills left and is not sure it will be enough to last. She is in agreement to send in a 7 day supply to local pharmacy ([place]).

## 2018-07-11 NOTE — Telephone Encounter (Signed)
Received a fax from [employer] [address] [address]  [phone] for rx LINZESS 290 [place]

## 2018-07-12 ENCOUNTER — Telehealth: Payer: Self-pay | Admitting: Gastroenterology

## 2018-07-12 NOTE — Telephone Encounter (Signed)
Received a fax from [place] rx Linzess 290 mg was approved

## 2018-07-12 NOTE — Telephone Encounter (Signed)
Left message for daughter that her mother's Linzess approved. If any further questions, please contact office.

## 2018-07-14 ENCOUNTER — Other Ambulatory Visit (INDEPENDENT_AMBULATORY_CARE_PROVIDER_SITE_OTHER): Payer: Medicare Other

## 2018-07-14 ENCOUNTER — Other Ambulatory Visit: Payer: Self-pay

## 2018-07-14 DIAGNOSIS — R413 Other amnesia: Secondary | ICD-10-CM

## 2018-07-14 DIAGNOSIS — N3000 Acute cystitis without hematuria: Secondary | ICD-10-CM

## 2018-07-14 DIAGNOSIS — Z13220 Encounter for screening for lipoid disorders: Secondary | ICD-10-CM | POA: Diagnosis not present

## 2018-07-14 DIAGNOSIS — E538 Deficiency of other specified B group vitamins: Secondary | ICD-10-CM

## 2018-07-14 DIAGNOSIS — R5383 Other fatigue: Secondary | ICD-10-CM | POA: Diagnosis not present

## 2018-07-14 LAB — COMPREHENSIVE METABOLIC PANEL
ALT: 18 U/L (ref 0–35)
AST: 27 U/L (ref 0–37)
Albumin: 4.2 g/dL (ref 3.5–5.2)
Alkaline Phosphatase: 65 U/L (ref 39–117)
BUN: 18 mg/dL (ref 6–23)
CO2: 27 mEq/L (ref 19–32)
Calcium: 9.7 mg/dL (ref 8.4–10.5)
Chloride: 103 mEq/L (ref 96–112)
Creatinine, Ser: 0.8 mg/dL (ref 0.40–1.20)
GFR: 66.9 mL/min (ref >60.00)
Glucose, Bld: 98 mg/dL (ref 70–99)
Potassium: 3.9 mEq/L (ref 3.5–5.1)
Sodium: 141 mEq/L (ref 135–145)
Total Bilirubin: 0.4 mg/dL (ref 0.2–1.2)
Total Protein: 7.4 g/dL (ref 6.0–8.3)

## 2018-07-14 LAB — CBC WITH DIFFERENTIAL/PLATELET
Basophils Absolute: 0 10*3/uL (ref 0.0–0.1)
Basophils Relative: 1 % (ref 0.0–3.0)
Eosinophils Absolute: 0.1 10*3/uL (ref 0.0–0.7)
Eosinophils Relative: 1.9 % (ref 0.0–5.0)
HCT: 39.2 % (ref 36.0–46.0)
Hemoglobin: 13.3 g/dL (ref 12.0–15.0)
Lymphocytes Relative: 31.2 % (ref 12.0–46.0)
Lymphs Abs: 1.5 10*3/uL (ref 0.7–4.0)
MCHC: 33.9 g/dL (ref 30.0–36.0)
MCV: 87 fl (ref 78.0–100.0)
Monocytes Absolute: 0.5 10*3/uL (ref 0.1–1.0)
Monocytes Relative: 10.3 % (ref 3.0–12.0)
Neutro Abs: 2.7 10*3/uL (ref 1.4–7.7)
Neutrophils Relative %: 55.6 % (ref 43.0–77.0)
Platelets: 176 10*3/uL (ref 150.0–400.0)
RBC: 4.51 Mil/uL (ref 3.87–5.11)
RDW: 13.8 % (ref 11.5–15.5)
WBC: 4.9 10*3/uL (ref 4.0–10.5)

## 2018-07-14 LAB — LIPID PANEL
Cholesterol: 204 mg/dL — ABNORMAL HIGH (ref 0–200)
HDL: 49.7 mg/dL (ref >39.00)
LDL Cholesterol: 124 mg/dL — ABNORMAL HIGH (ref 0–99)
NonHDL: 154.04
Total CHOL/HDL Ratio: 4
Triglycerides: 150 mg/dL — ABNORMAL HIGH (ref 0.0–149.0)
VLDL: 30 mg/dL (ref 0.0–40.0)

## 2018-07-14 LAB — VITAMIN B12: Vitamin B-12: 719 pg/mL (ref 211–911)

## 2018-07-14 LAB — TSH: TSH: 3.74 u[IU]/mL (ref 0.35–4.50)

## 2018-07-14 NOTE — Addendum Note (Signed)
Addended by: Arby Barrette on: 07/14/2018 09:14 AM   Modules accepted: Orders

## 2018-07-18 ENCOUNTER — Other Ambulatory Visit: Payer: Self-pay | Admitting: Internal Medicine

## 2018-07-18 DIAGNOSIS — N3000 Acute cystitis without hematuria: Secondary | ICD-10-CM

## 2018-07-18 LAB — URINE CULTURE

## 2018-07-18 MED ORDER — NITROFURANTOIN MONOHYD MACRO 100 MG PO CAPS: 100.0000 mg | ORAL_CAPSULE | Freq: Two times a day (BID) | ORAL | 0 refills | Status: DC

## 2018-07-22 ENCOUNTER — Encounter (INDEPENDENT_AMBULATORY_CARE_PROVIDER_SITE_OTHER): Payer: Medicare Other

## 2018-07-22 ENCOUNTER — Ambulatory Visit (INDEPENDENT_AMBULATORY_CARE_PROVIDER_SITE_OTHER): Payer: Medicare Other | Admitting: Vascular Surgery

## 2018-07-22 ENCOUNTER — Ambulatory Visit: Payer: Medicare Other | Admitting: Cardiovascular Disease

## 2018-07-29 DIAGNOSIS — H353221 Exudative age-related macular degeneration, left eye, with active choroidal neovascularization: Secondary | ICD-10-CM | POA: Diagnosis not present

## 2018-08-16 ENCOUNTER — Other Ambulatory Visit: Payer: Self-pay | Admitting: Internal Medicine

## 2018-08-16 ENCOUNTER — Telehealth: Payer: Self-pay | Admitting: Internal Medicine

## 2018-08-16 DIAGNOSIS — N3000 Acute cystitis without hematuria: Secondary | ICD-10-CM

## 2018-08-16 DIAGNOSIS — R600 Localized edema: Secondary | ICD-10-CM

## 2018-08-16 DIAGNOSIS — I10 Essential (primary) hypertension: Secondary | ICD-10-CM

## 2018-08-16 MED ORDER — FUROSEMIDE 20 MG PO TABS: 20.0000 mg | ORAL_TABLET | ORAL | 2 refills | Status: DC | PRN

## 2018-08-16 MED ORDER — AMLODIPINE BESYLATE 5 MG PO TABS: 5.0000 mg | ORAL_TABLET | Freq: Every day | ORAL | 3 refills | Status: DC

## 2018-08-16 NOTE — Telephone Encounter (Signed)
Urine orders in  1. Needs to schedule lab appt  2. And virtual appt with me and follow up with urology in [place] as Donnald Garre previously advised for chronic UTI   [place]

## 2018-08-16 NOTE — Telephone Encounter (Signed)
The patient is needing a urine culture and urinalysis done she thinks she has a UTI. Can she have orders put in for today.

## 2018-08-16 NOTE — Telephone Encounter (Signed)
Patients daughter has been informed, appointments have been scheduled.

## 2018-08-17 ENCOUNTER — Other Ambulatory Visit: Payer: Medicare Other

## 2018-08-17 ENCOUNTER — Ambulatory Visit: Payer: Medicare Other | Admitting: Internal Medicine

## 2018-08-19 ENCOUNTER — Encounter (INDEPENDENT_AMBULATORY_CARE_PROVIDER_SITE_OTHER): Payer: Medicare Other

## 2018-08-19 ENCOUNTER — Ambulatory Visit (INDEPENDENT_AMBULATORY_CARE_PROVIDER_SITE_OTHER): Payer: Medicare Other | Admitting: Vascular Surgery

## 2018-08-31 ENCOUNTER — Ambulatory Visit: Payer: Medicare Other | Admitting: Gastroenterology

## 2018-08-31 ENCOUNTER — Other Ambulatory Visit: Payer: Self-pay | Admitting: Internal Medicine

## 2018-08-31 ENCOUNTER — Telehealth: Payer: Self-pay

## 2018-08-31 NOTE — Addendum Note (Signed)
Addended by: Orland Mustard on: 08/31/2018 12:13 PM   Modules accepted: Orders

## 2018-08-31 NOTE — Telephone Encounter (Signed)
Copied from [place] [id number]. Topic: General - Inquiry >> Aug 31, 2018  8:53 AM Mathis Bud wrote: Reason for CRM: Patient daughter Waldron Session) called stating she would like to speak with patient PCP. [name] stated her mother is not doing so well, she is not eating right, becoming forgetful.   Cheryl call back # [phone]

## 2018-08-31 NOTE — Telephone Encounter (Signed)
Daughter [name] called today on behalf of concern for pt x 2 weeks she will call patient 2x per day and she is good in am and in the pm c/o feeling weak, she notices she is foggy in the brain or confused/tired and pt is c/o body aches and low body temp though her temp read 61F on thermometer she though w/o glasses read 93 F. Her complaints go from none to complaining of multiple bodily issues. Daughter [name] thinks pt is more forgetful, not eating well, had trouble remembering, not eating enough protein or carbs or drinking enough water  Daughter does not feel mom is anxious or depressed but concerned that low interaction with COVID 19 could be causing some of above forementioned issues.  Also her mom tells her she will stay in house coat all day which is not like her normally.  This seems like a change and daughter is concerned.    She also reports her sister Neoma Laming will also call the patient and Shanyiah will give her another story on how she feels   Daughter cheryl also reports her mom is more belligerent/argumentive and argues about her paying bills and have $ she didn't spend as [employer] her finances and it took her 45 minutes and explaining 4x about this particular finance issue   A/P  Change in mental status ? Dementia r/o infectious etiology I.e UTI with h/o chronic UTI Mom is not c/o GI issues and had CXR 02/2018 [facility] normal  Plan  Check CBC, UA, urine culture, blood cultures [facility] [address]   Daughter will consider neurology CT head in future daughter has trouble getting pt out of house for appts now  Will text daughter code to try to activate moms my chart and will also sent PHq 9 and MOCA and mini cog for daughter to do at home with her mom   [place]

## 2018-09-01 ENCOUNTER — Telehealth: Payer: Self-pay

## 2018-09-01 ENCOUNTER — Telehealth: Payer: Self-pay | Admitting: Internal Medicine

## 2018-09-01 NOTE — Telephone Encounter (Signed)
Copied from [place] [phone]. Topic: General - Inquiry >> Aug 31, 2018  8:53 AM Mathis Bud wrote: Reason for CRM: Patient daughter Waldron Session) called stating she would like to speak with patient PCP. [name] stated her mother is not doing so well, she is not eating right, becoming forgetful.   Cheryl call back # [phone]

## 2018-09-01 NOTE — Telephone Encounter (Signed)
Pt daughter would like to be called after pt appt. Due to pt being forgetful. Please and Thank you!  Call daughter @ [phone].

## 2018-09-02 ENCOUNTER — Other Ambulatory Visit: Payer: Self-pay

## 2018-09-02 ENCOUNTER — Ambulatory Visit (INDEPENDENT_AMBULATORY_CARE_PROVIDER_SITE_OTHER): Payer: Medicare Other | Admitting: Internal Medicine

## 2018-09-02 ENCOUNTER — Telehealth: Payer: Self-pay

## 2018-09-02 ENCOUNTER — Ambulatory Visit
Admission: RE | Admit: 2018-09-02 | Discharge: 2018-09-02 | Disposition: A | Discharge status: Home / Self Care | Payer: Medicare Other | Source: Ambulatory Visit | Attending: Internal Medicine | Admitting: Internal Medicine

## 2018-09-02 ENCOUNTER — Encounter: Payer: Self-pay | Admitting: Internal Medicine

## 2018-09-02 DIAGNOSIS — R4182 Altered mental status, unspecified: Secondary | ICD-10-CM | POA: Diagnosis not present

## 2018-09-02 DIAGNOSIS — R0602 Shortness of breath: Secondary | ICD-10-CM | POA: Diagnosis not present

## 2018-09-02 DIAGNOSIS — R05 Cough: Secondary | ICD-10-CM

## 2018-09-02 DIAGNOSIS — R059 Cough, unspecified: Secondary | ICD-10-CM

## 2018-09-02 MED ORDER — BENZONATATE 100 MG PO CAPS: 100.0000 mg | ORAL_CAPSULE | Freq: Three times a day (TID) | ORAL | 0 refills | Status: DC | PRN

## 2018-09-02 NOTE — Progress Notes (Addendum)
Telephone Note  I connected with Waldron Session and Tenzin Malinoski at different times   on 09/02/18 at 11:45  AM EDT by telephone and verified that I am speaking with the correct person using two identifiers.  Location patient: home Location provider:work  Persons participating in the virtual visit: patient, provider  I discussed the limitations of evaluation and management by telemedicine and the availability of in person appointments. The patient expressed understanding and agreed to proceed.   HPI: 1. Initially called daughter [name] who was concerned she has been statin her mom is more confused recently and this am mom c/o LOC/slept in her recliner and wasn't sure if she passed out and was having slurred speech also mom has been having a cough x a few nights She is c/w moms change in health   Talked with Mrs. Morr today who states she does not think she passed out but has had a hackey cough x a few nights which is dry she tried Mucinex but she is out. Denies GERD. She has seasonal allergies but does not feel like they are bad. Denies fever, sick contacts. She had a sore throat 1 week ago which is improved. She does have sob with exertion but is able to lie flat at night. She denies leg swelling but has chronic pain in left leg x years.    She has been on Macrobid for chronic UTI which is new and I made pt aware this can have side effect of cough    ROS: See pertinent positives and negatives per HPI. MSK: no joint pain   Past Medical History:  Diagnosis Date  . Allergy    seasonal   . Arthritis    shoulders, hands  . Cancer ([place])    [place] face-Dr. Evorn Gong   . Chronic neck pain    2/2 bulging discs   . HBP (high blood pressure)   . Hearing aid worn    bilateral  . Macular degeneration    dx'ed 2015 dry now left wet and right mild wet [facility] Dr. Michelene Heady  . Macular degeneration, age related   . Osteoporosis   . Overactive bladder   . Restless leg   . SBO (small bowel  obstruction) ([place])    01/2017, 02/2018  . Spinal stenosis   . Urinary incontinence    s/p monthly PTNS with Dr. Jacqlyn Larsen, no help with overactive bladder with meds nor Botox   . UTI (urinary tract infection)   . Vertigo    no episodes for several years  . Wears dentures    partial upper and lower    Past Surgical History:  Procedure Laterality Date  . ABDOMINAL HYSTERECTOMY     1967  . APPENDECTOMY     1967  . BLADDER SURGERY     bladder suspension; 2005, 2011-Dr. Cope  . BREAST EXCISIONAL BIOPSY Right 1970   neg  . BROW LIFT Bilateral 12/01/2016   Procedure: BLEPHAROPLASTY upper eyelid with excess skin;  Surgeon: Karle Starch, MD;  Location: [facility];  Service: Ophthalmology;  Laterality: Bilateral;  MAC  . CARPAL TUNNEL RELEASE     right 2016   . Esophageal dilitation    . eyelid surgery     2018  . FRACTURE SURGERY    . HIP SURGERY     left 2016   . INTRAMEDULLARY (IM) NAIL INTERTROCHANTERIC Left 02/27/2015   Procedure: INTRAMEDULLARY (IM) NAIL INTERTROCHANTRIC;  Surgeon: Dereck Leep, MD;  Location: [facility];  Service: Orthopedics;  Laterality: Left;  . PTOSIS REPAIR Bilateral 12/01/2016   Procedure: PTOSIS REPAIR  repair resect ex;  Surgeon: Karle Starch, MD;  Location: [place];  Service: Ophthalmology;  Laterality: Bilateral;  . TONSILLECTOMY      Family History  Problem Relation Age of Onset  . Diabetes Mother   . Arthritis Mother   . Stroke Mother   . Heart disease Father   . Diabetes Sister   . Cancer Brother        lung 2/2 asbestos   . Diabetes Daughter   . Cancer Daughter        DCIS breast   . Diabetes Brother   . Diabetes Brother   . Breast cancer Neg Hx     SOCIAL HX: lives alone    Current Outpatient Medications:  .  acetaminophen (TYLENOL) 500 MG tablet, Take 1,000 mg by mouth 2 (two) times daily as needed (pain). , Disp: , Rfl:  .  amLODipine (NORVASC) 5 MG tablet, Take 1 tablet (5 mg total) by mouth daily., Disp: 90  tablet, Rfl: 3 .  aspirin EC 81 MG tablet, Take 81 mg by mouth at bedtime., Disp: , Rfl:  .  benzonatate (TESSALON) 100 MG capsule, Take 1 capsule (100 mg total) by mouth 3 (three) times daily as needed for cough., Disp: 30 capsule, Rfl: 0 .  bisacodyl (BISACODYL) 5 MG EC tablet, Take 5 mg by mouth 2 (two) times a week. Take 1 tablet (5 mg) by mouth on Sunday and Wednesday nights (the night before Linzess dose), Disp: , Rfl:  .  Calcium Carb-Cholecalciferol (CALCIUM 600 + D PO), Take 600 mg by mouth 2 (two) times daily., Disp: , Rfl:  .  Calcium Carbonate-Vitamin D (CALTRATE 600+D) 600-400 MG-UNIT tablet, Take by mouth., Disp: , Rfl:  .  carvedilol (COREG) 6.25 MG tablet, Take 1 tablet (6.25 mg total) by mouth 2 (two) times daily with a meal., Disp: 180 tablet, Rfl: 3 .  furosemide (LASIX) 20 MG tablet, Take 1 tablet (20 mg total) by mouth every other day as needed. Leg edema prn, Disp: 30 tablet, Rfl: 2 .  gabapentin (NEURONTIN) 100 MG capsule, Take 2 capsules (200 mg total) by mouth 3 (three) times daily., Disp: 540 capsule, Rfl: 3 .  hydrALAZINE (APRESOLINE) 25 MG tablet, Take 1 tablet (25 mg total) by mouth 2 (two) times daily., Disp: 180 tablet, Rfl: 3 .  linaclotide (LINZESS) 290 MCG CAPS capsule, Take 1 capsule (290 mcg total) by mouth daily before breakfast., Disp: 7 capsule, Rfl: 0 .  LINZESS 290 MCG CAPS capsule, TAKE 1 CAPSULE BY MOUTH DAILY BEFORE BREAKFAST, Disp: 90 capsule, Rfl: 0 .  loratadine (CLARITIN) 10 MG tablet, Take 10 mg by mouth as needed., Disp: , Rfl:  .  losartan-hydrochlorothiazide (HYZAAR) 100-25 MG tablet, Take 1 tablet by mouth daily., Disp: 90 tablet, Rfl: 1 .  Multiple Vitamin (MULTIVITAMIN WITH MINERALS) TABS tablet, Take 1 tablet by mouth daily. Centrum Silver, Disp: , Rfl:  .  Multiple Vitamins-Minerals (PRESERVISION AREDS 2) CAPS, Take 1 capsule by mouth 2 (two) times daily., Disp: , Rfl:  .  nitrofurantoin, macrocrystal-monohydrate, (MACROBID) 100 MG capsule,  Take 1 capsule (100 mg total) by mouth 2 (two) times daily., Disp: 10 capsule, Rfl: 0 .  Propylene Glycol (SYSTANE BALANCE OP), Place 1 drop into both eyes 3 (three) times daily as needed (dry eyes)., Disp: , Rfl:  .  rOPINIRole (REQUIP) 0.5 MG tablet, TAKE 1 TABLET  BY MOUTH IN THE MORNING, 1 TABLET BY MOUTH AT LUNCH, AND 1 TABLET BY MOUTH IN THE EVENING., Disp: 270 tablet, Rfl: 3 .  traMADol (ULTRAM) 50 MG tablet, Take 1 tablet (50 mg total) by mouth every 6 (six) hours as needed for moderate pain. (Patient taking differently: Take 50 mg by mouth daily as needed for moderate pain. ), Disp: 16 tablet, Rfl: 0  EXAM:  VITALS per patient if applicable:  GENERAL: alert, oriented, appears well and in no acute distress  PSYCH/NEURO: pleasant and cooperative, no obvious depression or anxiety, speech and thought processing grossly intact  ASSESSMENT AND PLAN:  Discussed the following assessment and plan:  Altered mental status, unspecified altered mental status type ?LOC r/o stroke, infection/dementia - Plan: CT Head Wo Contrast, DG Chest 2 View Labs at [facility] today   Cough ? If related bronchitis vs med side effect macrobid vs other (of note h/o LLL nodule noted on CT in 2016)- Plan: DG Chest 2 View, benzonatate (TESSALON) 100 MG capsule tid prn  CC urology to let them know Abx may need to be changed for prevention of UTI as macrobid has side effect of cough  SOB (shortness of breath) on exertion - Plan: DG Chest 2 View Reviewed echo 2016 nl EF grade 1 DD  HM utd flu Tdap 2016 need to verifyprior records pna 23 03/03/17, need to ask about prevnar  Had zostervax disc shingrixtoday and given info  Colonoscopy ? 5 years agoout of age window nowf/u GI Dr. Darene Lamer  S/p hysterectomy out of age window pap  Mammogramrepeat 08/26/17 normal DEXA 12/31/14 femoral neck +osteoporosis, total femoral density osteopenia, spine normal -ordered repeat DEXAnot done yet Dermatologysaw 12/22/17 LN2  and bx left hand will f/u upcoming h/o BCC and left hand lesion c/w NMSC per pt and daughter    See phone note daughter 08/31/2018  Insurance co c/w nonadherance to RAS medication    I discussed the assessment and treatment plan with the patient. The patient was provided an opportunity to ask questions and all were answered. The patient agreed with the plan and demonstrated an understanding of the instructions.   The patient was advised to call back or seek an in-person evaluation if the symptoms worsen or if the condition fails to improve as anticipated.  Time spent 20 minutes  Delorise Jackson, MD

## 2018-09-02 NOTE — Telephone Encounter (Signed)
Copied from [place] [id number]. Topic: General - Other >> Sep 02, 2018 12:14 PM Leward Quan A wrote: Reason for CRM: Patients daughter Waldron Session called stated that someone called her but she was unable to get to the phone Ph# [phone]

## 2018-09-02 NOTE — Telephone Encounter (Signed)
Did not call since earlier

## 2018-09-05 ENCOUNTER — Ambulatory Visit (INDEPENDENT_AMBULATORY_CARE_PROVIDER_SITE_OTHER): Payer: Medicare Other | Admitting: Gastroenterology

## 2018-09-05 ENCOUNTER — Other Ambulatory Visit: Payer: Self-pay

## 2018-09-05 DIAGNOSIS — N390 Urinary tract infection, site not specified: Secondary | ICD-10-CM | POA: Diagnosis not present

## 2018-09-05 DIAGNOSIS — K5900 Constipation, unspecified: Secondary | ICD-10-CM | POA: Diagnosis not present

## 2018-09-05 DIAGNOSIS — R413 Other amnesia: Secondary | ICD-10-CM | POA: Diagnosis not present

## 2018-09-05 DIAGNOSIS — N3000 Acute cystitis without hematuria: Secondary | ICD-10-CM | POA: Diagnosis not present

## 2018-09-05 DIAGNOSIS — R4182 Altered mental status, unspecified: Secondary | ICD-10-CM | POA: Diagnosis not present

## 2018-09-05 NOTE — Progress Notes (Signed)
Kristine Antigua, MD [address]  [place]  [place], [place] [address]  Main: [phone]  Fax: [phone]   Primary Care Physician: McLean-Scocuzza, Nino Glow, MD  Virtual Visit via Video Note  I connected with patient on 09/05/18 at 11:15 AM EDT by video (using [email or web address]) and verified that I am speaking with the correct person using two identifiers.   I discussed the limitations, risks, security and privacy concerns of performing an evaluation and management service by video and the availability of in person appointments. I also discussed with the patient that there may be a patient responsible charge related to this service. The patient expressed understanding and agreed to proceed.  Location of Patient: Home Location of Provider: Home Persons involved: Patient, her daughter and provider only (Nursing staff checked in patient via phone but were not physically involved in the video interaction - see their notes)   History of Present Illness: Chief Complaint  Patient presents with   Follow-up    Enteritis/Linzess    HPI: Kristine Jacobson is a 85 y.o. female with history of chronic constipation here for follow-up.  Patient on Linzess daily with which, patient and family state, she has bowel movements 5 to 6 days out of the week and she does not have to strain.  No blood in stool.  However, this week patient is undergoing work-up by primary care provider for possible UTI.  She also has not had a bowel movement in 4 days but has been passing gas.  They had blood work and urine testing done this morning and results are pending.  Previous history: MRE this year did not show any evidence of IBD or any other abnormalities of the bowel.  This was done due to her previous history of small bowel obstructions in the past to rule out any underlying abnormalities.  Patient was admitted to [facility] in December 2019 with small bowel obstruction managed conservatively and followed by surgery  during that inpatient admission.  CT on that admission showed dilated loops of small bowel to 5.1 cm, fecalization of the small bowel in the right lower quadrant with a transition point in the right lower quadrant.  Mesenteric thickening adjacent to this dilated small bowel loops was also reported.  Prior to this she was admitted in November 2018 with small bowel obstruction as well.  Which reported transition point in the right lower quadrant representing ileus or partial small bowel obstruction.  Enteritis or paralytic ileus was the differential on that CT report.  Patient was managed conservatively during this episode as well.  Patient states leading up to her hospitalizations both time she was constipated for 3 to 4 days and did not have a bowel movement during that time and then ended up having nausea and vomiting.  She has had previous history of abdominal surgeries but her last surgery was 50 years ago.  She does recall a year after her hysterectomy she had severe abdominal pain which led to exploratory laparotomy with lysis of adhesions at that time.  Since then she has been asymptomatic.  Last colonoscopy, 2005 for screening with Dr. Nicolasa Ducking.  This reported excellent prep, extent of exam cecum, normal colon.  Internal hemorrhoids reported.  EGD 2012 by Dr. Vira Agar for dysphagia.  Reported normal esophagus stomach and duodenum.  Reports dilation with savory dilator to 16 mm at the time.  Current Outpatient Medications  Medication Sig Dispense Refill   acetaminophen (TYLENOL) 500 MG tablet Take 1,000 mg by  mouth 2 (two) times daily as needed (pain).      amLODipine (NORVASC) 5 MG tablet Take 1 tablet (5 mg total) by mouth daily. 90 tablet 3   aspirin EC 81 MG tablet Take 81 mg by mouth at bedtime.     benzonatate (TESSALON) 100 MG capsule Take 1 capsule (100 mg total) by mouth 3 (three) times daily as needed for cough. 30 capsule 0   bisacodyl (BISACODYL) 5 MG EC tablet Take 5 mg by  mouth 2 (two) times a week. Take 1 tablet (5 mg) by mouth on Sunday and Wednesday nights (the night before Linzess dose)     Calcium Carb-Cholecalciferol (CALCIUM 600 + D PO) Take 600 mg by mouth 2 (two) times daily.     Calcium Carbonate-Vitamin D (CALTRATE 600+D) 600-400 MG-UNIT tablet Take by mouth.     carvedilol (COREG) 6.25 MG tablet Take 1 tablet (6.25 mg total) by mouth 2 (two) times daily with a meal. 180 tablet 3   furosemide (LASIX) 20 MG tablet Take 1 tablet (20 mg total) by mouth every other day as needed. Leg edema prn 30 tablet 2   gabapentin (NEURONTIN) 100 MG capsule Take 2 capsules (200 mg total) by mouth 3 (three) times daily. 540 capsule 3   hydrALAZINE (APRESOLINE) 25 MG tablet Take 1 tablet (25 mg total) by mouth 2 (two) times daily. 180 tablet 3   linaclotide (LINZESS) 290 MCG CAPS capsule Take 1 capsule (290 mcg total) by mouth daily before breakfast. 7 capsule 0   LINZESS 290 MCG CAPS capsule TAKE 1 CAPSULE BY MOUTH DAILY BEFORE BREAKFAST 90 capsule 0   loratadine (CLARITIN) 10 MG tablet Take 10 mg by mouth as needed.     losartan-hydrochlorothiazide (HYZAAR) 100-25 MG tablet Take 1 tablet by mouth daily. 90 tablet 1   Multiple Vitamin (MULTIVITAMIN WITH MINERALS) TABS tablet Take 1 tablet by mouth daily. Centrum Silver     Multiple Vitamins-Minerals (PRESERVISION AREDS 2) CAPS Take 1 capsule by mouth 2 (two) times daily.     nitrofurantoin, macrocrystal-monohydrate, (MACROBID) 100 MG capsule Take 1 capsule (100 mg total) by mouth 2 (two) times daily. 10 capsule 0   Propylene Glycol (SYSTANE BALANCE OP) Place 1 drop into both eyes 3 (three) times daily as needed (dry eyes).     rOPINIRole (REQUIP) 0.5 MG tablet TAKE 1 TABLET BY MOUTH IN THE MORNING, 1 TABLET BY MOUTH AT LUNCH, AND 1 TABLET BY MOUTH IN THE EVENING. 270 tablet 3   traMADol (ULTRAM) 50 MG tablet Take 1 tablet (50 mg total) by mouth every 6 (six) hours as needed for moderate pain. (Patient taking  differently: Take 50 mg by mouth daily as needed for moderate pain. ) 16 tablet 0   No current facility-administered medications for this visit.     Allergies as of 09/05/2018 - Review Complete 09/02/2018  Allergen Reaction Noted   Codeine Rash 06/08/2013    Review of Systems:    All systems reviewed and negative except where noted in HPI.   Observations/Objective:  Labs: CMP     Component Value Date/Time   NA 141 07/14/2018 0915   K 3.9 07/14/2018 0915   CL 103 07/14/2018 0915   CO2 27 07/14/2018 0915   GLUCOSE 98 07/14/2018 0915   BUN 18 07/14/2018 0915   CREATININE 0.80 07/14/2018 0915   CALCIUM 9.7 07/14/2018 0915   PROT 7.4 07/14/2018 0915   ALBUMIN 4.2 07/14/2018 0915   AST 27 07/14/2018 0915  ALT 18 07/14/2018 0915   ALKPHOS 65 07/14/2018 0915   BILITOT 0.4 07/14/2018 0915   GFRNONAA >60 03/14/2015 0920   GFRAA >60 03/14/2015 0920   Lab Results  Component Value Date   WBC 4.9 07/14/2018   HGB 13.3 07/14/2018   HCT 39.2 07/14/2018   MCV 87.0 07/14/2018   PLT 176.0 07/14/2018    Imaging Studies: Ct Head Wo Contrast  Result Date: 09/02/2018 CLINICAL DATA:  Altered mental status and confusion, worsening for the past month. No known injury. EXAM: CT HEAD WITHOUT CONTRAST TECHNIQUE: Contiguous axial images were obtained from the base of the skull through the vertex without intravenous contrast. COMPARISON:  None. FINDINGS: Brain: Mild-to-moderate enlargement of the ventricles and subarachnoid spaces. Mild-to-moderate patchy white matter low density in both cerebral hemispheres. No intracranial hemorrhage, mass lesion or CT evidence of acute infarction. Vascular: No hyperdense vessel or unexpected calcification. Skull: Normal. Negative for fracture or focal lesion. Sinuses/Orbits: No acute finding. Other: None. IMPRESSION: 1. No acute abnormality. 2. Mild to moderate diffuse cerebral and cerebellar atrophy. 3. Mild to moderate chronic small vessel white matter  ischemic changes in both cerebral hemispheres. Electronically Signed   By: Claudie Revering M.D.   On: 09/02/2018 16:42    Assessment and Plan:   GAE BIHL is a 85 y.o. y/o female with chronic constipation  Assessment and Plan: Linzess is working well for the patient and patient is having bowel movements 5 to 6 days out of the week without straining.  Continue current dose  Patient has not had a bowel movement in 4 days but is undergoing testing for UTI by primary care provider.  These results are pending.  I will follow these up along with PCP notes and can advise the patient via my chart if they should take Dulcolax if she does not have a bowel movement today or tomorrow.  Continue to hydrate well  I would not want to start Dulcolax right now in case she has an underlying UTI in order to prevent dehydration  Follow Up Instructions: Follow-up in 2 weeks   I discussed the assessment and treatment plan with the patient. The patient was provided an opportunity to ask questions and all were answered. The patient agreed with the plan and demonstrated an understanding of the instructions.   The patient was advised to call back or seek an in-person evaluation if the symptoms worsen or if the condition fails to improve as anticipated.  I provided 20 minutes of face-to-face time via video software during this encounter. Additional time was spent in reviewing patient's chart, placing orders etc.   Virgel Manifold, MD  Speech recognition software was used to dictate this note.

## 2018-09-07 ENCOUNTER — Other Ambulatory Visit: Payer: Self-pay | Admitting: Internal Medicine

## 2018-09-07 ENCOUNTER — Ambulatory Visit
Admission: RE | Admit: 2018-09-07 | Discharge: 2018-09-07 | Disposition: A | Discharge status: Home / Self Care | Payer: Medicare Other | Attending: Internal Medicine | Admitting: Internal Medicine

## 2018-09-07 ENCOUNTER — Encounter: Payer: Self-pay | Admitting: Internal Medicine

## 2018-09-07 ENCOUNTER — Ambulatory Visit
Admission: RE | Admit: 2018-09-07 | Discharge: 2018-09-07 | Disposition: A | Discharge status: Home / Self Care | Payer: Medicare Other | Source: Ambulatory Visit | Attending: Internal Medicine | Admitting: Internal Medicine

## 2018-09-07 ENCOUNTER — Other Ambulatory Visit: Payer: Self-pay

## 2018-09-07 DIAGNOSIS — R4182 Altered mental status, unspecified: Secondary | ICD-10-CM

## 2018-09-07 DIAGNOSIS — R05 Cough: Secondary | ICD-10-CM | POA: Insufficient documentation

## 2018-09-07 DIAGNOSIS — B370 Candidal stomatitis: Secondary | ICD-10-CM

## 2018-09-07 DIAGNOSIS — R0602 Shortness of breath: Secondary | ICD-10-CM

## 2018-09-07 DIAGNOSIS — R059 Cough, unspecified: Secondary | ICD-10-CM

## 2018-09-07 MED ORDER — NYSTATIN 100000 UNIT/ML MT SUSP: 5.0000 mL | Freq: Four times a day (QID) | OROMUCOSAL | 0 refills | Status: DC

## 2018-09-11 LAB — URINALYSIS, ROUTINE W REFLEX MICROSCOPIC
Bilirubin, UA: NEGATIVE
Glucose, UA: NEGATIVE
Ketones, UA: NEGATIVE
Leukocytes,UA: NEGATIVE
Nitrite, UA: NEGATIVE
Protein,UA: NEGATIVE
RBC, UA: NEGATIVE
Specific Gravity, UA: 1.015 (ref 1.005–1.030)
Urobilinogen, Ur: 1 mg/dL (ref 0.2–1.0)
pH, UA: 6.5 (ref 5.0–7.5)

## 2018-09-11 LAB — CBC WITH DIFFERENTIAL/PLATELET
Basophils Absolute: 0 10*3/uL (ref 0.0–0.2)
Basos: 1 %
EOS (ABSOLUTE): 0.1 10*3/uL (ref 0.0–0.4)
Eos: 2 %
Hematocrit: 34.3 % (ref 34.0–46.6)
Hemoglobin: 11.3 g/dL (ref 11.1–15.9)
Immature Grans (Abs): 0 10*3/uL (ref 0.0–0.1)
Immature Granulocytes: 0 %
Lymphocytes Absolute: 0.9 10*3/uL (ref 0.7–3.1)
Lymphs: 16 %
MCH: 28.8 pg (ref 26.6–33.0)
MCHC: 32.9 g/dL (ref 31.5–35.7)
MCV: 87 fL (ref 79–97)
Monocytes Absolute: 0.4 10*3/uL (ref 0.1–0.9)
Monocytes: 7 %
Neutrophils Absolute: 4.2 10*3/uL (ref 1.4–7.0)
Neutrophils: 74 %
Platelets: 320 10*3/uL (ref 150–450)
RBC: 3.93 x10E6/uL (ref 3.77–5.28)
RDW: 13.7 % (ref 11.7–15.4)
WBC: 5.7 10*3/uL (ref 3.4–10.8)

## 2018-09-11 LAB — CULTURE, BLOOD (SINGLE)

## 2018-09-11 LAB — URINE CULTURE

## 2018-09-19 ENCOUNTER — Ambulatory Visit (INDEPENDENT_AMBULATORY_CARE_PROVIDER_SITE_OTHER): Payer: Medicare Other | Admitting: Gastroenterology

## 2018-09-19 DIAGNOSIS — K5900 Constipation, unspecified: Secondary | ICD-10-CM

## 2018-09-19 NOTE — Progress Notes (Signed)
Kristine Antigua, MD [address]  [place]  [place], [place] [address]  Main: [phone]  Fax: [phone]   Primary Care Physician: McLean-Scocuzza, Nino Glow, MD  Virtual Visit via Telephone Note  I connected with patient on 09/19/18 at 11:00 AM EDT by telephone and verified that I am speaking with the correct person using two identifiers.   I discussed the limitations, risks, security and privacy concerns of performing an evaluation and management service by telephone and the availability of in person appointments. I also discussed with the patient that there may be a patient responsible charge related to this service. The patient expressed understanding and agreed to proceed.  Location of Patient: Home Location of Provider: Home Persons involved: Patient and provider only during the visit (nursing staff and front desk staff was involved in communicating with the patient prior to the appointment, reviewing medications and checking them in)   History of Present Illness: Chief Complaint  Patient presents with  . Follow-up    constipation     HPI: Kristine Jacobson is a 85 y.o. female with history of chronic constipation being seen for follow-up.  On last visit on June 22, patient had gone 4 days without a bowel movement and was asked to start taking Dulcolax until she has a bowel movement.  She reports good results with this and now is only taking Dulcolax as needed, and she has not had a bowel movement in 2 to 3 days.  Currently reports having a bowel movement without any blood every 1 to 2 days.  Reports improvement in appetite with improvement in constipation.  Continuing to take Linzess daily.  Previous history: MRE this year did not show any evidence of IBD or any other abnormalities of the bowel.  This was done due to her previous history of small bowel obstructions in the past to rule out any underlying abnormalities.  Patient was admitted to [facility] in December 2019  with small bowel obstruction managed conservatively and followed by surgery during that inpatient admission. CT on that admission showed dilated loops of small bowel to 5.1 cm, fecalizationof the small bowel in the right lower quadrant with a transition point in the right lower quadrant. Mesenteric thickening adjacent to this dilated small bowel loops was also reported.  Prior to this she was admitted in November 2018 with small bowel obstruction as well. Which reported transition point in the right lower quadrant representing ileus or partial small bowel obstruction. Enteritis or paralytic ileus was the differential on that CT report. Patient was managed conservatively during this episode as well.  Patient states leading up to her hospitalizations both time she was constipated for 3 to 4 days and did not have a bowel movement during that time and then ended up having nausea and vomiting.  She has had previous history of abdominal surgeries but her last surgery was 50 years ago. She does recall a year after her hysterectomy she had severe abdominal pain which led to exploratory laparotomy with lysis of adhesions at that time. Since then she has been asymptomatic.  Last colonoscopy, 2005 for screening with Dr. Nicolasa Ducking. This reported excellent prep, extent of exam cecum, normal colon. Internal hemorrhoids reported.  EGD 2012 by Dr. Vira Agar for dysphagia. Reported normal esophagus stomach and duodenum. Reports dilation with savory dilator to 16 mm at the time.  Current Outpatient Medications  Medication Sig Dispense Refill  . acetaminophen (TYLENOL) 500 MG tablet Take 1,000 mg by mouth 2 (two)  times daily as needed (pain).     Marland Kitchen amLODipine (NORVASC) 5 MG tablet Take 1 tablet (5 mg total) by mouth daily. 90 tablet 3  . aspirin EC 81 MG tablet Take 81 mg by mouth at bedtime.    . benzonatate (TESSALON) 100 MG capsule Take 1 capsule (100 mg total) by mouth 3 (three) times daily as needed  for cough. 30 capsule 0  . bisacodyl (BISACODYL) 5 MG EC tablet Take 5 mg by mouth 2 (two) times a week. Take 1 tablet (5 mg) by mouth on Sunday and Wednesday nights (the night before Linzess dose)    . Calcium Carb-Cholecalciferol (CALCIUM 600 + D PO) Take 600 mg by mouth 2 (two) times daily.    . Calcium Carbonate-Vitamin D (CALTRATE 600+D) 600-400 MG-UNIT tablet Take by mouth.    . carvedilol (COREG) 6.25 MG tablet Take 1 tablet (6.25 mg total) by mouth 2 (two) times daily with a meal. 180 tablet 3  . furosemide (LASIX) 20 MG tablet Take 1 tablet (20 mg total) by mouth every other day as needed. Leg edema prn 30 tablet 2  . gabapentin (NEURONTIN) 100 MG capsule Take 2 capsules (200 mg total) by mouth 3 (three) times daily. 540 capsule 3  . hydrALAZINE (APRESOLINE) 25 MG tablet Take 1 tablet (25 mg total) by mouth 2 (two) times daily. 180 tablet 3  . linaclotide (LINZESS) 290 MCG CAPS capsule Take 1 capsule (290 mcg total) by mouth daily before breakfast. 7 capsule 0  . LINZESS 290 MCG CAPS capsule TAKE 1 CAPSULE BY MOUTH DAILY BEFORE BREAKFAST 90 capsule 0  . loratadine (CLARITIN) 10 MG tablet Take 10 mg by mouth as needed.    Marland Kitchen losartan-hydrochlorothiazide (HYZAAR) 100-25 MG tablet Take 1 tablet by mouth daily. 90 tablet 1  . Multiple Vitamin (MULTIVITAMIN WITH MINERALS) TABS tablet Take 1 tablet by mouth daily. Centrum Silver    . Multiple Vitamins-Minerals (PRESERVISION AREDS 2) CAPS Take 1 capsule by mouth 2 (two) times daily.    . nitrofurantoin, macrocrystal-monohydrate, (MACROBID) 100 MG capsule Take 1 capsule (100 mg total) by mouth 2 (two) times daily. 10 capsule 0  . nystatin (MYCOSTATIN) 100000 UNIT/ML suspension Take 5 mLs (500,000 Units total) by mouth 4 (four) times daily. Oral thrush. Hold in mouth as long as you can and spit or swallow 473 mL 0  . Propylene Glycol (SYSTANE BALANCE OP) Place 1 drop into both eyes 3 (three) times daily as needed (dry eyes).    Marland Kitchen rOPINIRole (REQUIP)  0.5 MG tablet TAKE 1 TABLET BY MOUTH IN THE MORNING, 1 TABLET BY MOUTH AT LUNCH, AND 1 TABLET BY MOUTH IN THE EVENING. 270 tablet 3  . traMADol (ULTRAM) 50 MG tablet Take 1 tablet (50 mg total) by mouth every 6 (six) hours as needed for moderate pain. (Patient taking differently: Take 50 mg by mouth daily as needed for moderate pain. ) 16 tablet 0   No current facility-administered medications for this visit.     Allergies as of 09/19/2018 - Review Complete 09/02/2018  Allergen Reaction Noted  . Codeine Rash 06/08/2013    Review of Systems:    All systems reviewed and negative except where noted in HPI.   Observations/Objective:  Labs: CMP     Component Value Date/Time   NA 141 07/14/2018 0915   K 3.9 07/14/2018 0915   CL 103 07/14/2018 0915   CO2 27 07/14/2018 0915   GLUCOSE 98 07/14/2018 0915   BUN 18  07/14/2018 0915   CREATININE 0.80 07/14/2018 0915   CALCIUM 9.7 07/14/2018 0915   PROT 7.4 07/14/2018 0915   ALBUMIN 4.2 07/14/2018 0915   AST 27 07/14/2018 0915   ALT 18 07/14/2018 0915   ALKPHOS 65 07/14/2018 0915   BILITOT 0.4 07/14/2018 0915   GFRNONAA >60 03/14/2015 0920   GFRAA >60 03/14/2015 0920   Lab Results  Component Value Date   WBC 5.7 09/05/2018   HGB 11.3 09/05/2018   HCT 34.3 09/05/2018   MCV 87 09/05/2018   PLT 320 09/05/2018    Imaging Studies: Dg Chest 2 View  Result Date: 09/07/2018 CLINICAL DATA:  85 year old with nonproductive cough. EXAM: CHEST - 2 VIEW COMPARISON:  03/02/2015 and CT from 03/03/2015 FINDINGS: Slightly decreased lung volumes with coarse lung markings. Findings are suggestive for chronic changes. No focal airspace disease or consolidation. Heart and mediastinum are within normal limits. Trachea is midline. Atherosclerotic calcifications at the aortic arch. No large pleural effusions. No acute bone abnormality. IMPRESSION: Chronic lung changes without acute findings. Electronically Signed   By: Markus Daft M.D.   On: 09/07/2018 16:03    Ct Head Wo Contrast  Result Date: 09/02/2018 CLINICAL DATA:  Altered mental status and confusion, worsening for the past month. No known injury. EXAM: CT HEAD WITHOUT CONTRAST TECHNIQUE: Contiguous axial images were obtained from the base of the skull through the vertex without intravenous contrast. COMPARISON:  None. FINDINGS: Brain: Mild-to-moderate enlargement of the ventricles and subarachnoid spaces. Mild-to-moderate patchy white matter low density in both cerebral hemispheres. No intracranial hemorrhage, mass lesion or CT evidence of acute infarction. Vascular: No hyperdense vessel or unexpected calcification. Skull: Normal. Negative for fracture or focal lesion. Sinuses/Orbits: No acute finding. Other: None. IMPRESSION: 1. No acute abnormality. 2. Mild to moderate diffuse cerebral and cerebellar atrophy. 3. Mild to moderate chronic small vessel white matter ischemic changes in both cerebral hemispheres. Electronically Signed   By: Claudie Revering M.D.   On: 09/02/2018 16:42    Assessment and Plan:   LUDIA GARTLAND is a 85 y.o. y/o female with chronic constipation  Assessment and Plan: Continue Linzess daily  Continue to use Dulcolax as needed if no bowel movement in 2 to 3 days  Continue high-fiber diet and adequate water intake  Patient was encouraged to call us or notify us with any changes in bowel movements, worsening constipation or any other questions or concerns.  She verbalized understanding  Follow Up Instructions: Follow-up in 1 to 2 months   I discussed the assessment and treatment plan with the patient. The patient was provided an opportunity to ask questions and all were answered. The patient agreed with the plan and demonstrated an understanding of the instructions.   The patient was advised to call back or seek an in-person evaluation if the symptoms worsen or if the condition fails to improve as anticipated.  I provided 15 minutes of non-face-to-face time during this  encounter. Additional time was spent in reviewing patient's chart, placing orders etc.   Virgel Manifold, MD  Speech recognition software was used to dictate this note.

## 2018-09-23 ENCOUNTER — Telehealth: Payer: Self-pay | Admitting: Internal Medicine

## 2018-09-23 NOTE — Telephone Encounter (Signed)
Left message for patient to call back and schedule Medicare Annual Wellness Visit (AWV) either virtually/audio   Last AWV 12.19.18; please schedule at anytime with Denisa O'Brien-Blaney at [facility] for [facility] to schedule

## 2018-09-26 ENCOUNTER — Other Ambulatory Visit: Payer: Self-pay

## 2018-09-26 ENCOUNTER — Ambulatory Visit (INDEPENDENT_AMBULATORY_CARE_PROVIDER_SITE_OTHER): Payer: Medicare Other

## 2018-09-26 DIAGNOSIS — Z0000 Encounter for general adult medical examination without abnormal findings: Secondary | ICD-10-CM | POA: Diagnosis not present

## 2018-09-26 NOTE — Progress Notes (Signed)
Subjective:   Kristine Jacobson is a 85 y.o. female who presents for Medicare Annual (Subsequent) preventive examination.  Review of Systems:  No ROS.  Medicare Wellness Virtual Visit.  Visual/audio telehealth visit, UTA vital signs.   See social history for additional risk factors.   Cardiac Risk Factors include: advanced age (>18mn, >>44women)     Objective:     Vitals: There were no vitals taken for this visit.  There is no height or weight on file to calculate BMI.  Advanced Directives 09/26/2018 12/01/2016 09/15/2015 02/27/2015 02/27/2015  Does Patient Have a Medical Advance Directive? Yes Yes No Yes Yes  Type of [place] - Living will Living will  Does patient want to make changes to medical advance directive? No - Patient declined No - Patient declined - No - Patient declined No - Patient declined  Copy of [place]? No - copy requested No - copy requested - No - copy requested No - copy requested  Would patient like information on creating a medical advance directive? - - No - patient declined information - -    Tobacco Social History   Tobacco Use  Smoking Status Never Smoker  Smokeless Tobacco Never Used     Counseling given: Not Answered   Clinical Intake:  Pre-visit preparation completed: Yes        Diabetes: No  How often do you need to have someone help you when you read instructions, pamphlets, or other written materials from your doctor or pharmacy?: 1 - Never  Interpreter Needed?: No     Past Medical History:  Diagnosis Date  . Allergy    seasonal   . Arthritis    shoulders, hands  . [place]  . Chronic neck pain    2/2 bulging discs   . HBP (high blood pressure)   . Hearing aid worn    bilateral  . Macular degeneration    dx'ed 2015 dry now left wet and right mild wet [facility]. PMichelene Heady .  Macular degeneration, age related   . Osteoporosis   . Overactive bladder   . Restless leg   . SBO (small bowel obstruction) ([place]    01/2017, 02/2018  . Spinal stenosis   . Urinary incontinence    s/p monthly PTNS with Dr. CJacqlyn Larsen no help with overactive bladder with meds nor Botox   . UTI (urinary tract infection)   . Vertigo    no episodes for several years  . Wears dentures    partial upper and lower   Past Surgical History:  Procedure Laterality Date  . ABDOMINAL HYSTERECTOMY     1967  . APPENDECTOMY     1967  . BLADDER SURGERY     bladder suspension; 2005, 2011-Dr. Cope  . BREAST EXCISIONAL BIOPSY Right 1970   neg  . BROW LIFT Bilateral 12/01/2016   Procedure: BLEPHAROPLASTY upper eyelid with excess skin;  Surgeon: FKarle Starch MD;  [facility]  Service: Ophthalmology;  Laterality: Bilateral;  MAC  . CARPAL TUNNEL RELEASE     right 2016   . Esophageal dilitation    . eyelid surgery     2018  . FRACTURE SURGERY    . HIP SURGERY     left 2016   . INTRAMEDULLARY (IM) NAIL INTERTROCHANTERIC Left 02/27/2015   Procedure: INTRAMEDULLARY (IM) NAIL INTERTROCHANTRIC;  Surgeon: Dereck Leep, MD;  Location: [facility];  Service: Orthopedics;  Laterality: Left;  . PTOSIS REPAIR Bilateral 12/01/2016   Procedure: PTOSIS REPAIR  repair resect ex;  Surgeon: Karle Starch, MD;  Location: [facility];  Service: Ophthalmology;  Laterality: Bilateral;  . TONSILLECTOMY     Family History  Problem Relation Age of Onset  . Diabetes Mother   . Arthritis Mother   . Stroke Mother   . Heart disease Father   . Diabetes Sister   . Cancer Brother        lung 2/2 asbestos   . Diabetes Daughter   . Cancer Daughter        DCIS breast   . Diabetes Brother   . Diabetes Brother   . Breast cancer Neg Hx    Social History   Socioeconomic History  . Marital status: Widowed    Spouse name: Not on file  . Number of children: Not on file  . Years of education: Not  on file  . Highest education level: Not on file  Occupational History  . Not on file  Social Needs  . Financial resource strain: Not hard at all  . Food insecurity    Worry: Never true    Inability: Never true  . Transportation needs    Medical: No    Non-medical: No  Tobacco Use  . Smoking status: Never Smoker  . Smokeless tobacco: Never Used  Substance and Sexual Activity  . Alcohol use: No  . Drug use: No  . Sexual activity: Not on file  Lifestyle  . Physical activity    Days per week: 7 days    Minutes per session: 50 min  . Stress: Not at all  Relationships  . Social Herbalist on phone: Not on file    Gets together: Not on file    Attends religious service: Not on file    Active member of club or organization: Not on file    Attends meetings of clubs or organizations: Not on file    Relationship status: Not on file  Other Topics Concern  . Not on file  Social History Narrative   Lives alone.   Daughter [name] involved in care. 1 other child    HS ed.    Retired Education officer, museum    No guns    Wears seat belts    Safe in relationship    Enjoys reading    Outpatient Encounter Medications as of 09/26/2018  Medication Sig  . acetaminophen (TYLENOL) 500 MG tablet Take 1,000 mg by mouth 2 (two) times daily as needed (pain).   Marland Kitchen amLODipine (NORVASC) 5 MG tablet Take 1 tablet (5 mg total) by mouth daily.  Marland Kitchen aspirin EC 81 MG tablet Take 81 mg by mouth at bedtime.  . benzonatate (TESSALON) 100 MG capsule Take 1 capsule (100 mg total) by mouth 3 (three) times daily as needed for cough.  . bisacodyl (BISACODYL) 5 MG EC tablet Take 5 mg by mouth 2 (two) times a week. Take 1 tablet (5 mg) by mouth on Sunday and Wednesday nights (the night before Linzess dose)  . Calcium Carb-Cholecalciferol (CALCIUM 600 + D PO) Take 600 mg by mouth 2 (two) times daily.  . Calcium Carbonate-Vitamin D (CALTRATE 600+D) 600-400 MG-UNIT tablet Take by mouth.  . carvedilol (COREG) 6.25  MG tablet Take 1 tablet (6.25 mg total) by mouth 2 (two) times daily with a meal.  .  furosemide (LASIX) 20 MG tablet Take 1 tablet (20 mg total) by mouth every other day as needed. Leg edema prn  . gabapentin (NEURONTIN) 100 MG capsule Take 2 capsules (200 mg total) by mouth 3 (three) times daily.  . hydrALAZINE (APRESOLINE) 25 MG tablet Take 1 tablet (25 mg total) by mouth 2 (two) times daily.  Marland Kitchen linaclotide (LINZESS) 290 MCG CAPS capsule Take 1 capsule (290 mcg total) by mouth daily before breakfast.  . LINZESS 290 MCG CAPS capsule TAKE 1 CAPSULE BY MOUTH DAILY BEFORE BREAKFAST  . loratadine (CLARITIN) 10 MG tablet Take 10 mg by mouth as needed.  Marland Kitchen losartan-hydrochlorothiazide (HYZAAR) 100-25 MG tablet Take 1 tablet by mouth daily.  . Multiple Vitamin (MULTIVITAMIN WITH MINERALS) TABS tablet Take 1 tablet by mouth daily. Centrum Silver  . Multiple Vitamins-Minerals (PRESERVISION AREDS 2) CAPS Take 1 capsule by mouth 2 (two) times daily.  . nitrofurantoin, macrocrystal-monohydrate, (MACROBID) 100 MG capsule Take 1 capsule (100 mg total) by mouth 2 (two) times daily.  Marland Kitchen nystatin (MYCOSTATIN) 100000 UNIT/ML suspension Take 5 mLs (500,000 Units total) by mouth 4 (four) times daily. Oral thrush. Hold in mouth as long as you can and spit or swallow  . Propylene Glycol (SYSTANE BALANCE OP) Place 1 drop into both eyes 3 (three) times daily as needed (dry eyes).  Marland Kitchen rOPINIRole (REQUIP) 0.5 MG tablet TAKE 1 TABLET BY MOUTH IN THE MORNING, 1 TABLET BY MOUTH AT LUNCH, AND 1 TABLET BY MOUTH IN THE EVENING.  . traMADol (ULTRAM) 50 MG tablet Take 1 tablet (50 mg total) by mouth every 6 (six) hours as needed for moderate pain. (Patient taking differently: Take 50 mg by mouth daily as needed for moderate pain. )   No facility-administered encounter medications on file as of 09/26/2018.     Activities of Daily Living In your present state of health, do you have any difficulty performing the following activities:  09/26/2018  Hearing? Y  Comment Hearing aid  Vision? Y  Difficulty concentrating or making decisions? N  Walking or climbing stairs? N  Dressing or bathing? N  Doing errands, shopping? Y  Comment Patient does not Physiological scientist and eating ? N  Using the Toilet? N  In the past six months, have you accidently leaked urine? Y  Comment Managed with daily liner  Do you have problems with loss of bowel control? N  Managing your Medications? N  Managing your Finances? N  Housekeeping or managing your Housekeeping? N  Some recent data might be hidden    Patient Care Team: McLean-Scocuzza, Nino Glow, MD as PCP - General (Internal Medicine)    Assessment:   This is a routine wellness examination for Kristine Jacobson.  I connected with patient 09/26/18 at 11:30 AM EDT by an audio enabled telemedicine application and verified that I am speaking with the correct person using two identifiers. Patient stated full name and DOB. Patient gave permission to continue with virtual visit. Patient's location was at home and Nurse's location was at [place] office.   Health Screenings  Mammogram - 07/2017; aged out Glaucoma -none Macular degeneration- yes Hearing -hearing aid Cholesterol - 06/2018 Vitamin B12- 06/2018 Dental- UTD Vision- visits within the last 12 months.  Social  Alcohol intake - no     Smoking history- never    Smokers in home? none Illicit drug use? none Exercise - walking  Diet - healthy Sexually Active -not currently BMI- discussed the importance of a healthy diet, water intake  and the benefits of aerobic exercise.  Educational material provided.   Safety  Patient feels safe at home- yes Patient does have smoke detectors at home- yes Patient does wear sunscreen or protective clothing when in direct sunlight -yes Patient does wear seat belt when in a moving vehicle -yes  Covid-19 precautions and sickness symptoms discussed.   Activities of Daily Living Patient denies needing  assistance with: household chores, feeding themselves, getting from bed to chair, getting to the toilet, bathing/showering, dressing, managing money, or preparing meals.  Daughter drives her as needed for travel . No new identified risk were noted.    Depression Screen Patient denies losing interest in daily life, feeling hopeless, or crying easily over simple problems.   Medication-taking as directed and without issues.   Fall Screen Patient denies being afraid of falling or falling in the last year.   Memory Screen Patient is alert.  Patient denies difficulty focusing, concentrating or misplacing items. Correctly identified the president of the [place], season and recall. Patient likes to read and complete word search puzzles for brain stimulation.  Immunizations The following Immunizations were discussed: Influenza, shingles, pneumonia, and tetanus.   Other Providers Patient Care Team: McLean-Scocuzza, Nino Glow, MD as PCP - General (Internal Medicine)  Exercise Activities and Dietary recommendations Current Exercise Habits: Home exercise routine, Type of exercise: walking, Time (Minutes): 40, Frequency (Times/Week): 7, Weekly Exercise (Minutes/Week): 280, Intensity: Mild  Goals      Patient Stated   . Follow up with Primary Care Provider (pt-stated)     As needed       Fall Risk Fall Risk  09/26/2018 08/04/2017 03/03/2017  Falls in the past year? 0 No No   Depression Screen PHQ 2/9 Scores 09/26/2018 08/04/2017 03/03/2017  PHQ - 2 Score 0 0 0     Cognitive Function     6CIT Screen 09/26/2018  What Year? 0 points  What month? 0 points  What time? 0 points  Count back from 20 0 points  Months in reverse 0 points    Immunization History  Administered Date(s) Administered  . Influenza, High Dose Seasonal PF 12/24/2017  . Influenza-Unspecified 10/28/2016  . Pneumococcal Polysaccharide-23 03/03/2017  . Zoster 03/03/2017   Screening Tests Health Maintenance  Topic  Date Due  . MAMMOGRAM  09/26/2019 (Originally 08/13/2018)  . PNA vac Low Risk Adult (2 of 2 - PCV13) 09/26/2019 (Originally 03/03/2018)  . INFLUENZA VACCINE  10/15/2018  . TETANUS/TDAP  03/04/2027  . DEXA SCAN  Completed      Plan:    End of life planning; Advance aging; Advanced directives discussed.  Copy of current HCPOA/Living Will on file.    I have personally reviewed and noted the following in the patient's chart:   . Medical and social history . Use of alcohol, tobacco or illicit drugs  . Current medications and supplements . Functional ability and status . Nutritional status . Physical activity . Advanced directives . List of other physicians . Hospitalizations, surgeries, and ER visits in previous 12 months . Vitals . Screenings to include cognitive, depression, and falls . Referrals and appointments  In addition, I have reviewed and discussed with patient certain preventive protocols, quality metrics, and best practice recommendations. A written personalized care plan for preventive services as well as general preventive health recommendations were provided to patient.     Varney Biles, LPN  6/[id number]/[id number]

## 2018-09-26 NOTE — Patient Instructions (Addendum)
  Kristine Jacobson , Thank you for taking time to come for your Medicare Wellness Visit. I appreciate your ongoing commitment to your health goals. Please review the following plan we discussed and let me know if I can assist you in the future.   These are the goals we discussed: Goals      Patient Stated   . Follow up with Primary Care Provider (pt-stated)     As needed       This is a list of the screening recommended for you and due dates:  Health Maintenance  Topic Date Due  . Mammogram  09/26/2019*  . Pneumonia vaccines (2 of 2 - PCV13) 09/26/2019*  . Flu Shot  10/15/2018  . Tetanus Vaccine  03/04/2027  . DEXA scan (bone density measurement)  Completed  *Topic was postponed. The date shown is not the original due date.

## 2018-09-29 DIAGNOSIS — B351 Tinea unguium: Secondary | ICD-10-CM | POA: Diagnosis not present

## 2018-09-29 DIAGNOSIS — M79674 Pain in right toe(s): Secondary | ICD-10-CM | POA: Diagnosis not present

## 2018-09-29 DIAGNOSIS — M79675 Pain in left toe(s): Secondary | ICD-10-CM | POA: Diagnosis not present

## 2018-09-29 DIAGNOSIS — L97511 Non-pressure chronic ulcer of other part of right foot limited to breakdown of skin: Secondary | ICD-10-CM | POA: Diagnosis not present

## 2018-10-19 DIAGNOSIS — H353221 Exudative age-related macular degeneration, left eye, with active choroidal neovascularization: Secondary | ICD-10-CM | POA: Diagnosis not present

## 2018-11-03 ENCOUNTER — Other Ambulatory Visit: Payer: Self-pay | Admitting: Internal Medicine

## 2018-11-03 ENCOUNTER — Other Ambulatory Visit: Payer: Self-pay | Admitting: Gastroenterology

## 2018-11-03 DIAGNOSIS — I10 Essential (primary) hypertension: Secondary | ICD-10-CM

## 2018-11-03 MED ORDER — LOSARTAN POTASSIUM-HCTZ 100-25 MG PO TABS: 1.0000 | ORAL_TABLET | Freq: Every day | ORAL | 3 refills | Status: DC

## 2018-11-07 ENCOUNTER — Telehealth: Payer: Self-pay | Admitting: Gastroenterology

## 2018-11-07 NOTE — Telephone Encounter (Signed)
Last office visit 09/19/2018 constipation.  Last refill 11/03/18 0 refills sent to [facility]

## 2018-11-07 NOTE — Telephone Encounter (Signed)
Kristine Jacobson from [facility] left vm  Regarding maintance  rx Linzess for Approval of refill please call cb [phone]

## 2018-11-22 DIAGNOSIS — K219 Gastro-esophageal reflux disease without esophagitis: Secondary | ICD-10-CM | POA: Insufficient documentation

## 2018-11-22 DIAGNOSIS — M519 Unspecified thoracic, thoracolumbar and lumbosacral intervertebral disc disorder: Secondary | ICD-10-CM | POA: Insufficient documentation

## 2018-11-22 DIAGNOSIS — C4490 Unspecified malignant neoplasm of skin, unspecified: Secondary | ICD-10-CM | POA: Insufficient documentation

## 2018-11-23 ENCOUNTER — Ambulatory Visit (INDEPENDENT_AMBULATORY_CARE_PROVIDER_SITE_OTHER): Payer: Medicare Other | Admitting: Gastroenterology

## 2018-11-23 ENCOUNTER — Encounter: Payer: Self-pay | Admitting: Gastroenterology

## 2018-11-23 DIAGNOSIS — K5900 Constipation, unspecified: Secondary | ICD-10-CM

## 2018-11-23 NOTE — Progress Notes (Signed)
[name], MD [address]  [place]  [place], [place] [address]  Main: [phone]  Fax: [phone]   Primary Care Physician: McLean-Scocuzza, Nino Glow, MD  Virtual Visit via Telephone Note  I connected with patient on 11/23/18 at  2:15 PM EDT by telephone and verified that I am speaking with the correct person using two identifiers.   I discussed the limitations, risks, security and privacy concerns of performing an evaluation and management service by telephone and the availability of in person appointments. I also discussed with the patient that there may be a patient responsible charge related to this service. The patient expressed understanding and agreed to proceed.  Location of Patient: Home Location of Provider: Home Persons involved: Patient and provider only during the visit (nursing staff and front desk staff was involved in communicating with the patient prior to the appointment, reviewing medications and checking them in)   History of Present Illness: Chief Complaint  Patient presents with  . Constipation    Patient daughter states this better since last office visit.      HPI: Kristine Jacobson is a 85 y.o. female being seen for follow-up of constipation.  Patient was started on Linzess and has been doing well.  Reports 1 bowel movement every day which is soft and she does not have to strain.  No abdominal pain.  No weight loss.  No nausea or vomiting.  Good appetite.  No dysphagia.  Previous history: MREthis year did not show any evidence of IBD or any other abnormalities of the bowel. This was done due to her previous history of small bowel obstructions in the past to rule out any underlying abnormalities.  Patient was admitted to [facility] in December 2019 with small bowel obstruction managed conservatively and followed by surgery during that inpatient admission. CT on that admission showed dilated loops of small bowel to 5.1 cm, fecalizationof the  small bowel in the right lower quadrant with a transition point in the right lower quadrant. Mesenteric thickening adjacent to this dilated small bowel loops was also reported.  Prior to this she was admitted in November 2018 with small bowel obstruction as well. Which reported transition point in the right lower quadrant representing ileus or partial small bowel obstruction. Enteritis or paralytic ileus was the differential on that CT report. Patient was managed conservatively during this episode as well.  Patient states leading up to her hospitalizations both time she was constipated for 3 to 4 days and did not have a bowel movement during that time and then ended up having nausea and vomiting.  She has had previous history of abdominal surgeries but her last surgery was 50 years ago. She does recall a year after her hysterectomy she had severe abdominal pain which led to exploratory laparotomy with lysis of adhesions at that time. Since then she has been asymptomatic.  Last colonoscopy, 2005 for screening with Dr. Nicolasa Ducking. This reported excellent prep, extent of exam cecum, normal colon. Internal hemorrhoids reported.  EGD 2012 by Dr. Vira Agar for dysphagia. Reported normal esophagus stomach and duodenum. Reports dilation with savory dilator to 16 mm at the time.  Current Outpatient Medications  Medication Sig Dispense Refill  . acetaminophen (TYLENOL) 500 MG tablet Take 1,000 mg by mouth 2 (two) times daily as needed (pain).     Marland Kitchen amLODipine (NORVASC) 5 MG tablet Take 1 tablet (5 mg total) by mouth daily. 90 tablet 3  . aspirin EC 81 MG tablet Take  81 mg by mouth at bedtime.    . benzonatate (TESSALON) 100 MG capsule Take 1 capsule (100 mg total) by mouth 3 (three) times daily as needed for cough. 30 capsule 0  . bisacodyl (BISACODYL) 5 MG EC tablet Take 5 mg by mouth 2 (two) times a week. Take 1 tablet (5 mg) by mouth on Sunday and Wednesday nights (the night before Linzess dose)     . Calcium Carb-Cholecalciferol (CALCIUM 600 + D PO) Take 600 mg by mouth 2 (two) times daily.    . Calcium Carbonate-Vitamin D (CALTRATE 600+D) 600-400 MG-UNIT tablet Take by mouth.    . carvedilol (COREG) 6.25 MG tablet Take 1 tablet (6.25 mg total) by mouth 2 (two) times daily with a meal. 180 tablet 3  . furosemide (LASIX) 20 MG tablet Take 1 tablet (20 mg total) by mouth every other day as needed. Leg edema prn 30 tablet 2  . gabapentin (NEURONTIN) 100 MG capsule Take 2 capsules (200 mg total) by mouth 3 (three) times daily. 540 capsule 3  . hydrALAZINE (APRESOLINE) 25 MG tablet Take 1 tablet (25 mg total) by mouth 2 (two) times daily. 180 tablet 3  . linaclotide (LINZESS) 290 MCG CAPS capsule Take 1 capsule (290 mcg total) by mouth daily before breakfast. 7 capsule 0  . LINZESS 290 MCG CAPS capsule TAKE 1 CAPSULE BY MOUTH DAILY BEFORE BREAKFAST. 30 capsule 0  . loratadine (CLARITIN) 10 MG tablet Take 10 mg by mouth as needed.    Marland Kitchen losartan-hydrochlorothiazide (HYZAAR) 100-25 MG tablet Take 1 tablet by mouth daily. 90 tablet 3  . Multiple Vitamin (MULTIVITAMIN WITH MINERALS) TABS tablet Take 1 tablet by mouth daily. Centrum Silver    . Multiple Vitamins-Minerals (PRESERVISION AREDS 2) CAPS Take 1 capsule by mouth 2 (two) times daily.    . nitrofurantoin, macrocrystal-monohydrate, (MACROBID) 100 MG capsule Take 1 capsule (100 mg total) by mouth 2 (two) times daily. 10 capsule 0  . nystatin (MYCOSTATIN) 100000 UNIT/ML suspension Take 5 mLs (500,000 Units total) by mouth 4 (four) times daily. Oral thrush. Hold in mouth as long as you can and spit or swallow 473 mL 0  . Propylene Glycol (SYSTANE BALANCE OP) Place 1 drop into both eyes 3 (three) times daily as needed (dry eyes).    Marland Kitchen rOPINIRole (REQUIP) 0.5 MG tablet TAKE 1 TABLET BY MOUTH IN THE MORNING, 1 TABLET BY MOUTH AT LUNCH, AND 1 TABLET BY MOUTH IN THE EVENING. 270 tablet 3  . traMADol (ULTRAM) 50 MG tablet Take 1 tablet (50 mg total) by  mouth every 6 (six) hours as needed for moderate pain. (Patient taking differently: Take 50 mg by mouth daily as needed for moderate pain. ) 16 tablet 0   No current facility-administered medications for this visit.     Allergies as of 11/23/2018 - Review Complete 11/23/2018  Allergen Reaction Noted  . Codeine Rash 06/08/2013    Review of Systems:    All systems reviewed and negative except where noted in HPI.   Observations/Objective:  Labs: CMP     Component Value Date/Time   NA 141 07/14/2018 0915   K 3.9 07/14/2018 0915   CL 103 07/14/2018 0915   CO2 27 07/14/2018 0915   GLUCOSE 98 07/14/2018 0915   BUN 18 07/14/2018 0915   CREATININE 0.80 07/14/2018 0915   CALCIUM 9.7 07/14/2018 0915   PROT 7.4 07/14/2018 0915   ALBUMIN 4.2 07/14/2018 0915   AST 27 07/14/2018 0915   ALT  18 07/14/2018 0915   ALKPHOS 65 07/14/2018 0915   BILITOT 0.4 07/14/2018 0915   GFRNONAA >60 03/14/2015 0920   GFRAA >60 03/14/2015 0920   Lab Results  Component Value Date   WBC 5.7 09/05/2018   HGB 11.3 09/05/2018   HCT 34.3 09/05/2018   MCV 87 09/05/2018   PLT 320 09/05/2018    Imaging Studies: No results found.  Assessment and Plan:   Kristine Jacobson is a 85 y.o. y/o female here for follow-up of constipation  Assessment and Plan: Symptoms resolved with Linzess daily  Only using Dulcolax about once every 2 weeks as needed  Continue high-fiber diet and adequate water intake  Patient encouraged to call us to notify us with any changes in bowel movements    Follow Up Instructions: Follow-up in 6 months   I discussed the assessment and treatment plan with the patient. The patient was provided an opportunity to ask questions and all were answered. The patient agreed with the plan and demonstrated an understanding of the instructions.   The patient was advised to call back or seek an in-person evaluation if the symptoms worsen or if the condition fails to improve as anticipated.  I  provided 15 minutes of non-face-to-face time during this encounter. Additional time was spent in reviewing patient's chart, placing orders etc.   Virgel Manifold, MD  Speech recognition software was used to dictate this note.

## 2018-12-12 ENCOUNTER — Telehealth: Payer: Self-pay

## 2018-12-12 MED ORDER — LINACLOTIDE 290 MCG PO CAPS: 290.0000 ug | ORAL_CAPSULE | Freq: Every day | ORAL | 0 refills | Status: DC

## 2018-12-12 NOTE — Telephone Encounter (Signed)
Patient daughter is calling because she states her mom has been out of the [place] for 3 days now and is now constipated. Patient needs a 2 week supply sent to [facility] and then a 90 day to the mail order. Sent medication to mail order and local pharmacy

## 2018-12-20 ENCOUNTER — Ambulatory Visit: Payer: Medicare Other | Admitting: Internal Medicine

## 2019-01-18 DIAGNOSIS — H353221 Exudative age-related macular degeneration, left eye, with active choroidal neovascularization: Secondary | ICD-10-CM | POA: Diagnosis not present

## 2019-01-18 DIAGNOSIS — H43813 Vitreous degeneration, bilateral: Secondary | ICD-10-CM | POA: Diagnosis not present

## 2019-01-23 ENCOUNTER — Encounter: Payer: Self-pay | Admitting: Internal Medicine

## 2019-01-24 ENCOUNTER — Other Ambulatory Visit: Payer: Self-pay | Admitting: Internal Medicine

## 2019-01-24 DIAGNOSIS — G2581 Restless legs syndrome: Secondary | ICD-10-CM

## 2019-01-24 MED ORDER — GABAPENTIN 100 MG PO CAPS: 200.0000 mg | ORAL_CAPSULE | Freq: Three times a day (TID) | ORAL | 3 refills | Status: DC

## 2019-01-25 ENCOUNTER — Other Ambulatory Visit: Payer: Self-pay | Admitting: Internal Medicine

## 2019-01-25 DIAGNOSIS — G2581 Restless legs syndrome: Secondary | ICD-10-CM

## 2019-01-25 DIAGNOSIS — I10 Essential (primary) hypertension: Secondary | ICD-10-CM

## 2019-01-25 MED ORDER — CARVEDILOL 6.25 MG PO TABS: 6.2500 mg | ORAL_TABLET | Freq: Two times a day (BID) | ORAL | 3 refills | Status: DC

## 2019-01-25 MED ORDER — ROPINIROLE HCL 0.5 MG PO TABS: ORAL_TABLET | ORAL | 3 refills | Status: DC

## 2019-02-14 ENCOUNTER — Encounter: Payer: Self-pay | Admitting: Internal Medicine

## 2019-02-21 ENCOUNTER — Telehealth (INDEPENDENT_AMBULATORY_CARE_PROVIDER_SITE_OTHER): Payer: Medicare Other | Admitting: Internal Medicine

## 2019-02-21 ENCOUNTER — Encounter: Payer: Self-pay | Admitting: Internal Medicine

## 2019-02-21 VITALS — Ht 64.5 in | Wt 149.0 lb

## 2019-02-21 DIAGNOSIS — I872 Venous insufficiency (chronic) (peripheral): Secondary | ICD-10-CM

## 2019-02-21 DIAGNOSIS — N390 Urinary tract infection, site not specified: Secondary | ICD-10-CM

## 2019-02-21 DIAGNOSIS — N3020 Other chronic cystitis without hematuria: Secondary | ICD-10-CM

## 2019-02-21 DIAGNOSIS — M545 Low back pain, unspecified: Secondary | ICD-10-CM

## 2019-02-21 DIAGNOSIS — R413 Other amnesia: Secondary | ICD-10-CM

## 2019-02-21 DIAGNOSIS — M810 Age-related osteoporosis without current pathological fracture: Secondary | ICD-10-CM

## 2019-02-21 DIAGNOSIS — K219 Gastro-esophageal reflux disease without esophagitis: Secondary | ICD-10-CM

## 2019-02-21 DIAGNOSIS — Z1231 Encounter for screening mammogram for malignant neoplasm of breast: Secondary | ICD-10-CM | POA: Diagnosis not present

## 2019-02-21 DIAGNOSIS — M1990 Unspecified osteoarthritis, unspecified site: Secondary | ICD-10-CM

## 2019-02-21 DIAGNOSIS — H3530 Unspecified macular degeneration: Secondary | ICD-10-CM

## 2019-02-21 DIAGNOSIS — M5030 Other cervical disc degeneration, unspecified cervical region: Secondary | ICD-10-CM

## 2019-02-21 DIAGNOSIS — I10 Essential (primary) hypertension: Secondary | ICD-10-CM

## 2019-02-21 DIAGNOSIS — C4490 Unspecified malignant neoplasm of skin, unspecified: Secondary | ICD-10-CM

## 2019-02-21 DIAGNOSIS — K56609 Unspecified intestinal obstruction, unspecified as to partial versus complete obstruction: Secondary | ICD-10-CM

## 2019-02-21 DIAGNOSIS — G629 Polyneuropathy, unspecified: Secondary | ICD-10-CM

## 2019-02-21 DIAGNOSIS — M519 Unspecified thoracic, thoracolumbar and lumbosacral intervertebral disc disorder: Secondary | ICD-10-CM

## 2019-02-21 DIAGNOSIS — R269 Unspecified abnormalities of gait and mobility: Secondary | ICD-10-CM

## 2019-02-21 DIAGNOSIS — Z85828 Personal history of other malignant neoplasm of skin: Secondary | ICD-10-CM

## 2019-02-21 DIAGNOSIS — M4800 Spinal stenosis, site unspecified: Secondary | ICD-10-CM

## 2019-02-21 DIAGNOSIS — G2581 Restless legs syndrome: Secondary | ICD-10-CM

## 2019-02-21 DIAGNOSIS — R600 Localized edema: Secondary | ICD-10-CM

## 2019-02-21 DIAGNOSIS — I7025 Atherosclerosis of native arteries of other extremities with ulceration: Secondary | ICD-10-CM

## 2019-02-21 MED ORDER — AMLODIPINE BESYLATE 5 MG PO TABS: 5.0000 mg | ORAL_TABLET | Freq: Every day | ORAL | 3 refills | Status: DC

## 2019-02-21 MED ORDER — DOXYCYCLINE HYCLATE 100 MG PO TABS: 100.0000 mg | ORAL_TABLET | Freq: Every day | ORAL | 0 refills | Status: DC

## 2019-02-21 NOTE — Progress Notes (Signed)
Telephone Note  I connected with Kristine Jacobson  on 02/22/19 at  4:30 PM EST by telephone and verified that I am speaking with the correct person using two identifiers.  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 The patient expressed understanding and agreed to proceed.   HPI: 1. Memory loss daughter is concerned though pt is not as concerned daughter reports pt argumentative at times and repeats herself several times CT head 09/02/18 with mild to moderate atrophy. Daughter is not sure if pt will agree to see neurology but this is rec. And will get back with me. Pt is forgetting to take meds she tells me but will remember later and daughter reports several pills she finds pt does not take Pt is eating though reports not drinking as much water as she shoulde and wt about the same she reports though she has not weighed hrslef   I discussed with daughter pill pak which she wants to change all meds from [facility] to pill pack except hold on filling linzess per daughter   2. HTN pt on hydralazine 25 bid, coreg 6.25 mg bid, losartanhctz 100=25  And norvasc 5 mg qd pt has not checked BP will check and daughter will my chart result   3. Chronic UTI chronic macrobid was causing cough pt denies cough now and on doxycycline 100 mg qhs per urology I reviewed with daughter UTI was R to doxycycline and for chronic UTI pt must keep f/u with urology   ROS: See pertinent positives and negatives per HPI.  Past Medical History:  Diagnosis Date  . Allergy    seasonal   . Arthritis    shoulders, hands  . Cancer ([place])    [place] [facility]-Dr. Evorn Gong   . Chronic neck pain    2/2 bulging discs   . HBP (high  blood pressure)   . Hearing aid worn    bilateral  . Macular degeneration    dx'ed 2015 dry now left wet and right mild wet [facility] Dr. Michelene Heady  . Macular degeneration, age related   . Osteoporosis   . Overactive bladder   . Restless leg   . SBO (small bowel obstruction) ([place])    01/2017, 02/2018  . Spinal stenosis   . Urinary incontinence    s/p monthly PTNS with Dr. Jacqlyn Larsen, no help with overactive bladder with meds nor Botox   . UTI (urinary tract infection)   . Vertigo    no episodes for several years  . Wears dentures    partial upper and lower    Past Surgical History:  Procedure Laterality Date  . ABDOMINAL HYSTERECTOMY     1967  . APPENDECTOMY     1967  . BLADDER SURGERY     bladder suspension; 2005, 2011-Dr. Cope  . BREAST EXCISIONAL BIOPSY Right 1970   neg  . BROW LIFT Bilateral 12/01/2016   Procedure: BLEPHAROPLASTY upper eyelid with excess skin;  Surgeon: Karle Starch, MD;  Location: [facility];  Service: Ophthalmology;  Laterality: Bilateral;  MAC  . CARPAL TUNNEL RELEASE     right 2016   . Esophageal dilitation    . eyelid surgery     2018  . FRACTURE  SURGERY    . HIP SURGERY     left 05/09/2014   . INTRAMEDULLARY (IM) NAIL INTERTROCHANTERIC Left 02/27/2015   Procedure: INTRAMEDULLARY (IM) NAIL INTERTROCHANTRIC;  Surgeon: Dereck Leep, MD;  Location: [facility];  Service: Orthopedics;  Laterality: Left;  . PTOSIS REPAIR Bilateral 12/01/2016   Procedure: PTOSIS REPAIR  repair resect ex;  Surgeon: Karle Starch, MD;  Location: [facility];  Service: Ophthalmology;  Laterality: Bilateral;  . TONSILLECTOMY      Family History  Problem Relation Age of Onset  . Diabetes Mother   . Arthritis Mother   . Stroke Mother   . Heart disease Father   . Diabetes Sister   . Cancer Brother        lung 2/2 asbestos   . Diabetes Daughter   . Cancer Daughter        DCIS breast   . Diabetes Brother   . Diabetes Brother   . Heart attack Grandson         died age 37 in  2017/05/09  . Breast cancer Neg Hx     SOCIAL HX: lives alone daughter Kristine Jacobson involved in care   Current Outpatient Medications:  .  acetaminophen (TYLENOL) 500 MG tablet, Take 1,000 mg by mouth 2 (two) times daily as needed (pain). , Disp: , Rfl:  .  amLODipine (NORVASC) 5 MG tablet, Take 1 tablet (5 mg total) by mouth daily., Disp: 90 tablet, Rfl: 3 .  Calcium Carb-Cholecalciferol (CALCIUM 600 + D PO), Take 600 mg by mouth 2 (two) times daily., Disp: , Rfl:  .  Calcium Carbonate-Vitamin D (CALTRATE 600+D) 600-400 MG-UNIT tablet, Take by mouth., Disp: , Rfl:  .  carvedilol (COREG) 6.25 MG tablet, Take 1 tablet (6.25 mg total) by mouth 2 (two) times daily with a meal., Disp: 180 tablet, Rfl: 3 .  gabapentin (NEURONTIN) 100 MG capsule, Take 2 capsules (200 mg total) by mouth 3 (three) times daily., Disp: 540 capsule, Rfl: 3 .  hydrALAZINE (APRESOLINE) 25 MG tablet, Take 1 tablet (25 mg total) by mouth 2 (two) times daily., Disp: 180 tablet, Rfl: 3 .  linaclotide (LINZESS) 290 MCG CAPS capsule, Take 1 capsule (290 mcg total) by mouth daily before breakfast., Disp: 90 capsule, Rfl: 0 .  loratadine (CLARITIN) 10 MG tablet, Take 10 mg by mouth as needed., Disp: , Rfl:  .  losartan-hydrochlorothiazide (HYZAAR) 100-25 MG tablet, Take 1 tablet by mouth daily., Disp: 90 tablet, Rfl: 3 .  Multiple Vitamin (MULTIVITAMIN WITH MINERALS) TABS tablet, Take 1 tablet by mouth daily. Centrum Silver, Disp: , Rfl:  .  Multiple Vitamins-Minerals (PRESERVISION AREDS 2) CAPS, Take 1 capsule by mouth 2 (two) times daily., Disp: , Rfl:  .  Propylene Glycol (SYSTANE BALANCE OP), Place 1 drop into both eyes 3 (three) times daily as needed (dry eyes)., Disp: , Rfl:  .  rOPINIRole (REQUIP) 0.5 MG tablet, TAKE 1 TABLET BY MOUTH IN THE MORNING, 1 TABLET BY MOUTH AT LUNCH, AND 1 TABLET BY MOUTH IN THE EVENING., Disp: 270 tablet, Rfl: 3 .  traMADol (ULTRAM) 50 MG tablet, Take 1 tablet (50 mg total) by mouth every 6  (six) hours as needed for moderate pain. (Patient taking differently: Take 50 mg by mouth daily as needed for moderate pain. ), Disp: 16 tablet, Rfl: 0 .  doxycycline (VIBRA-TABS) 100 MG tablet, Take 1 tablet (100 mg total) by mouth daily., Disp: 20 tablet, Rfl: 0  EXAM:  VITALS per patient if  applicable:  GENERAL: alert, oriented, appears well and in no acute distress  PSYCH/NEURO: pleasant and cooperative, no obvious depression or anxiety, speech and thought processing grossly intact  ASSESSMENT AND PLAN:  Discussed the following assessment and plan:  Memory loss rec neurology daughter to let me know if pt agreeable Dr. Manuella Ghazi  CT and B12 done CT abnormal with mild to moderate atrphy Change all Rx to pill pak help pt with compliance  Essential hypertension - Plan: amLODipine (NORVASC) 5 MG tablet, carvedilol (COREG) 6.25 MG tablet, hydrALAZINE (APRESOLINE) 25 MG tablet, losartan-hydrochlorothiazide (HYZAAR) 100-25 MG tablet, Lipid panel, Comprehensive metabolic panel, CBC with Differential/Platelet BP check in future   Osteoporosis, unspecified osteoporosis type, unspecified pathological fracture presence - Plan: DG Bone Density  RLS (restless legs syndrome) - Plan: gabapentin (NEURONTIN) 100 MG capsule, rOPINIRole (REQUIP) 0.5 MG tablet  Chronic UTI - Plan: Urinalysis, Routine w reflex microscopic, Urine Culture F/u urology advised daughter to schedule appt  HM utd flu Tdap 2016 need to verifyprior records pna 23 03/03/17,  prevnar in future Had zostervax  disc shingrixtoday and given info  Colonoscopy ? 5 years agoout of age window nowf/u GI Dr. Darene Lamer S/p hysterectomy out of age window pap  Mammogramrepeat 08/26/17 normal DEXA 12/31/14 femoral neck +osteoporosis, total femoral density osteopenia, spine normal -ordered repeat DEXAnot done yet  Dermatologysaw 12/22/17 LN2 and bx left hand will f/u upcoming h/o BCC and left hand lesion c/w NMSC per pt and  daughter Had appt sch 02/22/19 daughter will resch for 03/2019    -we discussed possible serious and likely etiologies, options for evaluation and workup, limitations of telemedicine visit vs in person visit, treatment, treatment risks and precautions. Pt prefers to treat via telemedicine empirically rather then risking or undertaking an in person visit at this moment. Patient agrees to seek prompt in person care if worsening, new symptoms arise, or if is not improving with treatment.   I discussed the assessment and treatment plan with the patient. The patient was provided an opportunity to ask questions and all were answered. The patient agreed with the plan and demonstrated an understanding of the instructions.   The patient was advised to call back or seek an in-person evaluation if the symptoms worsen or if the condition fails to improve as anticipated.  Time spent 30 minutes  Delorise Jackson, MD

## 2019-02-22 ENCOUNTER — Encounter: Payer: Self-pay | Admitting: Internal Medicine

## 2019-02-22 DIAGNOSIS — N390 Urinary tract infection, site not specified: Secondary | ICD-10-CM | POA: Insufficient documentation

## 2019-02-22 MED ORDER — CARVEDILOL 6.25 MG PO TABS: 6.2500 mg | ORAL_TABLET | Freq: Two times a day (BID) | ORAL | 3 refills | Status: DC

## 2019-02-22 MED ORDER — LOSARTAN POTASSIUM-HCTZ 100-25 MG PO TABS: 1.0000 | ORAL_TABLET | Freq: Every day | ORAL | 3 refills | Status: DC

## 2019-02-22 MED ORDER — HYDRALAZINE HCL 25 MG PO TABS: 25.0000 mg | ORAL_TABLET | Freq: Two times a day (BID) | ORAL | 3 refills | Status: DC

## 2019-02-22 MED ORDER — ROPINIROLE HCL 0.5 MG PO TABS: ORAL_TABLET | ORAL | 3 refills | Status: DC

## 2019-02-22 MED ORDER — GABAPENTIN 100 MG PO CAPS: 200.0000 mg | ORAL_CAPSULE | Freq: Three times a day (TID) | ORAL | 3 refills | Status: DC

## 2019-02-22 NOTE — Patient Instructions (Signed)
Dementia Dementia is a condition that affects the way the brain functions. It often affects memory and thinking. Usually, dementia gets worse with time and cannot be reversed (progressive dementia). There are many types of dementia, including:  Alzheimer's disease. This type is the most common.  Vascular dementia. This type may happen as the result of a stroke.  Lewy body dementia. This type may happen to people who have Parkinson's disease.  Frontotemporal dementia. This type is caused by damage to nerve cells (neurons) in certain parts of the brain. Some people may be affected by more than one type of dementia. This is called mixed dementia. What are the causes? Dementia is caused by damage to cells in the brain. The area of the brain and the types of cells damaged determine the type of dementia. Usually, this damage is irreversible or cannot be undone. Some examples of irreversible causes include:  Conditions that affect the blood vessels of the brain, such as diabetes, heart disease, or blood vessel disease.  Genetic mutations. In some cases, changes in the brain may be caused by another condition and can be reversed or slowed. Some examples of reversible causes include:  Injury to the brain.  Certain medicines.  Infection, such as meningitis.  Metabolic problems, such as vitamin B12 deficiency or thyroid disease.  Pressure on the brain, such as from a tumor or blood clot. What are the signs or symptoms? Symptoms of dementia depend on the type of dementia. Common signs of dementia include problems with remembering, thinking, problem solving, decision making, and communicating. These signs develop slowly or get worse with time. This may include:  Problems remembering things.  Having trouble taking a bath or putting clothes on.  Forgetting appointments.  Forgetting to pay bills.  Difficulty planning and preparing meals.  Having trouble speaking.  Getting lost easily. How  is this diagnosed? This condition is diagnosed by a specialist (neurologist). It is diagnosed based on the history of your symptoms, your medical history, a physical exam, and tests. Tests may include:  Tests to evaluate brain function, such as memory tests, cognitive tests, and other tests.  Lab tests, such as blood or urine tests.  Imaging tests, such as a CT scan, a PET scan, or an MRI.  Genetic testing. This may be done if other family members have a diagnosis of certain types of dementia. Your health care provider will talk with you and your family, friends, or caregivers about your history and symptoms. How is this treated?  Treatment for this condition depends on the cause of the dementia. Progressive dementias, such as Alzheimer's disease, cannot be cured, but there may be treatments that help to manage symptoms. Treatment might involve taking medicines that may help to:  Control the dementia.  Slow down the progression of the dementia.  Manage symptoms. In some cases, treating the cause of your dementia can improve symptoms, reverse symptoms, or slow down how quickly your dementia becomes worse. Your health care provider can direct you to support groups, organizations, and other health care providers who can help with decisions about your care. Follow these instructions at home: Medicines  Take over-the-counter and prescription medicines only as told by your health care provider.  Use a pill organizer or pill reminder to help you manage your medicines.  Avoid taking medicines that can affect thinking, such as pain medicines or sleeping medicines. Lifestyle  Make healthy lifestyle choices. ? Be physically active as told by your health care provider. ? Do   not use any products that contain nicotine or tobacco, such as cigarettes, e-cigarettes, and chewing tobacco. If you need help quitting, ask your health care provider. ? Do not drink alcohol. ? Practice stress-management  techniques when you get stressed. ? Spend time with other people.  Make sure to get quality sleep. These tips can help you get a good night's rest: ? Avoid napping during the day. ? Keep your sleeping area dark and cool. ? Avoid exercising during the few hours before you go to bed. ? Avoid caffeine products in the evening. Eating and drinking  Drink enough fluid to keep your urine pale yellow.  Eat a healthy diet. General instructions   Work with your health care provider to determine what you need help with and what your safety needs are.  Talk with your health care provider about whether it is safe for you to drive.  If you were given a bracelet that identifies you as a person with memory loss or tracks your location, make sure to wear it at all times.  Work with your family to make important decisions, such as advance directives, medical power of attorney, or a living will.  Keep all follow-up visits as told by your health care provider. This is important. Where to find more information  Alzheimer's Association: CapitalMile.co.nz  National Institute on Aging: DVDEnthusiasts.nl  World Health Organization: RoleLink.com.br Contact a health care provider if:  You have any new or worsening symptoms.  You have problems with choking or swallowing. Get help right away if:  You feel depressed or sad, or feel that you want to harm yourself.  Your family members become concerned for your safety. If you ever feel like you may hurt yourself or others, or have thoughts about taking your own life, get help right away. You can go to your nearest emergency department or call:  Your local emergency services (911 in the [place]  A suicide crisis helpline, such as the [place] at 707-625-6633. This is open 24 hours a day. Summary  Dementia is a condition that affects the way the brain functions. Dementia often affects memory and thinking.  Usually,  dementia gets worse with time and cannot be reversed (progressive dementia).  Treatment for this condition depends on the cause of the dementia.  Work with your health care provider to determine what you need help with and what your safety needs are.  Your health care provider can direct you to support groups, organizations, and other health care providers who can help with decisions about your care. This information is not intended to replace advice given to you by your health care provider. Make sure you discuss any questions you have with your health care provider. Document Released: 08/26/2000 Document Revised: 05/17/2018 Document Reviewed: 05/17/2018 Elsevier Patient Education  [place].  Memory Compensation Strategies  1. Use "WARM" strategy.  W= write it down  A= associate it  R= repeat it  M= make a mental note  2.   You can keep a Social worker.  Use a 3-ring notebook with sections for the following: calendar, important names and phone numbers,  medications, doctors' names/phone numbers, lists/reminders, and a section to journal what you did  each day.   3.    Use a calendar to write appointments down.  4.    Write yourself a schedule for the day.  This can be placed on the calendar or in a separate section of the Memory Notebook.  Keeping  a  regular schedule can help memory.  5.    Use medication organizer with sections for each day or morning/evening pills.  You may need help loading it  6.    Keep a basket, or pegboard by the door.  Place items that you need to take out with you in the basket or on the pegboard.  You may also want to  include a message board for reminders.  7.    Use sticky notes.  Place sticky notes with reminders in a place where the task is performed.  For example: " turn off the  stove" placed by the stove, "lock the door" placed on the door at eye level, " take your medications" on  the bathroom mirror or by the place where you normally  take your medications.  8.    Use alarms/timers.  Use while cooking to remind yourself to check on food or as a reminder to take your medicine, or as a  reminder to make a call, or as a reminder to perform another task, etc.

## 2019-02-23 ENCOUNTER — Other Ambulatory Visit: Payer: Self-pay | Admitting: Internal Medicine

## 2019-02-23 MED ORDER — LINACLOTIDE 290 MCG PO CAPS: 290.0000 ug | ORAL_CAPSULE | Freq: Every day | ORAL | 0 refills | Status: DC

## 2019-02-23 NOTE — Telephone Encounter (Signed)
Medication Refill - Medication: linaclotide (LINZESS) 290 MCG CAPS capsule   Has the patient contacted their pharmacy? Yes.   (Agent: If no, request that the patient contact the pharmacy for the refill.) (Agent: If yes, when and what did the pharmacy advise?)  Preferred Pharmacy (with phone number or street name):  [facility] [place]  [address] [place] [address]  Phone: [phone] Fax: [phone]     Agent: Please be advised that RX refills may take up to 3 business days. We ask that you follow-up with your pharmacy.

## 2019-02-24 ENCOUNTER — Other Ambulatory Visit: Payer: Self-pay | Admitting: Internal Medicine

## 2019-02-24 DIAGNOSIS — I10 Essential (primary) hypertension: Secondary | ICD-10-CM

## 2019-02-24 DIAGNOSIS — K581 Irritable bowel syndrome with constipation: Secondary | ICD-10-CM

## 2019-02-24 DIAGNOSIS — G2581 Restless legs syndrome: Secondary | ICD-10-CM

## 2019-02-24 MED ORDER — LINACLOTIDE 290 MCG PO CAPS: 290.0000 ug | ORAL_CAPSULE | Freq: Every day | ORAL | 3 refills | Status: AC

## 2019-02-24 MED ORDER — LOSARTAN POTASSIUM-HCTZ 100-25 MG PO TABS: 1.0000 | ORAL_TABLET | Freq: Every day | ORAL | 3 refills | Status: DC

## 2019-02-24 MED ORDER — HYDRALAZINE HCL 25 MG PO TABS: 25.0000 mg | ORAL_TABLET | Freq: Two times a day (BID) | ORAL | 3 refills | Status: AC

## 2019-02-24 MED ORDER — AMLODIPINE BESYLATE 5 MG PO TABS: 5.0000 mg | ORAL_TABLET | Freq: Every day | ORAL | 3 refills | Status: AC

## 2019-02-24 MED ORDER — GABAPENTIN 100 MG PO CAPS: 200.0000 mg | ORAL_CAPSULE | Freq: Three times a day (TID) | ORAL | 3 refills | Status: AC

## 2019-02-24 MED ORDER — ROPINIROLE HCL 0.5 MG PO TABS: ORAL_TABLET | ORAL | 3 refills | Status: AC

## 2019-02-24 MED ORDER — CARVEDILOL 6.25 MG PO TABS: 6.2500 mg | ORAL_TABLET | Freq: Two times a day (BID) | ORAL | 3 refills | Status: AC

## 2019-03-12 ENCOUNTER — Other Ambulatory Visit: Payer: Self-pay

## 2019-03-12 ENCOUNTER — Emergency Department: Payer: Medicare Other

## 2019-03-12 ENCOUNTER — Emergency Department
Admission: EM | Admit: 2019-03-12 | Discharge: 2019-03-12 | Disposition: A | Discharge status: Home / Self Care | Payer: Medicare Other | Attending: Emergency Medicine | Admitting: Emergency Medicine

## 2019-03-12 DIAGNOSIS — R52 Pain, unspecified: Secondary | ICD-10-CM | POA: Diagnosis not present

## 2019-03-12 DIAGNOSIS — Y92018 Other place in single-family (private) house as the place of occurrence of the external cause: Secondary | ICD-10-CM | POA: Insufficient documentation

## 2019-03-12 DIAGNOSIS — Z79899 Other long term (current) drug therapy: Secondary | ICD-10-CM | POA: Insufficient documentation

## 2019-03-12 DIAGNOSIS — Z043 Encounter for examination and observation following other accident: Secondary | ICD-10-CM | POA: Insufficient documentation

## 2019-03-12 DIAGNOSIS — I10 Essential (primary) hypertension: Secondary | ICD-10-CM | POA: Diagnosis not present

## 2019-03-12 DIAGNOSIS — F0390 Unspecified dementia without behavioral disturbance: Secondary | ICD-10-CM | POA: Diagnosis not present

## 2019-03-12 DIAGNOSIS — W19XXXA Unspecified fall, initial encounter: Secondary | ICD-10-CM | POA: Insufficient documentation

## 2019-03-12 DIAGNOSIS — Z9114 Patient's other noncompliance with medication regimen: Secondary | ICD-10-CM | POA: Insufficient documentation

## 2019-03-12 DIAGNOSIS — Y999 Unspecified external cause status: Secondary | ICD-10-CM | POA: Diagnosis not present

## 2019-03-12 DIAGNOSIS — Z85828 Personal history of other malignant neoplasm of skin: Secondary | ICD-10-CM | POA: Insufficient documentation

## 2019-03-12 DIAGNOSIS — Y9389 Activity, other specified: Secondary | ICD-10-CM | POA: Diagnosis not present

## 2019-03-12 DIAGNOSIS — S199XXA Unspecified injury of neck, initial encounter: Secondary | ICD-10-CM | POA: Diagnosis not present

## 2019-03-12 DIAGNOSIS — S0990XA Unspecified injury of head, initial encounter: Secondary | ICD-10-CM | POA: Diagnosis not present

## 2019-03-12 LAB — CBC WITH DIFFERENTIAL/PLATELET
Abs Immature Granulocytes: 0.04 10*3/uL (ref 0.00–0.07)
Basophils Absolute: 0 10*3/uL (ref 0.0–0.1)
Basophils Relative: 1 %
Eosinophils Absolute: 0.1 10*3/uL (ref 0.0–0.5)
Eosinophils Relative: 1 %
HCT: 40.6 % (ref 36.0–46.0)
Hemoglobin: 13.6 g/dL (ref 12.0–15.0)
Immature Granulocytes: 1 %
Lymphocytes Relative: 15 %
Lymphs Abs: 1.2 10*3/uL (ref 0.7–4.0)
MCH: 28.9 pg (ref 26.0–34.0)
MCHC: 33.5 g/dL (ref 30.0–36.0)
MCV: 86.4 fL (ref 80.0–100.0)
Monocytes Absolute: 0.6 10*3/uL (ref 0.1–1.0)
Monocytes Relative: 8 %
Neutro Abs: 6.2 10*3/uL (ref 1.7–7.7)
Neutrophils Relative %: 74 %
Platelets: 195 10*3/uL (ref 150–400)
RBC: 4.7 MIL/uL (ref 3.87–5.11)
RDW: 13.2 % (ref 11.5–15.5)
WBC: 8.3 10*3/uL (ref 4.0–10.5)
nRBC: 0 % (ref 0.0–0.2)

## 2019-03-12 LAB — BASIC METABOLIC PANEL
Anion gap: 12 (ref 5–15)
BUN: 19 mg/dL (ref 8–23)
CO2: 23 mmol/L (ref 22–32)
Calcium: 9.7 mg/dL (ref 8.9–10.3)
Chloride: 105 mmol/L (ref 98–111)
Creatinine, Ser: 0.72 mg/dL (ref 0.44–1.00)
GFR calc Af Amer: >60 mL/min (ref >60)
GFR calc non Af Amer: >60 mL/min (ref >60)
Glucose, Bld: 116 mg/dL — ABNORMAL HIGH (ref 70–99)
Potassium: 3.9 mmol/L (ref 3.5–5.1)
Sodium: 140 mmol/L (ref 135–145)

## 2019-03-12 LAB — URINALYSIS, COMPLETE (UACMP) WITH MICROSCOPIC
Bacteria, UA: NONE SEEN
Bilirubin Urine: NEGATIVE
Glucose, UA: NEGATIVE mg/dL
Ketones, ur: NEGATIVE mg/dL
Leukocytes,Ua: NEGATIVE
Nitrite: NEGATIVE
Protein, ur: NEGATIVE mg/dL
Specific Gravity, Urine: 1.005 (ref 1.005–1.030)
Squamous Epithelial / HPF: NONE SEEN (ref 0–5)
WBC, UA: NONE SEEN WBC/hpf (ref 0–5)
pH: 7 (ref 5.0–8.0)

## 2019-03-12 LAB — CK
Total CK: 783 U/L — ABNORMAL HIGH (ref 38–234)
Total CK: 791 U/L — ABNORMAL HIGH (ref 38–234)

## 2019-03-12 MED ORDER — HYDRALAZINE HCL 50 MG PO TABS
25.0000 mg | ORAL_TABLET | Freq: Once | ORAL | Status: AC
  Administered 2019-03-12: 25 mg via ORAL
  Filled 2019-03-12: qty 1

## 2019-03-12 MED ORDER — SODIUM CHLORIDE 0.9 % IV BOLUS
1000.0000 mL | Freq: Once | INTRAVENOUS | Status: AC
  Administered 2019-03-12: 1000 mL via INTRAVENOUS

## 2019-03-12 MED ORDER — CARVEDILOL 6.25 MG PO TABS
6.2500 mg | ORAL_TABLET | Freq: Once | ORAL | Status: AC
  Administered 2019-03-12: 6.25 mg via ORAL
  Filled 2019-03-12: qty 1

## 2019-03-12 MED ORDER — AMLODIPINE BESYLATE 5 MG PO TABS
5.0000 mg | ORAL_TABLET | Freq: Once | ORAL | Status: AC
  Administered 2019-03-12: 5 mg via ORAL
  Filled 2019-03-12: qty 1

## 2019-03-12 NOTE — ED Triage Notes (Signed)
Patient to Rm 12 via EMS from home.  Patient with a fall earlier tonight and laid in floor since 8 pm then called family a short time prior to coming to the ED.  EMS also reports patient with elevated BP.  Patient states she has not taken her blood pressure medication in approximately a week because she kept forgetting it.EMS vital signs - hr 83, bp 206/102.

## 2019-03-12 NOTE — ED Notes (Signed)
This RN spoke with pt's daughter and discussed pt's discharge. Daughter stated that she will be here in approx 45 to pick the pt up. Daughter also expressed concerns of pt having UTI. EDP informed and order for urinalysis placed and specimen sent for analysis.

## 2019-03-12 NOTE — ED Notes (Signed)
Patient resting quietly at this time.

## 2019-03-12 NOTE — ED Provider Notes (Signed)
-----------------------------------------   9:43 AM on 03/12/2019 -----------------------------------------  Patient care assumed from Dr. Alfred Levins.  Patient's repeat CK largely unchanged from prior.  Patient's medical work-up otherwise largely nonrevealing.  I believe the patient is safe for discharge home with PCP follow-up.  Daughter will be coming to pick up the patient.  I have discussed with the patient continue to drink plenty of fluids over the next 2 days.  Patient agreeable.   Harvest Dark, MD 03/12/19 [phone]

## 2019-03-12 NOTE — ED Provider Notes (Signed)
[facility] Emergency Department Provider Note  ____________________________________________  Time seen: Approximately 7:42 AM  I have reviewed the triage vital signs and the nursing notes.   HISTORY  Chief Complaint Fall and Hypertension   HPI Kristine Jacobson is a 85 y.o. female with a history of dementia who presents for evaluation after a fall.  Patient reports that she fell asleep on her recliner.  She got up to turn off the Christmas tree's lights when she tripped and fell.  She was on the floor from 8 PM until arrival to the emergency room at 4 AM.  She eventually was able to drag herself to the phone to call for her daughter's help.  Patient denies any pain.  Denies head trauma, back pain or hip pain, neck pain, chest pain, abdominal pain.  Patient's blood pressure is elevated as she reports that she has not been taking her blood pressure medication because she forgets to take it.  Patient reports that she has a lifeline button but she took it off today.    Past Medical History:  Diagnosis Date  . Allergy    seasonal   . Arthritis    shoulders, hands  . Cancer ([place])    [place] face-Dr. Evorn Jacobson   . Chronic neck pain    2/2 bulging discs   . HBP (high blood pressure)   . Hearing aid worn    bilateral  . Macular degeneration    dx'ed 2015 dry now left wet and right mild wet Kristine Jacobson  . Macular degeneration, age related   . Osteoporosis   . Overactive bladder   . Restless leg   . SBO (small bowel obstruction) ([place])    01/2017, 02/2018  . Spinal stenosis   . Urinary incontinence    s/p monthly PTNS with Dr. Jacqlyn Jacobson, no help with overactive bladder with meds nor Botox   . UTI (urinary tract infection)   . Vertigo    no episodes for several years  . Wears dentures    partial upper and lower    Patient Active Problem List   Diagnosis Date Noted  . Chronic UTI 02/22/2019  . GERD (gastroesophageal reflux disease) 11/22/2018  . Lumbar  disc disease 11/22/2018  . Skin cancer 11/22/2018  . Memory loss 07/08/2018  . SBO (small bowel obstruction) ([place]) 04/08/2018  . Other constipation 04/08/2018  . Chronic venous insufficiency 01/18/2018  . History of skin cancer 12/24/2017  . Osteoporosis 12/24/2017  . Atherosclerosis of native arteries of the extremities with ulceration ([place]) 09/17/2017  . Bilateral leg edema 08/17/2017  . DDD (degenerative disc disease), cervical 08/04/2017  . Foraminal stenosis of cervical region 08/04/2017  . Chondrocalcinosis of left knee 08/04/2017  . Abnormal gait 08/04/2017  . Atherosclerosis of aorta ([place]) 08/04/2017  . Ulcer of left lower extremity ([place]) 08/04/2017  . Neuropathy 06/28/2017  . Hypertension 03/03/2017  . Macular degeneration 03/03/2017  . Overactive bladder 03/03/2017  . Restless leg syndrome 03/03/2017  . Spinal stenosis 03/03/2017  . Osteoarthritis 03/03/2017  . Low back pain 03/03/2017  . Closed displaced intertrochanteric fracture of left femur with routine healing 05/26/2015  . Fracture of left hip requiring operative repair ([place]) 04/14/2015  . Closed left hip fracture ([place]) 02/27/2015  . SUI (stress urinary incontinence, female) 09/21/2014  . Flank pain, acute 11/22/2013  . Chronic cystitis 04/12/2013  . Incomplete emptying of bladder 04/12/2013  . Increased frequency of urination 04/12/2013  . Urge incontinence 04/12/2013  Past Surgical History:  Procedure Laterality Date  . ABDOMINAL HYSTERECTOMY     1967  . APPENDECTOMY     1967  . BLADDER SURGERY     bladder suspension; 2005, 2011-Kristine Jacobson  . BREAST EXCISIONAL BIOPSY Right 1970   neg  . BROW LIFT Bilateral 12/01/2016   Procedure: BLEPHAROPLASTY upper eyelid with excess skin;  Surgeon: Kristine Starch, MD;  Location: [place];  Service: Ophthalmology;  Laterality: Bilateral;  MAC  . CARPAL TUNNEL RELEASE     right 2016   . Esophageal dilitation    . eyelid surgery     2018  . FRACTURE  SURGERY    . HIP SURGERY     left 2016   . INTRAMEDULLARY (IM) NAIL INTERTROCHANTERIC Left 02/27/2015   Procedure: INTRAMEDULLARY (IM) NAIL INTERTROCHANTRIC;  Surgeon: Kristine Leep, MD;  Location: [facility];  Service: Orthopedics;  Laterality: Left;  . PTOSIS REPAIR Bilateral 12/01/2016   Procedure: PTOSIS REPAIR  repair resect ex;  Surgeon: Kristine Starch, MD;  Location: [place];  Service: Ophthalmology;  Laterality: Bilateral;  . TONSILLECTOMY      Prior to Admission medications   Medication Sig Start Date End Date Taking? Authorizing Provider  acetaminophen (TYLENOL) 500 MG tablet Take 1,000 mg by mouth 2 (two) times daily as needed (pain).     [provider]  amLODipine (NORVASC) 5 MG tablet Take 1 tablet (5 mg total) by mouth daily. 02/24/19   Jacobson, Kristine Glow, MD  Calcium Carb-Cholecalciferol (CALCIUM 600 + D PO) Take 600 mg by mouth 2 (two) times daily.    [provider]  Calcium Carbonate-Vitamin D (CALTRATE 600+D) 600-400 MG-UNIT tablet Take by mouth.    [provider]  carvedilol (COREG) 6.25 MG tablet Take 1 tablet (6.25 mg total) by mouth 2 (two) times daily with a meal. 02/24/19   Jacobson, Kristine Glow, MD  doxycycline (VIBRA-TABS) 100 MG tablet Take 1 tablet (100 mg total) by mouth daily. 02/21/19   Jacobson, Kristine Glow, MD  gabapentin (NEURONTIN) 100 MG capsule Take 2 capsules (200 mg total) by mouth 3 (three) times daily. 02/24/19   Jacobson, Kristine Glow, MD  hydrALAZINE (APRESOLINE) 25 MG tablet Take 1 tablet (25 mg total) by mouth 2 (two) times daily. 02/24/19   Jacobson, Kristine Glow, MD  linaclotide Rolan Lipa) 290 MCG CAPS capsule Take 1 capsule (290 mcg total) by mouth daily before breakfast. 02/24/19   Jacobson, Kristine Glow, MD  loratadine (CLARITIN) 10 MG tablet Take 10 mg by mouth as needed.    [provider]  losartan-hydrochlorothiazide (HYZAAR) 100-25 MG tablet Take 1 tablet by mouth daily.  02/24/19   Jacobson, Kristine Glow, MD  Multiple Vitamin (MULTIVITAMIN WITH MINERALS) TABS tablet Take 1 tablet by mouth daily. Centrum Silver    [provider]  Multiple Vitamins-Minerals (PRESERVISION AREDS 2) CAPS Take 1 capsule by mouth 2 (two) times daily.    [provider]  Propylene Glycol (SYSTANE BALANCE OP) Place 1 drop into both eyes 3 (three) times daily as needed (dry eyes).    [provider]  rOPINIRole (REQUIP) 0.5 MG tablet TAKE 1 TABLET BY MOUTH IN THE MORNING, 1 TABLET BY MOUTH AT LUNCH, AND 1 TABLET BY MOUTH IN THE EVENING. 02/24/19   Jacobson, Kristine Glow, MD  traMADol (ULTRAM) 50 MG tablet Take 1 tablet (50 mg total) by mouth every 6 (six) hours as needed for moderate pain. Patient taking differently: Take 50 mg by mouth  daily as needed for moderate pain.  09/15/15   [place] Lions, PA-C    Allergies Macrobid [nitrofurantoin macrocrystal] and Codeine  Family History  Problem Relation Age of Onset  . Diabetes Mother   . Arthritis Mother   . Stroke Mother   . Heart disease Father   . Diabetes Sister   . Cancer Brother        lung 2/2 asbestos   . Diabetes Daughter   . Cancer Daughter        DCIS breast   . Diabetes Brother   . Diabetes Brother   . Heart attack Grandson        died age 73 in  May 08, 2017  . Breast cancer Neg Hx     Social History Social History   Tobacco Use  . Smoking status: Never Smoker  . Smokeless tobacco: Never Used  Substance Use Topics  . Alcohol use: No  . Drug use: No    Review of Systems  Constitutional: Negative for fever. Eyes: Negative for visual changes. ENT: Negative for facial injury or neck injury Cardiovascular: Negative for chest injury. Respiratory: Negative for shortness of breath. Negative for chest wall injury. Gastrointestinal: Negative for abdominal pain or injury. Genitourinary: Negative for dysuria. Musculoskeletal: Negative for back injury, negative for arm or leg  pain. Skin: Negative for laceration/abrasions. Neurological: Negative for head injury.   ____________________________________________   PHYSICAL EXAM:  VITAL SIGNS: ED Triage Vitals  Enc Vitals Group     BP 03/12/19 0444 (!) 227/95     Pulse Rate 03/12/19 0444 88     Resp 03/12/19 0444 19     Temp 03/12/19 0444 97.6 F (36.4 C)     Temp Source 03/12/19 0444 Oral     SpO2 03/12/19 0444 98 %     Weight 03/12/19 0445 147 lb 11.3 oz (67 kg)     Height 03/12/19 0445 _0  (1.676 m)     Head Circumference --      Peak Flow --      Pain Score 03/12/19 0451 8     Pain Loc --      Pain Edu? --      Excl. in [place]? --     Full spinal precautions maintained throughout the trauma exam. Constitutional: Alert and oriented. No acute distress. Does not appear intoxicated. HEENT Head: Normocephalic and atraumatic. Face: No facial bony tenderness. Stable midface Ears: No hemotympanum bilaterally. No Battle sign Eyes: No eye injury. PERRL. No raccoon eyes Nose: Nontender. No epistaxis. No rhinorrhea Mouth/Throat: Mucous membranes are moist. No oropharyngeal blood. No dental injury. Airway patent without stridor. Normal voice. Neck: no C-collar. No midline c-spine tenderness.  Cardiovascular: Normal rate, regular rhythm. Normal and symmetric distal pulses are present in all extremities. Pulmonary/Chest: Chest wall is stable and nontender to palpation/compression. Normal respiratory effort. Breath sounds are normal. No crepitus.  Abdominal: Soft, nontender, non distended. Musculoskeletal: Nontender with normal full range of motion in all extremities. No deformities. No thoracic or lumbar midline spinal tenderness. Pelvis is stable. Skin: Skin is warm, dry and intact. No abrasions or contutions. Psychiatric: Speech and behavior are appropriate. Neurological: Normal speech and language. Moves all extremities to command. No gross focal neurologic deficits are appreciated.  Glascow Coma Score: 4  - Opens eyes on own 6 - Follows simple motor commands 5 - Alert and oriented GCS: 15   ____________________________________________   LABS (all labs ordered are listed, but only abnormal results are displayed)  Labs Reviewed  CK - Abnormal; Notable for the following components:      Result Value   Total CK 783 (*)    All other components within normal limits  BASIC METABOLIC PANEL - Abnormal; Notable for the following components:   Glucose, Bld 116 (*)    All other components within normal limits  CBC WITH DIFFERENTIAL/PLATELET  CK   ____________________________________________  EKG  ED ECG REPORT I, Rudene Re, the attending physician, personally viewed and interpreted this ECG.  NSR, rate 71, normal intervals, normal axis, no STE or depression ____________________________________________  RADIOLOGY  I have personally reviewed the images performed during this visit and I agree with the Radiologist's read.   Interpretation by Radiologist:  CT Head Wo Contrast  Result Date: 03/12/2019 CLINICAL DATA:  85 year old female status post fall, found down. Hypertensive, 419 systolic. EXAM: CT HEAD WITHOUT CONTRAST TECHNIQUE: Contiguous axial images were obtained from the base of the skull through the vertex without intravenous contrast. COMPARISON:  Head CT 09/02/2018. FINDINGS: Brain: Stable cerebral volume. No midline shift, ventriculomegaly, mass effect, evidence of mass lesion, intracranial hemorrhage or evidence of cortically based acute infarction. Patchy and confluent bilateral cerebral white matter hypodensity has not significantly changed. Mild hypodensity in the right basal ganglia is stable. Otherwise normal for age gray-white matter differentiation. Vascular: Calcified atherosclerosis at the skull base. No suspicious intracranial vascular hyperdensity. Skull: No acute osseous abnormality identified. Sinuses/Orbits: Visualized paranasal sinuses and mastoids are  clear. Other: No acute orbit or scalp soft tissue findings. IMPRESSION: Stable non contrast CT appearance of the brain since 2019. No acute intracranial abnormality or acute traumatic injury identified. Electronically Signed   By: Genevie Ann M.D.   On: 03/12/2019 06:50   CT Cervical Spine Wo Contrast  Result Date: 03/12/2019 CLINICAL DATA:  85 year old female status post fall, found down. Hypertensive, 622 systolic. EXAM: CT CERVICAL SPINE WITHOUT CONTRAST TECHNIQUE: Multidetector CT imaging of the cervical spine was performed without intravenous contrast. Multiplanar CT image reconstructions were also generated. COMPARISON:  Cervical spine radiographs 06/28/2017. Head CT today reported separately. FINDINGS: Alignment: Multilevel mild cervical spondylolisthesis and straightening of lordosis are stable since 2019. Bilateral posterior element alignment is within normal limits. Skull base and vertebrae: Background osteopenia. Visualized skull base is intact. No atlanto-occipital dissociation. No acute osseous abnormality identified. Soft tissues and spinal canal: No prevertebral fluid or swelling. No visible canal hematoma. Calcified carotid atherosclerosis but otherwise negative noncontrast neck soft tissues. Disc levels: Bulky partially calcified degenerative ligamentous hypertrophy about the odontoid with associated odontoid and C1 subchondral cysts. Degenerative appearing ankylosis at C5-C6. Widespread severe cervical facet arthropathy, greater on the right. Severe chronic disc and endplate degeneration W9-N9 and C7-T1. But capacious cervical spinal canal with mild if any degenerative spinal stenosis. Upper chest: Visible upper thoracic levels appear grossly intact. Negative lung apices. IMPRESSION: 1.  No acute traumatic injury identified in the cervical spine. 2. Widespread cervical spine degeneration but only mild if any degenerative spinal stenosis. Electronically Signed   By: Genevie Ann M.D.   On: 03/12/2019  06:54     ____________________________________________   PROCEDURES  Procedure(s) performed: None Procedures Critical Care performed:  None ____________________________________________   INITIAL IMPRESSION / ASSESSMENT AND PLAN / ED COURSE   85 y.o. female with a history of dementia who presents for evaluation after a fall. Patient lives alone and was down for several hours.  She did not sustain any injuries based on physical and history.  However due to her  age head CT and CT spine were done which were both negative.  Her labs show no leukocytosis, normal kidney function, no electrolyte derangements.  Her CK slightly elevated.  She received IV fluids and will repeat a CK in 3 hours to make sure patient is not going into rhabdo from being down for several hours.  At that point if her CK is not significantly higher patient will be stable for discharge home.  She does have a lifeline button at home and her daughter is here in the waiting room.       Please note:  Patient was evaluated in Emergency Department today for the symptoms described in the history of present illness. Patient was evaluated in the context of the global COVID-19 pandemic, which necessitated consideration that the patient might be at risk for infection with the SARS-CoV-2 virus that causes COVID-19. Institutional protocols and algorithms that pertain to the evaluation of patients at risk for COVID-19 are in a state of rapid change based on information released by regulatory bodies including the CDC and federal and state organizations. These policies and algorithms were followed during the patient's care in the ED.  Some ED evaluations and interventions may be delayed as a result of limited staffing during the pandemic.   As part of my medical decision making, I reviewed the following data within the [place] notes reviewed and incorporated, Labs reviewed , EKG interpreted , Old chart reviewed,  Radiograph reviewed , Notes from prior ED visits and [place] Controlled Substance Database   ____________________________________________   FINAL CLINICAL IMPRESSION(S) / ED DIAGNOSES   Final diagnoses:  Fall, initial encounter      NEW MEDICATIONS STARTED DURING THIS VISIT:  ED Discharge Orders    None       Note:  This document was prepared using Dragon voice recognition software and may include unintentional dictation errors.    Alfred Levins, [place], MD 03/12/19 [phone]

## 2019-03-12 NOTE — Discharge Instructions (Signed)
As we discussed please drink plenty of fluids over the next 24 to 48 hours.  Please follow-up with your doctor in the next 1 to 2 days for recheck/reevaluation.  Return to the emergency department for any symptoms personally concerning to yourself.

## 2019-03-13 ENCOUNTER — Encounter: Payer: Self-pay | Admitting: Internal Medicine

## 2019-03-14 ENCOUNTER — Encounter: Payer: Self-pay | Admitting: Internal Medicine

## 2019-03-15 NOTE — Addendum Note (Signed)
Addended by: Orland Mustard on: 03/15/2019 04:51 PM   Modules accepted: Orders

## 2019-03-26 DIAGNOSIS — M519 Unspecified thoracic, thoracolumbar and lumbosacral intervertebral disc disorder: Secondary | ICD-10-CM | POA: Diagnosis not present

## 2019-03-26 DIAGNOSIS — Z20822 Contact with and (suspected) exposure to covid-19: Secondary | ICD-10-CM | POA: Diagnosis not present

## 2019-03-26 DIAGNOSIS — K222 Esophageal obstruction: Secondary | ICD-10-CM | POA: Diagnosis not present

## 2019-03-26 DIAGNOSIS — I10 Essential (primary) hypertension: Secondary | ICD-10-CM | POA: Diagnosis not present

## 2019-03-26 DIAGNOSIS — M810 Age-related osteoporosis without current pathological fracture: Secondary | ICD-10-CM | POA: Diagnosis not present

## 2019-03-26 DIAGNOSIS — M25572 Pain in left ankle and joints of left foot: Secondary | ICD-10-CM | POA: Diagnosis not present

## 2019-03-26 DIAGNOSIS — R410 Disorientation, unspecified: Secondary | ICD-10-CM | POA: Diagnosis not present

## 2019-03-26 DIAGNOSIS — R531 Weakness: Secondary | ICD-10-CM | POA: Diagnosis not present

## 2019-03-26 DIAGNOSIS — M545 Low back pain: Secondary | ICD-10-CM | POA: Diagnosis not present

## 2019-03-26 DIAGNOSIS — G2581 Restless legs syndrome: Secondary | ICD-10-CM | POA: Diagnosis not present

## 2019-03-26 DIAGNOSIS — N3090 Cystitis, unspecified without hematuria: Secondary | ICD-10-CM | POA: Diagnosis not present

## 2019-03-26 DIAGNOSIS — G629 Polyneuropathy, unspecified: Secondary | ICD-10-CM | POA: Diagnosis not present

## 2019-03-26 DIAGNOSIS — R1032 Left lower quadrant pain: Secondary | ICD-10-CM | POA: Diagnosis not present

## 2019-03-26 DIAGNOSIS — G8929 Other chronic pain: Secondary | ICD-10-CM | POA: Diagnosis not present

## 2019-03-26 DIAGNOSIS — N12 Tubulo-interstitial nephritis, not specified as acute or chronic: Secondary | ICD-10-CM | POA: Diagnosis not present

## 2019-03-26 DIAGNOSIS — R4182 Altered mental status, unspecified: Secondary | ICD-10-CM | POA: Diagnosis not present

## 2019-03-26 DIAGNOSIS — M79671 Pain in right foot: Secondary | ICD-10-CM | POA: Diagnosis not present

## 2019-03-26 DIAGNOSIS — G3189 Other specified degenerative diseases of nervous system: Secondary | ICD-10-CM | POA: Diagnosis not present

## 2019-03-26 DIAGNOSIS — I499 Cardiac arrhythmia, unspecified: Secondary | ICD-10-CM | POA: Diagnosis not present

## 2019-03-26 DIAGNOSIS — K219 Gastro-esophageal reflux disease without esophagitis: Secondary | ICD-10-CM | POA: Diagnosis not present

## 2019-03-26 DIAGNOSIS — M25571 Pain in right ankle and joints of right foot: Secondary | ICD-10-CM | POA: Diagnosis not present

## 2019-03-26 DIAGNOSIS — M549 Dorsalgia, unspecified: Secondary | ICD-10-CM | POA: Diagnosis not present

## 2019-03-27 DIAGNOSIS — I10 Essential (primary) hypertension: Secondary | ICD-10-CM | POA: Diagnosis not present

## 2019-03-27 DIAGNOSIS — M25552 Pain in left hip: Secondary | ICD-10-CM | POA: Diagnosis not present

## 2019-03-27 DIAGNOSIS — M545 Low back pain: Secondary | ICD-10-CM | POA: Diagnosis not present

## 2019-03-27 DIAGNOSIS — G8929 Other chronic pain: Secondary | ICD-10-CM | POA: Diagnosis not present

## 2019-03-28 DIAGNOSIS — M545 Low back pain: Secondary | ICD-10-CM | POA: Diagnosis not present

## 2019-03-28 DIAGNOSIS — M25552 Pain in left hip: Secondary | ICD-10-CM | POA: Diagnosis not present

## 2019-03-28 DIAGNOSIS — I10 Essential (primary) hypertension: Secondary | ICD-10-CM | POA: Diagnosis not present

## 2019-03-28 DIAGNOSIS — G8929 Other chronic pain: Secondary | ICD-10-CM | POA: Diagnosis not present

## 2019-03-28 MED ORDER — AMLODIPINE BESYLATE 5 MG PO TABS
7.50 | ORAL_TABLET | ORAL | Status: DC
Start: 2019-03-29 — End: 2019-03-28

## 2019-03-28 MED ORDER — OXYCODONE HCL 5 MG PO TABS
2.50 | ORAL_TABLET | ORAL | Status: DC
Start: ? — End: 2019-03-28

## 2019-03-28 MED ORDER — GABAPENTIN 100 MG PO CAPS
200.00 | ORAL_CAPSULE | ORAL | Status: DC
Start: 2019-03-28 — End: 2019-03-28

## 2019-03-28 MED ORDER — GENERIC EXTERNAL MEDICATION
Status: DC
Start: 2019-03-29 — End: 2019-03-28

## 2019-03-28 MED ORDER — DOXYCYCLINE HYCLATE 100 MG PO TABS
100.00 | ORAL_TABLET | ORAL | Status: DC
Start: 2019-03-28 — End: 2019-03-28

## 2019-03-28 MED ORDER — INFLUENZA VAC SPLIT QUAD 0.5 ML IM SUSY
0.50 | PREFILLED_SYRINGE | INTRAMUSCULAR | Status: DC
Start: ? — End: 2019-03-28

## 2019-03-28 MED ORDER — POLYETHYLENE GLYCOL 3350 17 GM/SCOOP PO POWD
17.00 | ORAL | Status: DC
Start: 2019-03-28 — End: 2019-03-28

## 2019-03-28 MED ORDER — CARVEDILOL 3.125 MG PO TABS
3.13 | ORAL_TABLET | ORAL | Status: DC
Start: 2019-03-28 — End: 2019-03-28

## 2019-03-28 MED ORDER — LIDOCAINE 5 % EX PTCH
1.00 | MEDICATED_PATCH | CUTANEOUS | Status: DC
Start: 2019-03-29 — End: 2019-03-28

## 2019-03-28 MED ORDER — SENNOSIDES 8.6 MG PO TABS
2.00 | ORAL_TABLET | ORAL | Status: DC
Start: 2019-03-28 — End: 2019-03-28

## 2019-03-28 MED ORDER — ACETAMINOPHEN 500 MG PO TABS
1000.00 | ORAL_TABLET | ORAL | Status: DC
Start: 2019-03-28 — End: 2019-03-28

## 2019-03-29 ENCOUNTER — Ambulatory Visit: Payer: Medicare Other

## 2019-03-29 ENCOUNTER — Other Ambulatory Visit: Payer: Medicare Other

## 2019-03-30 ENCOUNTER — Telehealth: Payer: Self-pay

## 2019-03-30 DIAGNOSIS — Z9114 Patient's other noncompliance with medication regimen: Secondary | ICD-10-CM | POA: Diagnosis not present

## 2019-03-30 DIAGNOSIS — M810 Age-related osteoporosis without current pathological fracture: Secondary | ICD-10-CM | POA: Diagnosis not present

## 2019-03-30 DIAGNOSIS — N3946 Mixed incontinence: Secondary | ICD-10-CM | POA: Diagnosis not present

## 2019-03-30 DIAGNOSIS — R4189 Other symptoms and signs involving cognitive functions and awareness: Secondary | ICD-10-CM | POA: Diagnosis not present

## 2019-03-30 DIAGNOSIS — M1990 Unspecified osteoarthritis, unspecified site: Secondary | ICD-10-CM | POA: Diagnosis not present

## 2019-03-30 DIAGNOSIS — R3914 Feeling of incomplete bladder emptying: Secondary | ICD-10-CM | POA: Diagnosis not present

## 2019-03-30 DIAGNOSIS — M5136 Other intervertebral disc degeneration, lumbar region: Secondary | ICD-10-CM | POA: Diagnosis not present

## 2019-03-30 DIAGNOSIS — H353190 Nonexudative age-related macular degeneration, unspecified eye, stage unspecified: Secondary | ICD-10-CM | POA: Diagnosis not present

## 2019-03-30 DIAGNOSIS — M25552 Pain in left hip: Secondary | ICD-10-CM | POA: Diagnosis not present

## 2019-03-30 DIAGNOSIS — K219 Gastro-esophageal reflux disease without esophagitis: Secondary | ICD-10-CM | POA: Diagnosis not present

## 2019-03-30 DIAGNOSIS — N8111 Cystocele, midline: Secondary | ICD-10-CM | POA: Diagnosis not present

## 2019-03-30 DIAGNOSIS — G6289 Other specified polyneuropathies: Secondary | ICD-10-CM | POA: Diagnosis not present

## 2019-03-30 DIAGNOSIS — K222 Esophageal obstruction: Secondary | ICD-10-CM | POA: Diagnosis not present

## 2019-03-30 DIAGNOSIS — Z8781 Personal history of (healed) traumatic fracture: Secondary | ICD-10-CM | POA: Diagnosis not present

## 2019-03-30 DIAGNOSIS — G2581 Restless legs syndrome: Secondary | ICD-10-CM | POA: Diagnosis not present

## 2019-03-30 DIAGNOSIS — I10 Essential (primary) hypertension: Secondary | ICD-10-CM | POA: Diagnosis not present

## 2019-03-30 NOTE — Telephone Encounter (Signed)
carvedilol 3.25 BID on discharge paperwork. The old one was doubled. Home Health PT would like to know which it is. Lisartan/HCTZ 100-12.5 daily on discharge paperwork. Previously was 100-25 daily. Hydralazine was 25mg  and it isnt on discharge paperwork. They would like to know what dose of medication is she taking of the three. Please advise.   [phone] Secured line.

## 2019-03-30 NOTE — Telephone Encounter (Signed)
Kristine Jacobson [phone] called back about allergy alert pt is alert to codeine and she was put on oxcycodine in the hosp. She want to know if that's ok? Please advise and Thank you!

## 2019-04-02 NOTE — Telephone Encounter (Signed)
I need accurate details of med list   Please call daughter Cheryl/pt/H/H RN Jatana  Pt has memory problems or speak to home health RN Glory Buff   We need to know all bottles in the home and which medications she is taking currently  -if she is allergy to codeine what is the allergy ?  -is she in pain to need oxycodone?   Call out all of medications she has in her possession at home?   What is her blood pressure running ?   [place]

## 2019-04-03 ENCOUNTER — Telehealth: Payer: Self-pay | Admitting: Internal Medicine

## 2019-04-03 NOTE — Telephone Encounter (Signed)
Left message for daughter to return call  to office.

## 2019-04-03 NOTE — Telephone Encounter (Signed)
Spoke with patient daughter they are not using the Oxycodone they are using tylenol and yes daughter will be with patient for appointment with medication.

## 2019-04-03 NOTE — Telephone Encounter (Signed)
Needs verbal order on skilled nursing. Visit frequency 2x1 wk, 1x4 weeks

## 2019-04-03 NOTE — Telephone Encounter (Signed)
As to there are several unanswered Medication question per Physical Therapist Glory Buff ( not a nurse) and the daughter has questions concerning meds I have ask for daughter to cal me back to setup a virtual for medication review with PCP. Patient allergy to codeine is rash.awaiting cal from daughter to schedule appointment.

## 2019-04-03 NOTE — Telephone Encounter (Signed)
For appt 04/05/19 please make daughter Kristine Jacobson aware someone will need to be a pts home looking at medication bottles to clarify what she is/should and should not be taking    If she gets rash with codeine this could be risk for oxycodone given to her hospital discharge for pain  Tylenol would be safe if she is tolerating this for pain to take   [name]

## 2019-04-05 ENCOUNTER — Ambulatory Visit (INDEPENDENT_AMBULATORY_CARE_PROVIDER_SITE_OTHER): Payer: Medicare Other | Admitting: Internal Medicine

## 2019-04-05 ENCOUNTER — Other Ambulatory Visit: Payer: Self-pay

## 2019-04-05 ENCOUNTER — Telehealth: Payer: Self-pay | Admitting: Internal Medicine

## 2019-04-05 VITALS — BP 142/60 | Ht 66.0 in | Wt 147.7 lb

## 2019-04-05 DIAGNOSIS — M1990 Unspecified osteoarthritis, unspecified site: Secondary | ICD-10-CM

## 2019-04-05 DIAGNOSIS — N3020 Other chronic cystitis without hematuria: Secondary | ICD-10-CM | POA: Diagnosis not present

## 2019-04-05 DIAGNOSIS — I10 Essential (primary) hypertension: Secondary | ICD-10-CM | POA: Diagnosis not present

## 2019-04-05 DIAGNOSIS — M545 Low back pain, unspecified: Secondary | ICD-10-CM

## 2019-04-05 DIAGNOSIS — M519 Unspecified thoracic, thoracolumbar and lumbosacral intervertebral disc disorder: Secondary | ICD-10-CM

## 2019-04-05 DIAGNOSIS — R413 Other amnesia: Secondary | ICD-10-CM

## 2019-04-05 DIAGNOSIS — N390 Urinary tract infection, site not specified: Secondary | ICD-10-CM

## 2019-04-05 DIAGNOSIS — M4800 Spinal stenosis, site unspecified: Secondary | ICD-10-CM

## 2019-04-05 NOTE — Telephone Encounter (Signed)
Nancy [phone] called from [facility] regarding verbal orders for OT 2 week 2 ,1 week 1.

## 2019-04-05 NOTE — Telephone Encounter (Signed)
Verbal has been given. 

## 2019-04-05 NOTE — Progress Notes (Signed)
Telephone Note  I connected with Kristine Jacobson and [employer]  on 04/05/19 at  3:15 PM EST telephone and verified that I am speaking with the correct person using two identifiers. Location patient: home Location provider:work or home office Persons participating in the virtual visit: patient, provider, pts daughter   I discussed the limitations of evaluation and management by telemedicine and the availability of in person appointments. The patient expressed understanding and agreed to proceed.   HPI: 1/10-1/12/21 went to [facility] ED for back pain and chronic cystitis and elevated BP This appt was f/u and clarify med list with daughter our medications are correct but [facility] ED med list is not correct   Memory loss and health declining daughter thinks pt is ready to go and she is doing better about remembering to take medications now daughters pastors wife will sit with pt 2x/week  -currently she is getting [facility] nurse, PT/OT and using a shower chair to bath of note and has handicap bars in her shower  Low back pain middle 6-7/10 today with arthritis/deg changes noted on imaging lidocaine and tylenol help she never took oxycodone and daughter wants to avoid as cheryls son had opiate issue/addiction. Xray with deg changes left femoral head and collapse daughter wants to avoid surgery and ortho for now she previously had injections with Dr. Orpah Melter in the past and has upcoming f/u   HTN BP sbp 200s8/80s in the ED thought 2/2 pain improved currently on norvasc 5 mg qd, coreg 6.25 mg bid, losartan/hctz 100-25 and hydralazine 25 mg bid pt is now remembering to take medications as family put a camera in her home and able to remind her to take meds she had missed 6 days of meds   Chronic uti needs to f/u [facility] urology on doxycycline 100 mg qd but h/o R to Tetracyclines   ROS: See pertinent positives and negatives per HPI. Psych: denies depression   Past Medical History:  Diagnosis Date  . Allergy    seasonal   .  Arthritis    shoulders, hands  . Cancer ([place])    [place] face-Dr. Evorn Gong   . Chronic neck pain    2/2 bulging discs   . HBP (high blood pressure)   . Hearing aid worn    bilateral  . Macular degeneration    dx'ed 2015 dry now left wet and right mild wet [facility] Dr. Michelene Heady  . Macular degeneration, age related   . Osteoporosis   . Overactive bladder   . Restless leg   . SBO (small bowel obstruction) ([place])    01/2017, 02/2018  . Spinal stenosis   . Urinary incontinence    s/p monthly PTNS with Dr. Jacqlyn Larsen, no help with overactive bladder with meds nor Botox   . UTI (urinary tract infection)   . Vertigo    no episodes for several years  . Wears dentures    partial upper and lower    Past Surgical History:  Procedure Laterality Date  . ABDOMINAL HYSTERECTOMY     1967  . APPENDECTOMY     1967  . BLADDER SURGERY     bladder suspension; 2005, 2011-Dr. Cope  . BREAST EXCISIONAL BIOPSY Right 1970   neg  . BROW LIFT Bilateral 12/01/2016   Procedure: BLEPHAROPLASTY upper eyelid with excess skin;  Surgeon: Karle Starch, MD;  Location: [place];  Service: Ophthalmology;  Laterality: Bilateral;  MAC  . CARPAL TUNNEL RELEASE     right 2016   .  Esophageal dilitation    . eyelid surgery     04-30-16  . FRACTURE SURGERY    . HIP SURGERY     left 04/30/14   . INTRAMEDULLARY (IM) NAIL INTERTROCHANTERIC Left 02/27/2015   Procedure: INTRAMEDULLARY (IM) NAIL INTERTROCHANTRIC;  Surgeon: Dereck Leep, MD;  Location: [facility];  Service: Orthopedics;  Laterality: Left;  . PTOSIS REPAIR Bilateral 12/01/2016   Procedure: PTOSIS REPAIR  repair resect ex;  Surgeon: Karle Starch, MD;  Location: [place];  Service: Ophthalmology;  Laterality: Bilateral;  . TONSILLECTOMY      Family History  Problem Relation Age of Onset  . Diabetes Mother   . Arthritis Mother   . Stroke Mother   . Heart disease Father   . Diabetes Sister   . Cancer Brother        lung 2/2 asbestos   .  Diabetes Daughter   . Cancer Daughter        DCIS breast   . Diabetes Brother   . Diabetes Brother   . Heart attack Grandson        died age 65 in  04/30/17  . Breast cancer Neg Hx     SOCIAL HX: lives at home alone  2 daughters     Current Outpatient Medications:  .  acetaminophen (TYLENOL) 500 MG tablet, Take 1,000 mg by mouth 2 (two) times daily as needed (pain). , Disp: , Rfl:  .  amLODipine (NORVASC) 5 MG tablet, Take 1 tablet (5 mg total) by mouth daily., Disp: 90 tablet, Rfl: 3 .  bisacodyl (DULCOLAX) 5 MG EC tablet, Take 5 mg by mouth daily as needed for moderate constipation., Disp: , Rfl:  .  Calcium Carb-Cholecalciferol (CALCIUM 600 + D PO), Take 600 mg by mouth 2 (two) times daily., Disp: , Rfl:  .  Calcium Carbonate-Vitamin D (CALTRATE 600+D) 600-400 MG-UNIT tablet, Take by mouth., Disp: , Rfl:  .  carvedilol (COREG) 6.25 MG tablet, Take 1 tablet (6.25 mg total) by mouth 2 (two) times daily with a meal., Disp: 180 tablet, Rfl: 3 .  doxycycline (VIBRA-TABS) 100 MG tablet, Take 1 tablet (100 mg total) by mouth daily., Disp: 20 tablet, Rfl: 0 .  gabapentin (NEURONTIN) 100 MG capsule, Take 2 capsules (200 mg total) by mouth 3 (three) times daily., Disp: 540 capsule, Rfl: 3 .  hydrALAZINE (APRESOLINE) 25 MG tablet, Take 1 tablet (25 mg total) by mouth 2 (two) times daily., Disp: 180 tablet, Rfl: 3 .  linaclotide (LINZESS) 290 MCG CAPS capsule, Take 1 capsule (290 mcg total) by mouth daily before breakfast., Disp: 90 capsule, Rfl: 3 .  loratadine (CLARITIN) 10 MG tablet, Take 10 mg by mouth as needed., Disp: , Rfl:  .  losartan-hydrochlorothiazide (HYZAAR) 100-25 MG tablet, Take 1 tablet by mouth daily., Disp: 90 tablet, Rfl: 3 .  Multiple Vitamin (MULTIVITAMIN WITH MINERALS) TABS tablet, Take 1 tablet by mouth daily. Centrum Silver, Disp: , Rfl:  .  Multiple Vitamins-Minerals (PRESERVISION AREDS 2) CAPS, Take 1 capsule by mouth 2 (two) times daily., Disp: , Rfl:  .  Propylene Glycol  (SYSTANE BALANCE OP), Place 1 drop into both eyes 3 (three) times daily as needed (dry eyes)., Disp: , Rfl:  .  rOPINIRole (REQUIP) 0.5 MG tablet, TAKE 1 TABLET BY MOUTH IN THE MORNING, 1 TABLET BY MOUTH AT LUNCH, AND 1 TABLET BY MOUTH IN THE EVENING., Disp: 270 tablet, Rfl: 3 .  traMADol (ULTRAM) 50 MG tablet, Take 1 tablet (  50 mg total) by mouth every 6 (six) hours as needed for moderate pain. (Patient taking differently: Take 50 mg by mouth daily as needed for moderate pain. ), Disp: 16 tablet, Rfl: 0  EXAM:  VITALS per patient if applicable:  GENERAL: alert, oriented, appears well and in no acute distress  PSYCH/NEURO: pleasant and cooperative, no obvious depression or anxiety, speech and thought processing grossly intact  ASSESSMENT AND PLAN:  Discussed the following assessment and plan:  Chronic cystitis F/u [facility]   Essential hypertension Cont meds  Monitor BP   Spinal stenosis, unspecified spinal region Osteoarthritis, unspecified osteoarthritis type, unspecified site Low back pain, unspecified back pain laterality, unspecified chronicity, unspecified whether sciatica present Lumbar DD Prn Tylenol Lidocaine patch  otc supplements I.e tumeric disc'ed   Memory loss Has home care services [facility] RN, PT/ot for now and cheryl getting someone to come in 2x per week   HM utd flu Tdap 2016 need to [place] records pna 23 03/03/17,  prevnar in future Had zostervax  disc shingrixtoday and given info  Colonoscopy ? 5 years agoout of age window nowf/u GI Dr. Darene Lamer S/p hysterectomy out of age window pap  Mammogramrepeat 08/26/17 normal DEXA 12/31/14 femoral neck +osteoporosis, total femoral density osteopenia, spine normal -ordered repeat DEXAnot done yet  Dermatologysaw 12/22/17 LN2 and bx left hand will f/u upcoming h/o BCC and left hand lesion c/w NMSC per pt and daughter Had appt sch 02/22/19 daughter will resch for 03/2019   -we discussed possible  serious and likely etiologies, options for evaluation and workup, limitations of telemedicine visit vs in person visit, treatment, treatment risks and precautions. Pt prefers to treat via telemedicine empirically rather then risking or undertaking an in person visit at this moment. Patient agrees to seek prompt in person care if worsening, new symptoms arise, or if is not improving with treatment.   I discussed the assessment and treatment plan with the patient. The patient was provided an opportunity to ask questions and all were answered. The patient agreed with the plan and demonstrated an understanding of the instructions.   The patient was advised to call back or seek an in-person evaluation if the symptoms worsen or if the condition fails to improve as anticipated.  Time spent 30 minutes  Delorise Jackson, MD

## 2019-04-06 ENCOUNTER — Encounter: Payer: Self-pay | Admitting: Internal Medicine

## 2019-04-11 ENCOUNTER — Telehealth: Payer: Self-pay | Admitting: Internal Medicine

## 2019-04-11 ENCOUNTER — Encounter: Payer: Self-pay | Admitting: Internal Medicine

## 2019-04-11 NOTE — Telephone Encounter (Signed)
Izora Gala with [facility] called to report fall 04/10/2019. Neck pain-wants order to apply heat . Please advise [phone]

## 2019-04-12 NOTE — Telephone Encounter (Signed)
Ok to apply heat let H/H know   Call daughter   I disc with pts daughter imaging CT cervical for neck pain post fall at ED or urgent care  -or does she want me to order this?

## 2019-04-12 NOTE — Telephone Encounter (Signed)
VM has been left to inform nancy of the below.

## 2019-04-12 NOTE — Telephone Encounter (Signed)
Kristine Jacobson from [facility] wanted to let Dr. Olivia Mackie that Pt has red scraped area under her right eye, sore ribs, other than that pt seems to be doing alright from her fall yesterday morning.

## 2019-04-13 DIAGNOSIS — M542 Cervicalgia: Secondary | ICD-10-CM | POA: Diagnosis not present

## 2019-04-13 DIAGNOSIS — S22069A Unspecified fracture of T7-T8 vertebra, initial encounter for closed fracture: Secondary | ICD-10-CM | POA: Diagnosis not present

## 2019-04-13 DIAGNOSIS — M47812 Spondylosis without myelopathy or radiculopathy, cervical region: Secondary | ICD-10-CM | POA: Diagnosis not present

## 2019-04-13 DIAGNOSIS — Z792 Long term (current) use of antibiotics: Secondary | ICD-10-CM | POA: Diagnosis not present

## 2019-04-13 DIAGNOSIS — R519 Headache, unspecified: Secondary | ICD-10-CM | POA: Diagnosis not present

## 2019-04-13 DIAGNOSIS — M47816 Spondylosis without myelopathy or radiculopathy, lumbar region: Secondary | ICD-10-CM | POA: Diagnosis not present

## 2019-04-13 DIAGNOSIS — Z79899 Other long term (current) drug therapy: Secondary | ICD-10-CM | POA: Diagnosis not present

## 2019-04-13 DIAGNOSIS — S0511XA Contusion of eyeball and orbital tissues, right eye, initial encounter: Secondary | ICD-10-CM | POA: Diagnosis not present

## 2019-04-13 DIAGNOSIS — Z7982 Long term (current) use of aspirin: Secondary | ICD-10-CM | POA: Diagnosis not present

## 2019-04-13 DIAGNOSIS — S0993XA Unspecified injury of face, initial encounter: Secondary | ICD-10-CM | POA: Diagnosis not present

## 2019-04-13 DIAGNOSIS — M47814 Spondylosis without myelopathy or radiculopathy, thoracic region: Secondary | ICD-10-CM | POA: Diagnosis not present

## 2019-04-13 DIAGNOSIS — R269 Unspecified abnormalities of gait and mobility: Secondary | ICD-10-CM | POA: Diagnosis not present

## 2019-04-13 DIAGNOSIS — S32009A Unspecified fracture of unspecified lumbar vertebra, initial encounter for closed fracture: Secondary | ICD-10-CM | POA: Diagnosis not present

## 2019-04-13 DIAGNOSIS — S0990XA Unspecified injury of head, initial encounter: Secondary | ICD-10-CM | POA: Diagnosis not present

## 2019-04-13 DIAGNOSIS — M546 Pain in thoracic spine: Secondary | ICD-10-CM | POA: Diagnosis not present

## 2019-04-13 DIAGNOSIS — N3941 Urge incontinence: Secondary | ICD-10-CM | POA: Diagnosis not present

## 2019-04-13 DIAGNOSIS — I10 Essential (primary) hypertension: Secondary | ICD-10-CM | POA: Diagnosis not present

## 2019-04-13 DIAGNOSIS — M545 Low back pain: Secondary | ICD-10-CM | POA: Diagnosis not present

## 2019-04-13 DIAGNOSIS — K5900 Constipation, unspecified: Secondary | ICD-10-CM | POA: Diagnosis not present

## 2019-04-13 DIAGNOSIS — Z20822 Contact with and (suspected) exposure to covid-19: Secondary | ICD-10-CM | POA: Diagnosis not present

## 2019-04-13 DIAGNOSIS — M25519 Pain in unspecified shoulder: Secondary | ICD-10-CM | POA: Diagnosis not present

## 2019-04-13 DIAGNOSIS — S299XXA Unspecified injury of thorax, initial encounter: Secondary | ICD-10-CM | POA: Diagnosis not present

## 2019-04-13 DIAGNOSIS — S32019A Unspecified fracture of first lumbar vertebra, initial encounter for closed fracture: Secondary | ICD-10-CM | POA: Diagnosis not present

## 2019-04-13 DIAGNOSIS — S199XXA Unspecified injury of neck, initial encounter: Secondary | ICD-10-CM | POA: Diagnosis not present

## 2019-04-13 DIAGNOSIS — R069 Unspecified abnormalities of breathing: Secondary | ICD-10-CM | POA: Diagnosis not present

## 2019-04-13 DIAGNOSIS — K219 Gastro-esophageal reflux disease without esophagitis: Secondary | ICD-10-CM | POA: Diagnosis not present

## 2019-04-13 DIAGNOSIS — G8929 Other chronic pain: Secondary | ICD-10-CM | POA: Diagnosis not present

## 2019-04-13 NOTE — Telephone Encounter (Signed)
They can clean daily soap and water and use vasoline or bacitracin  TMS

## 2019-04-13 NOTE — Telephone Encounter (Signed)
Patient is currently at [facility]. Just got admitted this afternoon. She fell and hurt her back. She does have a fracture in her back. Both daughters are coming to terms of having 24/7 home care or assisted living. Daughter will keep PCP up to date.

## 2019-04-14 DIAGNOSIS — M549 Dorsalgia, unspecified: Secondary | ICD-10-CM | POA: Diagnosis not present

## 2019-04-14 DIAGNOSIS — G8929 Other chronic pain: Secondary | ICD-10-CM | POA: Diagnosis not present

## 2019-04-14 DIAGNOSIS — I10 Essential (primary) hypertension: Secondary | ICD-10-CM | POA: Diagnosis not present

## 2019-04-15 DIAGNOSIS — K5909 Other constipation: Secondary | ICD-10-CM | POA: Diagnosis not present

## 2019-04-15 DIAGNOSIS — N3281 Overactive bladder: Secondary | ICD-10-CM | POA: Diagnosis not present

## 2019-04-15 DIAGNOSIS — M545 Low back pain: Secondary | ICD-10-CM | POA: Diagnosis not present

## 2019-04-15 DIAGNOSIS — G8929 Other chronic pain: Secondary | ICD-10-CM | POA: Diagnosis not present

## 2019-04-16 DIAGNOSIS — G8929 Other chronic pain: Secondary | ICD-10-CM | POA: Diagnosis not present

## 2019-04-16 DIAGNOSIS — M542 Cervicalgia: Secondary | ICD-10-CM | POA: Diagnosis not present

## 2019-04-16 DIAGNOSIS — M545 Low back pain: Secondary | ICD-10-CM | POA: Diagnosis not present

## 2019-04-16 DIAGNOSIS — I10 Essential (primary) hypertension: Secondary | ICD-10-CM | POA: Diagnosis not present

## 2019-04-17 DIAGNOSIS — N3281 Overactive bladder: Secondary | ICD-10-CM | POA: Diagnosis not present

## 2019-04-17 DIAGNOSIS — M545 Low back pain: Secondary | ICD-10-CM | POA: Diagnosis not present

## 2019-04-17 DIAGNOSIS — I10 Essential (primary) hypertension: Secondary | ICD-10-CM | POA: Diagnosis not present

## 2019-04-17 DIAGNOSIS — G8929 Other chronic pain: Secondary | ICD-10-CM | POA: Diagnosis not present

## 2019-04-17 MED ORDER — SENNOSIDES 8.6 MG PO TABS
2.00 | ORAL_TABLET | ORAL | Status: DC
Start: 2019-04-21 — End: 2019-04-17

## 2019-04-17 MED ORDER — ENOXAPARIN SODIUM 40 MG/0.4ML ~~LOC~~ SOLN
40.00 | SUBCUTANEOUS | Status: DC
Start: 2019-04-21 — End: 2019-04-17

## 2019-04-17 MED ORDER — BISACODYL 5 MG PO TBEC
5.00 | DELAYED_RELEASE_TABLET | ORAL | Status: DC
Start: ? — End: 2019-04-17

## 2019-04-17 MED ORDER — GABAPENTIN 100 MG PO CAPS
200.00 | ORAL_CAPSULE | ORAL | Status: DC
Start: 2019-04-21 — End: 2019-04-17

## 2019-04-17 MED ORDER — ACETAMINOPHEN 500 MG PO TABS
1000.00 | ORAL_TABLET | ORAL | Status: DC
Start: 2019-04-21 — End: 2019-04-17

## 2019-04-17 MED ORDER — CARVEDILOL 3.125 MG PO TABS
3.13 | ORAL_TABLET | ORAL | Status: DC
Start: 2019-04-17 — End: 2019-04-17

## 2019-04-17 MED ORDER — GENERIC EXTERNAL MEDICATION
Status: DC
Start: 2019-04-22 — End: 2019-04-17

## 2019-04-17 MED ORDER — OXYCODONE HCL 5 MG PO TABS
2.50 | ORAL_TABLET | ORAL | Status: DC
Start: 2019-04-17 — End: 2019-04-17

## 2019-04-17 MED ORDER — LIDOCAINE 5 % EX PTCH
1.00 | MEDICATED_PATCH | CUTANEOUS | Status: DC
Start: 2019-04-22 — End: 2019-04-17

## 2019-04-17 MED ORDER — POLYETHYLENE GLYCOL 3350 17 GM/SCOOP PO POWD
17.00 | ORAL | Status: DC
Start: 2019-04-21 — End: 2019-04-17

## 2019-04-17 MED ORDER — AMLODIPINE BESYLATE 5 MG PO TABS
7.50 | ORAL_TABLET | ORAL | Status: DC
Start: 2019-04-22 — End: 2019-04-17

## 2019-04-18 DIAGNOSIS — N3281 Overactive bladder: Secondary | ICD-10-CM | POA: Diagnosis not present

## 2019-04-18 DIAGNOSIS — M545 Low back pain: Secondary | ICD-10-CM | POA: Diagnosis not present

## 2019-04-18 DIAGNOSIS — G8929 Other chronic pain: Secondary | ICD-10-CM | POA: Diagnosis not present

## 2019-04-18 DIAGNOSIS — I10 Essential (primary) hypertension: Secondary | ICD-10-CM | POA: Diagnosis not present

## 2019-04-19 ENCOUNTER — Other Ambulatory Visit: Payer: Medicare Other

## 2019-04-19 ENCOUNTER — Ambulatory Visit: Payer: Medicare Other

## 2019-04-19 DIAGNOSIS — I10 Essential (primary) hypertension: Secondary | ICD-10-CM | POA: Diagnosis not present

## 2019-04-19 DIAGNOSIS — M545 Low back pain: Secondary | ICD-10-CM | POA: Diagnosis not present

## 2019-04-19 DIAGNOSIS — G8929 Other chronic pain: Secondary | ICD-10-CM | POA: Diagnosis not present

## 2019-04-19 DIAGNOSIS — N3281 Overactive bladder: Secondary | ICD-10-CM | POA: Diagnosis not present

## 2019-04-20 DIAGNOSIS — I10 Essential (primary) hypertension: Secondary | ICD-10-CM | POA: Diagnosis not present

## 2019-04-20 DIAGNOSIS — G8929 Other chronic pain: Secondary | ICD-10-CM | POA: Diagnosis not present

## 2019-04-20 DIAGNOSIS — M545 Low back pain: Secondary | ICD-10-CM | POA: Diagnosis not present

## 2019-04-20 DIAGNOSIS — N3281 Overactive bladder: Secondary | ICD-10-CM | POA: Diagnosis not present

## 2019-04-21 DIAGNOSIS — R109 Unspecified abdominal pain: Secondary | ICD-10-CM | POA: Diagnosis not present

## 2019-04-21 DIAGNOSIS — K5900 Constipation, unspecified: Secondary | ICD-10-CM | POA: Diagnosis not present

## 2019-04-21 DIAGNOSIS — R52 Pain, unspecified: Secondary | ICD-10-CM | POA: Diagnosis not present

## 2019-04-21 DIAGNOSIS — I10 Essential (primary) hypertension: Secondary | ICD-10-CM | POA: Diagnosis not present

## 2019-04-21 DIAGNOSIS — N3281 Overactive bladder: Secondary | ICD-10-CM | POA: Diagnosis not present

## 2019-04-21 DIAGNOSIS — R279 Unspecified lack of coordination: Secondary | ICD-10-CM | POA: Diagnosis not present

## 2019-04-21 DIAGNOSIS — M549 Dorsalgia, unspecified: Secondary | ICD-10-CM | POA: Diagnosis not present

## 2019-04-21 DIAGNOSIS — R54 Age-related physical debility: Secondary | ICD-10-CM | POA: Diagnosis not present

## 2019-04-21 DIAGNOSIS — N3020 Other chronic cystitis without hematuria: Secondary | ICD-10-CM | POA: Diagnosis not present

## 2019-04-21 DIAGNOSIS — R269 Unspecified abnormalities of gait and mobility: Secondary | ICD-10-CM | POA: Diagnosis not present

## 2019-04-21 DIAGNOSIS — G2581 Restless legs syndrome: Secondary | ICD-10-CM | POA: Diagnosis not present

## 2019-04-21 DIAGNOSIS — N3941 Urge incontinence: Secondary | ICD-10-CM | POA: Diagnosis not present

## 2019-04-21 DIAGNOSIS — R5381 Other malaise: Secondary | ICD-10-CM | POA: Diagnosis not present

## 2019-04-21 DIAGNOSIS — M810 Age-related osteoporosis without current pathological fracture: Secondary | ICD-10-CM | POA: Diagnosis not present

## 2019-04-21 DIAGNOSIS — M545 Low back pain: Secondary | ICD-10-CM | POA: Diagnosis not present

## 2019-04-21 DIAGNOSIS — K219 Gastro-esophageal reflux disease without esophagitis: Secondary | ICD-10-CM | POA: Diagnosis not present

## 2019-04-21 DIAGNOSIS — I700 Atherosclerosis of aorta: Secondary | ICD-10-CM | POA: Diagnosis not present

## 2019-04-21 DIAGNOSIS — M6281 Muscle weakness (generalized): Secondary | ICD-10-CM | POA: Diagnosis not present

## 2019-04-21 DIAGNOSIS — G8929 Other chronic pain: Secondary | ICD-10-CM | POA: Diagnosis not present

## 2019-04-21 MED ORDER — DICLOFENAC SODIUM 1 % EX GEL
2.00 | CUTANEOUS | Status: DC
Start: ? — End: 2019-04-21

## 2019-04-21 MED ORDER — OXYCODONE HCL 5 MG PO TABS
2.50 | ORAL_TABLET | ORAL | Status: DC
Start: ? — End: 2019-04-21

## 2019-04-21 MED ORDER — CARVEDILOL 3.125 MG PO TABS
3.13 | ORAL_TABLET | ORAL | Status: DC
Start: 2019-04-21 — End: 2019-04-21

## 2019-04-21 MED ORDER — CALCIUM CARBONATE ANTACID 500 MG PO CHEW
CHEWABLE_TABLET | ORAL | Status: DC
Start: ? — End: 2019-04-21

## 2019-04-24 ENCOUNTER — Encounter: Payer: Self-pay | Admitting: Internal Medicine

## 2019-05-05 DIAGNOSIS — M810 Age-related osteoporosis without current pathological fracture: Secondary | ICD-10-CM | POA: Diagnosis not present

## 2019-05-05 DIAGNOSIS — I700 Atherosclerosis of aorta: Secondary | ICD-10-CM | POA: Diagnosis not present

## 2019-05-05 DIAGNOSIS — R269 Unspecified abnormalities of gait and mobility: Secondary | ICD-10-CM | POA: Diagnosis not present

## 2019-05-05 DIAGNOSIS — M549 Dorsalgia, unspecified: Secondary | ICD-10-CM | POA: Diagnosis not present

## 2019-05-08 DIAGNOSIS — Z1152 Encounter for screening for COVID-19: Secondary | ICD-10-CM | POA: Diagnosis not present

## 2019-05-09 DIAGNOSIS — I10 Essential (primary) hypertension: Secondary | ICD-10-CM | POA: Diagnosis not present

## 2019-05-09 DIAGNOSIS — R32 Unspecified urinary incontinence: Secondary | ICD-10-CM | POA: Diagnosis not present

## 2019-05-09 DIAGNOSIS — K5900 Constipation, unspecified: Secondary | ICD-10-CM | POA: Diagnosis not present

## 2019-05-09 DIAGNOSIS — G2581 Restless legs syndrome: Secondary | ICD-10-CM | POA: Diagnosis not present

## 2019-05-11 DIAGNOSIS — F4320 Adjustment disorder, unspecified: Secondary | ICD-10-CM | POA: Diagnosis not present

## 2019-05-12 DIAGNOSIS — Z20828 Contact with and (suspected) exposure to other viral communicable diseases: Secondary | ICD-10-CM | POA: Diagnosis not present

## 2019-05-12 DIAGNOSIS — U071 COVID-19: Secondary | ICD-10-CM | POA: Diagnosis not present

## 2019-05-17 DIAGNOSIS — F4320 Adjustment disorder, unspecified: Secondary | ICD-10-CM | POA: Diagnosis not present

## 2019-05-19 DIAGNOSIS — U071 COVID-19: Secondary | ICD-10-CM | POA: Diagnosis not present

## 2019-05-19 DIAGNOSIS — Z20828 Contact with and (suspected) exposure to other viral communicable diseases: Secondary | ICD-10-CM | POA: Diagnosis not present

## 2019-05-24 ENCOUNTER — Ambulatory Visit: Payer: Medicare Other | Admitting: Internal Medicine

## 2019-05-26 DIAGNOSIS — U071 COVID-19: Secondary | ICD-10-CM | POA: Diagnosis not present

## 2019-05-26 DIAGNOSIS — F4322 Adjustment disorder with anxiety: Secondary | ICD-10-CM | POA: Diagnosis not present

## 2019-05-26 DIAGNOSIS — Z20828 Contact with and (suspected) exposure to other viral communicable diseases: Secondary | ICD-10-CM | POA: Diagnosis not present

## 2019-05-30 DIAGNOSIS — F4320 Adjustment disorder, unspecified: Secondary | ICD-10-CM | POA: Diagnosis not present

## 2019-05-30 DIAGNOSIS — F4322 Adjustment disorder with anxiety: Secondary | ICD-10-CM | POA: Diagnosis not present

## 2019-06-02 DIAGNOSIS — U071 COVID-19: Secondary | ICD-10-CM | POA: Diagnosis not present

## 2019-06-02 DIAGNOSIS — Z20828 Contact with and (suspected) exposure to other viral communicable diseases: Secondary | ICD-10-CM | POA: Diagnosis not present

## 2019-06-27 DIAGNOSIS — G2581 Restless legs syndrome: Secondary | ICD-10-CM | POA: Diagnosis not present

## 2019-06-27 DIAGNOSIS — K219 Gastro-esophageal reflux disease without esophagitis: Secondary | ICD-10-CM | POA: Diagnosis not present

## 2019-06-27 DIAGNOSIS — K5900 Constipation, unspecified: Secondary | ICD-10-CM | POA: Diagnosis not present

## 2019-06-27 DIAGNOSIS — I10 Essential (primary) hypertension: Secondary | ICD-10-CM | POA: Diagnosis not present

## 2019-06-28 DIAGNOSIS — M25512 Pain in left shoulder: Secondary | ICD-10-CM | POA: Diagnosis not present

## 2019-07-19 DIAGNOSIS — F4322 Adjustment disorder with anxiety: Secondary | ICD-10-CM | POA: Diagnosis not present

## 2019-07-20 DIAGNOSIS — L84 Corns and callosities: Secondary | ICD-10-CM | POA: Diagnosis not present

## 2019-07-20 DIAGNOSIS — B351 Tinea unguium: Secondary | ICD-10-CM | POA: Diagnosis not present

## 2019-07-21 DIAGNOSIS — F4322 Adjustment disorder with anxiety: Secondary | ICD-10-CM | POA: Diagnosis not present

## 2019-07-25 DIAGNOSIS — F4322 Adjustment disorder with anxiety: Secondary | ICD-10-CM | POA: Diagnosis not present

## 2019-07-31 DIAGNOSIS — K219 Gastro-esophageal reflux disease without esophagitis: Secondary | ICD-10-CM | POA: Diagnosis not present

## 2019-08-06 ENCOUNTER — Encounter: Payer: Self-pay | Admitting: Internal Medicine

## 2019-08-08 ENCOUNTER — Other Ambulatory Visit: Payer: Self-pay

## 2019-08-08 DIAGNOSIS — F4322 Adjustment disorder with anxiety: Secondary | ICD-10-CM | POA: Diagnosis not present

## 2019-08-08 MED ORDER — LUBIPROSTONE 8 MCG PO CAPS: 8.0000 ug | ORAL_CAPSULE | Freq: Two times a day (BID) | ORAL | 0 refills | Status: AC

## 2019-08-08 NOTE — Telephone Encounter (Signed)
Whitlow,Cheryl (Daughter)  [phone] (Mobile)  Called Patient's daughter and informed that these were both ready to pick up. She will try and get these this afternoon.

## 2019-08-09 ENCOUNTER — Telehealth: Payer: Self-pay

## 2019-08-09 NOTE — Telephone Encounter (Signed)
Kristine Jacobson with [employer] called stating the [facility] [place] has been denied because it was not an FDA approved medication for pt's condition. If you have any questions, you can contact [place] at [phone].

## 2019-08-10 DIAGNOSIS — D518 Other vitamin B12 deficiency anemias: Secondary | ICD-10-CM | POA: Diagnosis not present

## 2019-08-10 DIAGNOSIS — E119 Type 2 diabetes mellitus without complications: Secondary | ICD-10-CM | POA: Diagnosis not present

## 2019-08-10 DIAGNOSIS — E7849 Other hyperlipidemia: Secondary | ICD-10-CM | POA: Diagnosis not present

## 2019-08-10 DIAGNOSIS — Z79899 Other long term (current) drug therapy: Secondary | ICD-10-CM | POA: Diagnosis not present

## 2019-08-11 DIAGNOSIS — N390 Urinary tract infection, site not specified: Secondary | ICD-10-CM | POA: Diagnosis not present

## 2019-08-12 DIAGNOSIS — R350 Frequency of micturition: Secondary | ICD-10-CM | POA: Diagnosis not present

## 2019-08-12 DIAGNOSIS — E871 Hypo-osmolality and hyponatremia: Secondary | ICD-10-CM | POA: Diagnosis not present

## 2019-08-12 DIAGNOSIS — F0390 Unspecified dementia without behavioral disturbance: Secondary | ICD-10-CM | POA: Diagnosis not present

## 2019-08-12 DIAGNOSIS — N390 Urinary tract infection, site not specified: Secondary | ICD-10-CM | POA: Diagnosis not present

## 2019-08-12 DIAGNOSIS — R3129 Other microscopic hematuria: Secondary | ICD-10-CM | POA: Diagnosis not present

## 2019-08-12 DIAGNOSIS — R300 Dysuria: Secondary | ICD-10-CM | POA: Diagnosis not present

## 2019-08-12 DIAGNOSIS — I10 Essential (primary) hypertension: Secondary | ICD-10-CM | POA: Diagnosis not present

## 2019-08-12 DIAGNOSIS — R3982 Chronic bladder pain: Secondary | ICD-10-CM | POA: Diagnosis not present

## 2019-08-12 DIAGNOSIS — Z20822 Contact with and (suspected) exposure to covid-19: Secondary | ICD-10-CM | POA: Diagnosis not present

## 2019-08-12 DIAGNOSIS — G8929 Other chronic pain: Secondary | ICD-10-CM | POA: Diagnosis not present

## 2019-08-12 DIAGNOSIS — Z885 Allergy status to narcotic agent status: Secondary | ICD-10-CM | POA: Diagnosis not present

## 2019-08-12 DIAGNOSIS — R400 Somnolence: Secondary | ICD-10-CM | POA: Diagnosis not present

## 2019-08-12 DIAGNOSIS — M79604 Pain in right leg: Secondary | ICD-10-CM | POA: Diagnosis not present

## 2019-08-12 DIAGNOSIS — M810 Age-related osteoporosis without current pathological fracture: Secondary | ICD-10-CM | POA: Diagnosis not present

## 2019-08-12 DIAGNOSIS — I7090 Unspecified atherosclerosis: Secondary | ICD-10-CM | POA: Diagnosis not present

## 2019-08-12 DIAGNOSIS — N3000 Acute cystitis without hematuria: Secondary | ICD-10-CM | POA: Diagnosis not present

## 2019-08-12 DIAGNOSIS — R339 Retention of urine, unspecified: Secondary | ICD-10-CM | POA: Diagnosis not present

## 2019-08-12 DIAGNOSIS — N3941 Urge incontinence: Secondary | ICD-10-CM | POA: Diagnosis not present

## 2019-08-12 DIAGNOSIS — Z87891 Personal history of nicotine dependence: Secondary | ICD-10-CM | POA: Diagnosis not present

## 2019-08-12 DIAGNOSIS — M545 Low back pain: Secondary | ICD-10-CM | POA: Diagnosis not present

## 2019-08-12 DIAGNOSIS — M79605 Pain in left leg: Secondary | ICD-10-CM | POA: Diagnosis not present

## 2019-08-13 DIAGNOSIS — N3000 Acute cystitis without hematuria: Secondary | ICD-10-CM | POA: Diagnosis not present

## 2019-08-13 DIAGNOSIS — I10 Essential (primary) hypertension: Secondary | ICD-10-CM | POA: Diagnosis not present

## 2019-08-13 DIAGNOSIS — M545 Low back pain: Secondary | ICD-10-CM | POA: Diagnosis not present

## 2019-08-13 DIAGNOSIS — G8929 Other chronic pain: Secondary | ICD-10-CM | POA: Diagnosis not present

## 2019-08-14 DIAGNOSIS — N3000 Acute cystitis without hematuria: Secondary | ICD-10-CM | POA: Diagnosis not present

## 2019-08-14 DIAGNOSIS — I10 Essential (primary) hypertension: Secondary | ICD-10-CM | POA: Diagnosis not present

## 2019-08-14 DIAGNOSIS — M79662 Pain in left lower leg: Secondary | ICD-10-CM | POA: Diagnosis not present

## 2019-08-14 DIAGNOSIS — M79661 Pain in right lower leg: Secondary | ICD-10-CM | POA: Diagnosis not present

## 2019-08-15 DIAGNOSIS — M79662 Pain in left lower leg: Secondary | ICD-10-CM | POA: Diagnosis not present

## 2019-08-15 DIAGNOSIS — M79661 Pain in right lower leg: Secondary | ICD-10-CM | POA: Diagnosis not present

## 2019-08-15 DIAGNOSIS — N3000 Acute cystitis without hematuria: Secondary | ICD-10-CM | POA: Diagnosis not present

## 2019-08-15 DIAGNOSIS — B9620 Unspecified Escherichia coli [E. coli] as the cause of diseases classified elsewhere: Secondary | ICD-10-CM | POA: Diagnosis not present

## 2019-08-16 DIAGNOSIS — M79662 Pain in left lower leg: Secondary | ICD-10-CM | POA: Diagnosis not present

## 2019-08-16 DIAGNOSIS — B9620 Unspecified Escherichia coli [E. coli] as the cause of diseases classified elsewhere: Secondary | ICD-10-CM | POA: Diagnosis not present

## 2019-08-16 DIAGNOSIS — N3000 Acute cystitis without hematuria: Secondary | ICD-10-CM | POA: Diagnosis not present

## 2019-08-16 DIAGNOSIS — M79604 Pain in right leg: Secondary | ICD-10-CM | POA: Diagnosis not present

## 2019-08-16 DIAGNOSIS — M79605 Pain in left leg: Secondary | ICD-10-CM | POA: Diagnosis not present

## 2019-08-16 DIAGNOSIS — M79661 Pain in right lower leg: Secondary | ICD-10-CM | POA: Diagnosis not present

## 2019-08-16 MED ORDER — GABAPENTIN 300 MG PO CAPS
300.00 | ORAL_CAPSULE | ORAL | Status: DC
Start: 2019-08-22 — End: 2019-08-16

## 2019-08-16 MED ORDER — ENOXAPARIN SODIUM 40 MG/0.4ML ~~LOC~~ SOLN
40.00 | SUBCUTANEOUS | Status: DC
Start: 2019-08-22 — End: 2019-08-16

## 2019-08-16 MED ORDER — GABAPENTIN 100 MG PO CAPS
200.00 | ORAL_CAPSULE | ORAL | Status: DC
Start: 2019-08-16 — End: 2019-08-16

## 2019-08-16 MED ORDER — ACETAMINOPHEN 325 MG PO TABS
650.00 | ORAL_TABLET | ORAL | Status: DC
Start: ? — End: 2019-08-16

## 2019-08-16 MED ORDER — AMLODIPINE BESYLATE 5 MG PO TABS
7.50 | ORAL_TABLET | ORAL | Status: DC
Start: 2019-08-23 — End: 2019-08-16

## 2019-08-16 MED ORDER — CARVEDILOL 3.125 MG PO TABS
3.13 | ORAL_TABLET | ORAL | Status: DC
Start: 2019-08-22 — End: 2019-08-16

## 2019-08-16 MED ORDER — GENERIC EXTERNAL MEDICATION
Status: DC
Start: 2019-08-23 — End: 2019-08-16

## 2019-08-16 MED ORDER — DICLOFENAC SODIUM 1 % EX GEL
2.00 | CUTANEOUS | Status: DC
Start: 2019-08-22 — End: 2019-08-16

## 2019-08-16 MED ORDER — LIDOCAINE 5 % EX PTCH
1.00 | MEDICATED_PATCH | CUTANEOUS | Status: DC
Start: 2019-08-23 — End: 2019-08-16

## 2019-08-16 MED ORDER — CEPHALEXIN 250 MG PO CAPS
250.00 | ORAL_CAPSULE | ORAL | Status: DC
Start: 2019-08-16 — End: 2019-08-16

## 2019-08-17 DIAGNOSIS — M79661 Pain in right lower leg: Secondary | ICD-10-CM | POA: Diagnosis not present

## 2019-08-17 DIAGNOSIS — I10 Essential (primary) hypertension: Secondary | ICD-10-CM | POA: Diagnosis not present

## 2019-08-17 DIAGNOSIS — Z79899 Other long term (current) drug therapy: Secondary | ICD-10-CM | POA: Diagnosis not present

## 2019-08-17 DIAGNOSIS — E119 Type 2 diabetes mellitus without complications: Secondary | ICD-10-CM | POA: Diagnosis not present

## 2019-08-17 DIAGNOSIS — M79662 Pain in left lower leg: Secondary | ICD-10-CM | POA: Diagnosis not present

## 2019-08-17 DIAGNOSIS — E7849 Other hyperlipidemia: Secondary | ICD-10-CM | POA: Diagnosis not present

## 2019-08-17 DIAGNOSIS — M545 Low back pain: Secondary | ICD-10-CM | POA: Diagnosis not present

## 2019-08-17 DIAGNOSIS — D518 Other vitamin B12 deficiency anemias: Secondary | ICD-10-CM | POA: Diagnosis not present

## 2019-08-18 DIAGNOSIS — M79662 Pain in left lower leg: Secondary | ICD-10-CM | POA: Diagnosis not present

## 2019-08-18 DIAGNOSIS — I10 Essential (primary) hypertension: Secondary | ICD-10-CM | POA: Diagnosis not present

## 2019-08-18 DIAGNOSIS — M545 Low back pain: Secondary | ICD-10-CM | POA: Diagnosis not present

## 2019-08-18 DIAGNOSIS — M79661 Pain in right lower leg: Secondary | ICD-10-CM | POA: Diagnosis not present

## 2019-08-19 DIAGNOSIS — M79661 Pain in right lower leg: Secondary | ICD-10-CM | POA: Diagnosis not present

## 2019-08-19 DIAGNOSIS — I10 Essential (primary) hypertension: Secondary | ICD-10-CM | POA: Diagnosis not present

## 2019-08-19 DIAGNOSIS — M545 Low back pain: Secondary | ICD-10-CM | POA: Diagnosis not present

## 2019-08-19 DIAGNOSIS — M79662 Pain in left lower leg: Secondary | ICD-10-CM | POA: Diagnosis not present

## 2019-08-20 DIAGNOSIS — M79662 Pain in left lower leg: Secondary | ICD-10-CM | POA: Diagnosis not present

## 2019-08-20 DIAGNOSIS — I10 Essential (primary) hypertension: Secondary | ICD-10-CM | POA: Diagnosis not present

## 2019-08-20 DIAGNOSIS — M545 Low back pain: Secondary | ICD-10-CM | POA: Diagnosis not present

## 2019-08-20 DIAGNOSIS — M79661 Pain in right lower leg: Secondary | ICD-10-CM | POA: Diagnosis not present

## 2019-08-21 DIAGNOSIS — M545 Low back pain: Secondary | ICD-10-CM | POA: Diagnosis not present

## 2019-08-21 DIAGNOSIS — M79661 Pain in right lower leg: Secondary | ICD-10-CM | POA: Diagnosis not present

## 2019-08-21 DIAGNOSIS — M79662 Pain in left lower leg: Secondary | ICD-10-CM | POA: Diagnosis not present

## 2019-08-21 DIAGNOSIS — I10 Essential (primary) hypertension: Secondary | ICD-10-CM | POA: Diagnosis not present

## 2019-08-22 DIAGNOSIS — Z885 Allergy status to narcotic agent status: Secondary | ICD-10-CM | POA: Diagnosis not present

## 2019-08-22 DIAGNOSIS — B9620 Unspecified Escherichia coli [E. coli] as the cause of diseases classified elsewhere: Secondary | ICD-10-CM | POA: Diagnosis not present

## 2019-08-22 DIAGNOSIS — M6250 Muscle wasting and atrophy, not elsewhere classified, unspecified site: Secondary | ICD-10-CM | POA: Diagnosis not present

## 2019-08-22 DIAGNOSIS — M545 Low back pain: Secondary | ICD-10-CM | POA: Diagnosis not present

## 2019-08-22 DIAGNOSIS — I10 Essential (primary) hypertension: Secondary | ICD-10-CM | POA: Diagnosis not present

## 2019-08-22 DIAGNOSIS — H3530 Unspecified macular degeneration: Secondary | ICD-10-CM | POA: Diagnosis not present

## 2019-08-22 DIAGNOSIS — E039 Hypothyroidism, unspecified: Secondary | ICD-10-CM | POA: Diagnosis not present

## 2019-08-22 DIAGNOSIS — N3000 Acute cystitis without hematuria: Secondary | ICD-10-CM | POA: Diagnosis not present

## 2019-08-22 DIAGNOSIS — R400 Somnolence: Secondary | ICD-10-CM | POA: Diagnosis not present

## 2019-08-22 DIAGNOSIS — Z85828 Personal history of other malignant neoplasm of skin: Secondary | ICD-10-CM | POA: Diagnosis not present

## 2019-08-22 DIAGNOSIS — N3941 Urge incontinence: Secondary | ICD-10-CM | POA: Diagnosis not present

## 2019-08-22 DIAGNOSIS — M79661 Pain in right lower leg: Secondary | ICD-10-CM | POA: Diagnosis not present

## 2019-08-22 DIAGNOSIS — G2581 Restless legs syndrome: Secondary | ICD-10-CM | POA: Diagnosis not present

## 2019-08-22 DIAGNOSIS — M810 Age-related osteoporosis without current pathological fracture: Secondary | ICD-10-CM | POA: Diagnosis not present

## 2019-08-22 DIAGNOSIS — G8929 Other chronic pain: Secondary | ICD-10-CM | POA: Diagnosis not present

## 2019-08-22 DIAGNOSIS — R3982 Chronic bladder pain: Secondary | ICD-10-CM | POA: Diagnosis not present

## 2019-08-22 DIAGNOSIS — F0390 Unspecified dementia without behavioral disturbance: Secondary | ICD-10-CM | POA: Diagnosis not present

## 2019-08-22 DIAGNOSIS — N390 Urinary tract infection, site not specified: Secondary | ICD-10-CM | POA: Diagnosis not present

## 2019-08-22 DIAGNOSIS — K589 Irritable bowel syndrome without diarrhea: Secondary | ICD-10-CM | POA: Diagnosis not present

## 2019-08-22 DIAGNOSIS — M5136 Other intervertebral disc degeneration, lumbar region: Secondary | ICD-10-CM | POA: Diagnosis not present

## 2019-08-22 DIAGNOSIS — R3129 Other microscopic hematuria: Secondary | ICD-10-CM | POA: Diagnosis not present

## 2019-08-22 DIAGNOSIS — K219 Gastro-esophageal reflux disease without esophagitis: Secondary | ICD-10-CM | POA: Diagnosis not present

## 2019-08-22 DIAGNOSIS — R531 Weakness: Secondary | ICD-10-CM | POA: Diagnosis not present

## 2019-08-22 DIAGNOSIS — Z87891 Personal history of nicotine dependence: Secondary | ICD-10-CM | POA: Diagnosis not present

## 2019-08-22 DIAGNOSIS — R350 Frequency of micturition: Secondary | ICD-10-CM | POA: Diagnosis not present

## 2019-08-22 DIAGNOSIS — M79662 Pain in left lower leg: Secondary | ICD-10-CM | POA: Diagnosis not present

## 2019-08-22 DIAGNOSIS — M79605 Pain in left leg: Secondary | ICD-10-CM | POA: Diagnosis not present

## 2019-08-22 DIAGNOSIS — I7090 Unspecified atherosclerosis: Secondary | ICD-10-CM | POA: Diagnosis not present

## 2019-08-22 DIAGNOSIS — M79604 Pain in right leg: Secondary | ICD-10-CM | POA: Diagnosis not present

## 2019-08-22 DIAGNOSIS — N3090 Cystitis, unspecified without hematuria: Secondary | ICD-10-CM | POA: Diagnosis not present

## 2019-08-22 DIAGNOSIS — R339 Retention of urine, unspecified: Secondary | ICD-10-CM | POA: Diagnosis not present

## 2019-08-22 DIAGNOSIS — M48062 Spinal stenosis, lumbar region with neurogenic claudication: Secondary | ICD-10-CM | POA: Diagnosis not present

## 2019-08-22 DIAGNOSIS — D649 Anemia, unspecified: Secondary | ICD-10-CM | POA: Diagnosis not present

## 2019-08-22 DIAGNOSIS — I70209 Unspecified atherosclerosis of native arteries of extremities, unspecified extremity: Secondary | ICD-10-CM | POA: Diagnosis not present

## 2019-08-22 MED ORDER — ACETAMINOPHEN 500 MG PO TABS
1000.00 | ORAL_TABLET | ORAL | Status: DC
Start: 2019-08-22 — End: 2019-08-22

## 2019-08-23 DIAGNOSIS — I10 Essential (primary) hypertension: Secondary | ICD-10-CM | POA: Diagnosis not present

## 2019-08-23 DIAGNOSIS — M48062 Spinal stenosis, lumbar region with neurogenic claudication: Secondary | ICD-10-CM | POA: Diagnosis not present

## 2019-08-23 DIAGNOSIS — N3090 Cystitis, unspecified without hematuria: Secondary | ICD-10-CM | POA: Diagnosis not present

## 2019-08-26 ENCOUNTER — Encounter: Payer: Self-pay | Admitting: Internal Medicine

## 2019-08-30 ENCOUNTER — Ambulatory Visit: Payer: Medicare Other | Admitting: Internal Medicine

## 2019-08-30 NOTE — Telephone Encounter (Signed)
Informed Patient's daughter that list has been printed and she will come to pick this up today around lunch time.

## 2019-09-05 DIAGNOSIS — F4322 Adjustment disorder with anxiety: Secondary | ICD-10-CM | POA: Diagnosis not present

## 2019-09-08 DIAGNOSIS — K589 Irritable bowel syndrome without diarrhea: Secondary | ICD-10-CM | POA: Diagnosis not present

## 2019-09-08 DIAGNOSIS — H9193 Unspecified hearing loss, bilateral: Secondary | ICD-10-CM | POA: Diagnosis not present

## 2019-09-08 DIAGNOSIS — K219 Gastro-esophageal reflux disease without esophagitis: Secondary | ICD-10-CM | POA: Diagnosis not present

## 2019-09-08 DIAGNOSIS — R32 Unspecified urinary incontinence: Secondary | ICD-10-CM | POA: Diagnosis not present

## 2019-09-08 DIAGNOSIS — G2581 Restless legs syndrome: Secondary | ICD-10-CM | POA: Diagnosis not present

## 2019-09-08 DIAGNOSIS — M48062 Spinal stenosis, lumbar region with neurogenic claudication: Secondary | ICD-10-CM | POA: Diagnosis not present

## 2019-09-08 DIAGNOSIS — I10 Essential (primary) hypertension: Secondary | ICD-10-CM | POA: Diagnosis not present

## 2019-09-08 DIAGNOSIS — G8929 Other chronic pain: Secondary | ICD-10-CM | POA: Diagnosis not present

## 2019-09-08 DIAGNOSIS — Z9181 History of falling: Secondary | ICD-10-CM | POA: Diagnosis not present

## 2019-09-08 DIAGNOSIS — Z87440 Personal history of urinary (tract) infections: Secondary | ICD-10-CM | POA: Diagnosis not present

## 2019-09-14 ENCOUNTER — Telehealth: Payer: Self-pay | Admitting: Internal Medicine

## 2019-09-14 NOTE — Telephone Encounter (Signed)
Faxed certification and plan of care for 06-25-201-to 11-06-2019 to [place] on 09-14-19

## 2019-09-15 DIAGNOSIS — F0390 Unspecified dementia without behavioral disturbance: Secondary | ICD-10-CM | POA: Diagnosis not present

## 2019-09-15 DIAGNOSIS — F4322 Adjustment disorder with anxiety: Secondary | ICD-10-CM | POA: Diagnosis not present

## 2019-09-19 DIAGNOSIS — F4322 Adjustment disorder with anxiety: Secondary | ICD-10-CM | POA: Diagnosis not present

## 2019-09-19 DIAGNOSIS — F4320 Adjustment disorder, unspecified: Secondary | ICD-10-CM | POA: Diagnosis not present

## 2019-09-21 DIAGNOSIS — I10 Essential (primary) hypertension: Secondary | ICD-10-CM | POA: Diagnosis not present

## 2019-09-21 DIAGNOSIS — K5900 Constipation, unspecified: Secondary | ICD-10-CM | POA: Diagnosis not present

## 2019-09-21 DIAGNOSIS — G2581 Restless legs syndrome: Secondary | ICD-10-CM | POA: Diagnosis not present

## 2019-09-21 DIAGNOSIS — H3321 Serous retinal detachment, right eye: Secondary | ICD-10-CM | POA: Diagnosis not present

## 2019-09-21 DIAGNOSIS — K219 Gastro-esophageal reflux disease without esophagitis: Secondary | ICD-10-CM | POA: Diagnosis not present

## 2019-09-22 DIAGNOSIS — H353113 Nonexudative age-related macular degeneration, right eye, advanced atrophic without subfoveal involvement: Secondary | ICD-10-CM | POA: Diagnosis not present

## 2019-09-22 DIAGNOSIS — H4311 Vitreous hemorrhage, right eye: Secondary | ICD-10-CM | POA: Diagnosis not present

## 2019-09-22 DIAGNOSIS — H353222 Exudative age-related macular degeneration, left eye, with inactive choroidal neovascularization: Secondary | ICD-10-CM | POA: Diagnosis not present

## 2019-09-22 DIAGNOSIS — H35052 Retinal neovascularization, unspecified, left eye: Secondary | ICD-10-CM | POA: Diagnosis not present

## 2019-09-24 ENCOUNTER — Telehealth: Payer: Self-pay | Admitting: Internal Medicine

## 2019-09-24 NOTE — Telephone Encounter (Signed)
-----   Message from Salvatore Marvel sent at 09/21/2019 11:14 AM EDT ----- Regarding: remove pcp Pt under doctors at assisted living facility

## 2019-09-27 ENCOUNTER — Ambulatory Visit: Payer: Medicare Other

## 2019-10-03 DIAGNOSIS — F4322 Adjustment disorder with anxiety: Secondary | ICD-10-CM | POA: Diagnosis not present

## 2019-10-11 DIAGNOSIS — K219 Gastro-esophageal reflux disease without esophagitis: Secondary | ICD-10-CM | POA: Diagnosis not present

## 2019-10-11 DIAGNOSIS — G2581 Restless legs syndrome: Secondary | ICD-10-CM | POA: Diagnosis not present

## 2019-10-11 DIAGNOSIS — K589 Irritable bowel syndrome without diarrhea: Secondary | ICD-10-CM | POA: Diagnosis not present

## 2019-10-11 DIAGNOSIS — R32 Unspecified urinary incontinence: Secondary | ICD-10-CM | POA: Diagnosis not present

## 2019-10-11 DIAGNOSIS — I10 Essential (primary) hypertension: Secondary | ICD-10-CM | POA: Diagnosis not present

## 2019-10-11 DIAGNOSIS — Z9181 History of falling: Secondary | ICD-10-CM | POA: Diagnosis not present

## 2019-10-11 DIAGNOSIS — G8929 Other chronic pain: Secondary | ICD-10-CM | POA: Diagnosis not present

## 2019-10-11 DIAGNOSIS — Z87440 Personal history of urinary (tract) infections: Secondary | ICD-10-CM | POA: Diagnosis not present

## 2019-10-11 DIAGNOSIS — M48062 Spinal stenosis, lumbar region with neurogenic claudication: Secondary | ICD-10-CM | POA: Diagnosis not present

## 2019-10-11 DIAGNOSIS — H9193 Unspecified hearing loss, bilateral: Secondary | ICD-10-CM | POA: Diagnosis not present

## 2019-10-16 DIAGNOSIS — Z79899 Other long term (current) drug therapy: Secondary | ICD-10-CM | POA: Diagnosis not present

## 2019-10-16 DIAGNOSIS — H318 Other specified disorders of choroid: Secondary | ICD-10-CM | POA: Diagnosis not present

## 2019-10-16 DIAGNOSIS — H35051 Retinal neovascularization, unspecified, right eye: Secondary | ICD-10-CM | POA: Diagnosis not present

## 2019-10-16 DIAGNOSIS — I10 Essential (primary) hypertension: Secondary | ICD-10-CM | POA: Diagnosis not present

## 2019-10-16 DIAGNOSIS — M1990 Unspecified osteoarthritis, unspecified site: Secondary | ICD-10-CM | POA: Diagnosis not present

## 2019-10-16 DIAGNOSIS — H4311 Vitreous hemorrhage, right eye: Secondary | ICD-10-CM | POA: Diagnosis not present

## 2019-10-16 DIAGNOSIS — F0390 Unspecified dementia without behavioral disturbance: Secondary | ICD-10-CM | POA: Diagnosis not present

## 2019-10-16 DIAGNOSIS — K219 Gastro-esophageal reflux disease without esophagitis: Secondary | ICD-10-CM | POA: Diagnosis not present

## 2019-10-16 DIAGNOSIS — H3561 Retinal hemorrhage, right eye: Secondary | ICD-10-CM | POA: Diagnosis not present

## 2019-10-17 DIAGNOSIS — R2681 Unsteadiness on feet: Secondary | ICD-10-CM | POA: Diagnosis not present

## 2019-10-17 DIAGNOSIS — F4322 Adjustment disorder with anxiety: Secondary | ICD-10-CM | POA: Diagnosis not present

## 2019-10-17 DIAGNOSIS — I10 Essential (primary) hypertension: Secondary | ICD-10-CM | POA: Diagnosis not present

## 2019-10-17 DIAGNOSIS — K219 Gastro-esophageal reflux disease without esophagitis: Secondary | ICD-10-CM | POA: Diagnosis not present

## 2019-10-17 DIAGNOSIS — F0390 Unspecified dementia without behavioral disturbance: Secondary | ICD-10-CM | POA: Diagnosis not present

## 2019-10-18 DIAGNOSIS — H31091 Other chorioretinal scars, right eye: Secondary | ICD-10-CM | POA: Diagnosis not present

## 2019-10-18 DIAGNOSIS — H4311 Vitreous hemorrhage, right eye: Secondary | ICD-10-CM | POA: Diagnosis not present

## 2019-10-25 DIAGNOSIS — H353222 Exudative age-related macular degeneration, left eye, with inactive choroidal neovascularization: Secondary | ICD-10-CM | POA: Diagnosis not present

## 2019-10-25 DIAGNOSIS — H35051 Retinal neovascularization, unspecified, right eye: Secondary | ICD-10-CM | POA: Diagnosis not present

## 2019-10-31 DIAGNOSIS — F4322 Adjustment disorder with anxiety: Secondary | ICD-10-CM | POA: Diagnosis not present

## 2019-11-02 DIAGNOSIS — E559 Vitamin D deficiency, unspecified: Secondary | ICD-10-CM | POA: Diagnosis not present

## 2019-11-02 DIAGNOSIS — D518 Other vitamin B12 deficiency anemias: Secondary | ICD-10-CM | POA: Diagnosis not present

## 2019-11-02 DIAGNOSIS — E038 Other specified hypothyroidism: Secondary | ICD-10-CM | POA: Diagnosis not present

## 2019-11-02 DIAGNOSIS — E7849 Other hyperlipidemia: Secondary | ICD-10-CM | POA: Diagnosis not present

## 2019-11-02 DIAGNOSIS — Z79899 Other long term (current) drug therapy: Secondary | ICD-10-CM | POA: Diagnosis not present

## 2019-11-03 IMAGING — DX DG ABDOMEN 1V
1 series · 1 of 1 positions shown · non-contrast
Comparison: 06/28/2017.

CLINICAL DATA: [AGE] female constipation. History of
small-bowel obstruction. Abdominal distension. Initial encounter.

EXAM:
ABDOMEN - 1 VIEW.  Submitted for review 04/11/2018.

[abdomen standing ap]
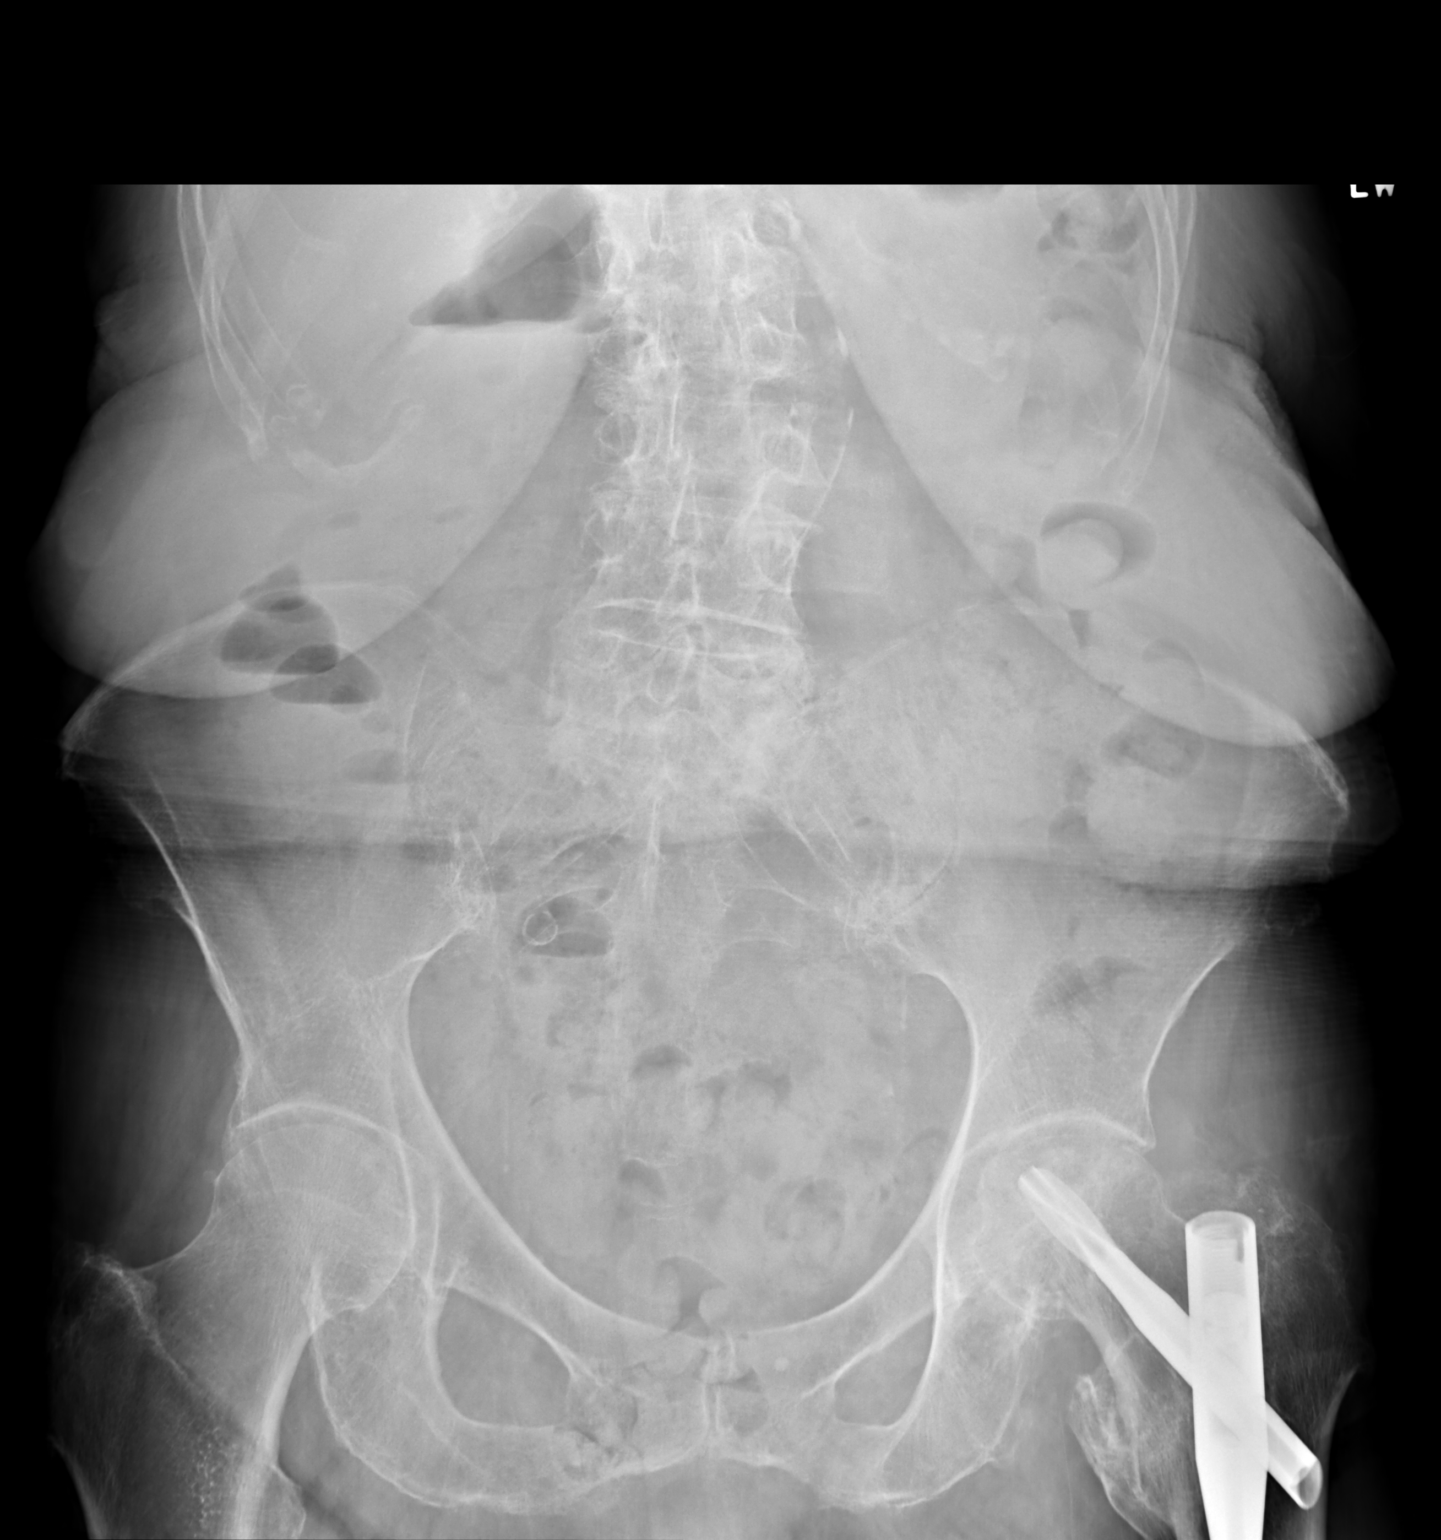

[1 of 1 positions shown; findings below may reference images not displayed]

FINDINGS: Single upright view of the abdomen not including lung bases reveals
moderate stool throughout the colon. No abnormal gas/gas
fluid-filled small bowel loops.

Scoliosis lumbar spine convex left. Calcified aorta. Prior left hip
surgery.

Can not assess for free air as diaphragms not included on present
exam.
IMPRESSION: 1. Moderate stool throughout the colon.
2. No plain film evidence of bowel obstruction.
3.  Aortic Atherosclerosis ([id number].V).

## 2019-11-13 DIAGNOSIS — M549 Dorsalgia, unspecified: Secondary | ICD-10-CM | POA: Diagnosis not present

## 2019-11-13 DIAGNOSIS — I10 Essential (primary) hypertension: Secondary | ICD-10-CM | POA: Diagnosis not present

## 2019-11-13 DIAGNOSIS — K219 Gastro-esophageal reflux disease without esophagitis: Secondary | ICD-10-CM | POA: Diagnosis not present

## 2019-11-13 DIAGNOSIS — G2581 Restless legs syndrome: Secondary | ICD-10-CM | POA: Diagnosis not present

## 2019-11-16 DIAGNOSIS — M810 Age-related osteoporosis without current pathological fracture: Secondary | ICD-10-CM | POA: Diagnosis not present

## 2019-11-16 DIAGNOSIS — H3530 Unspecified macular degeneration: Secondary | ICD-10-CM | POA: Diagnosis not present

## 2019-11-16 DIAGNOSIS — N3281 Overactive bladder: Secondary | ICD-10-CM | POA: Diagnosis not present

## 2019-11-16 DIAGNOSIS — M5030 Other cervical disc degeneration, unspecified cervical region: Secondary | ICD-10-CM | POA: Diagnosis not present

## 2019-11-16 DIAGNOSIS — I872 Venous insufficiency (chronic) (peripheral): Secondary | ICD-10-CM | POA: Diagnosis not present

## 2019-11-16 DIAGNOSIS — K589 Irritable bowel syndrome without diarrhea: Secondary | ICD-10-CM | POA: Diagnosis not present

## 2019-11-16 DIAGNOSIS — M48062 Spinal stenosis, lumbar region with neurogenic claudication: Secondary | ICD-10-CM | POA: Diagnosis not present

## 2019-11-16 DIAGNOSIS — K219 Gastro-esophageal reflux disease without esophagitis: Secondary | ICD-10-CM | POA: Diagnosis not present

## 2019-11-16 DIAGNOSIS — G8929 Other chronic pain: Secondary | ICD-10-CM | POA: Diagnosis not present

## 2019-11-16 DIAGNOSIS — M11262 Other chondrocalcinosis, left knee: Secondary | ICD-10-CM | POA: Diagnosis not present

## 2019-11-16 DIAGNOSIS — M4802 Spinal stenosis, cervical region: Secondary | ICD-10-CM | POA: Diagnosis not present

## 2019-11-16 DIAGNOSIS — H9193 Unspecified hearing loss, bilateral: Secondary | ICD-10-CM | POA: Diagnosis not present

## 2019-11-16 DIAGNOSIS — K5900 Constipation, unspecified: Secondary | ICD-10-CM | POA: Diagnosis not present

## 2019-11-16 DIAGNOSIS — G629 Polyneuropathy, unspecified: Secondary | ICD-10-CM | POA: Diagnosis not present

## 2019-11-16 DIAGNOSIS — I700 Atherosclerosis of aorta: Secondary | ICD-10-CM | POA: Diagnosis not present

## 2019-11-16 DIAGNOSIS — I10 Essential (primary) hypertension: Secondary | ICD-10-CM | POA: Diagnosis not present

## 2019-11-21 DIAGNOSIS — F4322 Adjustment disorder with anxiety: Secondary | ICD-10-CM | POA: Diagnosis not present

## 2019-11-29 DIAGNOSIS — H353113 Nonexudative age-related macular degeneration, right eye, advanced atrophic without subfoveal involvement: Secondary | ICD-10-CM | POA: Diagnosis not present

## 2019-11-29 DIAGNOSIS — H4311 Vitreous hemorrhage, right eye: Secondary | ICD-10-CM | POA: Diagnosis not present

## 2019-12-01 DIAGNOSIS — M5030 Other cervical disc degeneration, unspecified cervical region: Secondary | ICD-10-CM | POA: Diagnosis not present

## 2019-12-01 DIAGNOSIS — I10 Essential (primary) hypertension: Secondary | ICD-10-CM | POA: Diagnosis not present

## 2019-12-01 DIAGNOSIS — G8929 Other chronic pain: Secondary | ICD-10-CM | POA: Diagnosis not present

## 2019-12-01 DIAGNOSIS — M48062 Spinal stenosis, lumbar region with neurogenic claudication: Secondary | ICD-10-CM | POA: Diagnosis not present

## 2019-12-05 DIAGNOSIS — F4322 Adjustment disorder with anxiety: Secondary | ICD-10-CM | POA: Diagnosis not present

## 2019-12-08 DIAGNOSIS — K219 Gastro-esophageal reflux disease without esophagitis: Secondary | ICD-10-CM | POA: Diagnosis not present

## 2019-12-08 DIAGNOSIS — F4320 Adjustment disorder, unspecified: Secondary | ICD-10-CM | POA: Diagnosis not present

## 2019-12-11 DIAGNOSIS — K5900 Constipation, unspecified: Secondary | ICD-10-CM | POA: Diagnosis not present

## 2019-12-11 DIAGNOSIS — F0390 Unspecified dementia without behavioral disturbance: Secondary | ICD-10-CM | POA: Diagnosis not present

## 2019-12-11 DIAGNOSIS — I10 Essential (primary) hypertension: Secondary | ICD-10-CM | POA: Diagnosis not present

## 2019-12-11 DIAGNOSIS — G2581 Restless legs syndrome: Secondary | ICD-10-CM | POA: Diagnosis not present

## 2019-12-19 DIAGNOSIS — F4322 Adjustment disorder with anxiety: Secondary | ICD-10-CM | POA: Diagnosis not present

## 2019-12-19 DIAGNOSIS — H9193 Unspecified hearing loss, bilateral: Secondary | ICD-10-CM | POA: Diagnosis not present

## 2019-12-19 DIAGNOSIS — M4802 Spinal stenosis, cervical region: Secondary | ICD-10-CM | POA: Diagnosis not present

## 2019-12-19 DIAGNOSIS — I10 Essential (primary) hypertension: Secondary | ICD-10-CM | POA: Diagnosis not present

## 2019-12-19 DIAGNOSIS — N390 Urinary tract infection, site not specified: Secondary | ICD-10-CM | POA: Diagnosis not present

## 2019-12-19 DIAGNOSIS — I872 Venous insufficiency (chronic) (peripheral): Secondary | ICD-10-CM | POA: Diagnosis not present

## 2019-12-19 DIAGNOSIS — M48062 Spinal stenosis, lumbar region with neurogenic claudication: Secondary | ICD-10-CM | POA: Diagnosis not present

## 2019-12-19 DIAGNOSIS — I700 Atherosclerosis of aorta: Secondary | ICD-10-CM | POA: Diagnosis not present

## 2019-12-19 DIAGNOSIS — M810 Age-related osteoporosis without current pathological fracture: Secondary | ICD-10-CM | POA: Diagnosis not present

## 2019-12-19 DIAGNOSIS — M5030 Other cervical disc degeneration, unspecified cervical region: Secondary | ICD-10-CM | POA: Diagnosis not present

## 2019-12-19 DIAGNOSIS — H3530 Unspecified macular degeneration: Secondary | ICD-10-CM | POA: Diagnosis not present

## 2019-12-19 DIAGNOSIS — K589 Irritable bowel syndrome without diarrhea: Secondary | ICD-10-CM | POA: Diagnosis not present

## 2019-12-19 DIAGNOSIS — K5900 Constipation, unspecified: Secondary | ICD-10-CM | POA: Diagnosis not present

## 2019-12-19 DIAGNOSIS — M11262 Other chondrocalcinosis, left knee: Secondary | ICD-10-CM | POA: Diagnosis not present

## 2019-12-19 DIAGNOSIS — G629 Polyneuropathy, unspecified: Secondary | ICD-10-CM | POA: Diagnosis not present

## 2019-12-19 DIAGNOSIS — N3281 Overactive bladder: Secondary | ICD-10-CM | POA: Diagnosis not present

## 2019-12-19 DIAGNOSIS — G8929 Other chronic pain: Secondary | ICD-10-CM | POA: Diagnosis not present

## 2019-12-19 DIAGNOSIS — K219 Gastro-esophageal reflux disease without esophagitis: Secondary | ICD-10-CM | POA: Diagnosis not present

## 2020-01-01 DIAGNOSIS — F4322 Adjustment disorder with anxiety: Secondary | ICD-10-CM | POA: Diagnosis not present

## 2020-01-05 DIAGNOSIS — F0390 Unspecified dementia without behavioral disturbance: Secondary | ICD-10-CM | POA: Diagnosis not present

## 2020-01-05 DIAGNOSIS — F4320 Adjustment disorder, unspecified: Secondary | ICD-10-CM | POA: Diagnosis not present

## 2020-01-10 DIAGNOSIS — R0989 Other specified symptoms and signs involving the circulatory and respiratory systems: Secondary | ICD-10-CM | POA: Diagnosis not present

## 2020-01-10 DIAGNOSIS — I6523 Occlusion and stenosis of bilateral carotid arteries: Secondary | ICD-10-CM | POA: Diagnosis not present

## 2020-01-10 DIAGNOSIS — R590 Localized enlarged lymph nodes: Secondary | ICD-10-CM | POA: Diagnosis not present

## 2020-01-10 DIAGNOSIS — R229 Localized swelling, mass and lump, unspecified: Secondary | ICD-10-CM | POA: Diagnosis not present

## 2020-01-10 DIAGNOSIS — D492 Neoplasm of unspecified behavior of bone, soft tissue, and skin: Secondary | ICD-10-CM | POA: Diagnosis not present

## 2020-01-11 DIAGNOSIS — R600 Localized edema: Secondary | ICD-10-CM | POA: Diagnosis not present

## 2020-01-11 DIAGNOSIS — L853 Xerosis cutis: Secondary | ICD-10-CM | POA: Diagnosis not present

## 2020-01-11 DIAGNOSIS — N390 Urinary tract infection, site not specified: Secondary | ICD-10-CM | POA: Diagnosis not present

## 2020-01-11 DIAGNOSIS — I10 Essential (primary) hypertension: Secondary | ICD-10-CM | POA: Diagnosis not present

## 2020-01-12 DIAGNOSIS — E042 Nontoxic multinodular goiter: Secondary | ICD-10-CM | POA: Diagnosis not present

## 2020-01-12 DIAGNOSIS — R221 Localized swelling, mass and lump, neck: Secondary | ICD-10-CM | POA: Diagnosis not present

## 2020-01-12 DIAGNOSIS — I714 Abdominal aortic aneurysm, without rupture: Secondary | ICD-10-CM | POA: Diagnosis not present

## 2020-01-12 DIAGNOSIS — I700 Atherosclerosis of aorta: Secondary | ICD-10-CM | POA: Diagnosis not present

## 2020-01-15 DIAGNOSIS — I70229 Atherosclerosis of native arteries of extremities with rest pain, unspecified extremity: Secondary | ICD-10-CM | POA: Diagnosis not present

## 2020-01-15 DIAGNOSIS — I739 Peripheral vascular disease, unspecified: Secondary | ICD-10-CM | POA: Diagnosis not present

## 2020-01-16 DIAGNOSIS — F4322 Adjustment disorder with anxiety: Secondary | ICD-10-CM | POA: Diagnosis not present

## 2020-01-16 DIAGNOSIS — R011 Cardiac murmur, unspecified: Secondary | ICD-10-CM | POA: Diagnosis not present

## 2020-01-29 ENCOUNTER — Inpatient Hospital Stay
Admission: EM | Admit: 2020-01-29 | Discharge: 2020-01-31 | DRG: 682 | Disposition: A | Discharge status: Skilled Nursing Facility | Payer: Medicare Other | Source: Skilled Nursing Facility | Attending: Internal Medicine | Admitting: Internal Medicine

## 2020-01-29 ENCOUNTER — Emergency Department: Payer: Medicare Other

## 2020-01-29 DIAGNOSIS — E860 Dehydration: Secondary | ICD-10-CM | POA: Diagnosis not present

## 2020-01-29 DIAGNOSIS — Z66 Do not resuscitate: Secondary | ICD-10-CM | POA: Diagnosis present

## 2020-01-29 DIAGNOSIS — Z8249 Family history of ischemic heart disease and other diseases of the circulatory system: Secondary | ICD-10-CM

## 2020-01-29 DIAGNOSIS — Z85828 Personal history of other malignant neoplasm of skin: Secondary | ICD-10-CM | POA: Diagnosis not present

## 2020-01-29 DIAGNOSIS — Z8719 Personal history of other diseases of the digestive system: Secondary | ICD-10-CM

## 2020-01-29 DIAGNOSIS — G2581 Restless legs syndrome: Secondary | ICD-10-CM | POA: Diagnosis present

## 2020-01-29 DIAGNOSIS — I10 Essential (primary) hypertension: Secondary | ICD-10-CM | POA: Diagnosis not present

## 2020-01-29 DIAGNOSIS — D509 Iron deficiency anemia, unspecified: Secondary | ICD-10-CM | POA: Diagnosis not present

## 2020-01-29 DIAGNOSIS — G8929 Other chronic pain: Secondary | ICD-10-CM | POA: Diagnosis present

## 2020-01-29 DIAGNOSIS — Z20822 Contact with and (suspected) exposure to covid-19: Secondary | ICD-10-CM | POA: Diagnosis present

## 2020-01-29 DIAGNOSIS — F0390 Unspecified dementia without behavioral disturbance: Secondary | ICD-10-CM | POA: Diagnosis present

## 2020-01-29 DIAGNOSIS — G9341 Metabolic encephalopathy: Secondary | ICD-10-CM | POA: Diagnosis not present

## 2020-01-29 DIAGNOSIS — M6282 Rhabdomyolysis: Secondary | ICD-10-CM | POA: Diagnosis not present

## 2020-01-29 DIAGNOSIS — I110 Hypertensive heart disease with heart failure: Secondary | ICD-10-CM | POA: Diagnosis present

## 2020-01-29 DIAGNOSIS — E871 Hypo-osmolality and hyponatremia: Secondary | ICD-10-CM | POA: Diagnosis not present

## 2020-01-29 DIAGNOSIS — Z885 Allergy status to narcotic agent status: Secondary | ICD-10-CM

## 2020-01-29 DIAGNOSIS — K219 Gastro-esophageal reflux disease without esophagitis: Secondary | ICD-10-CM | POA: Diagnosis present

## 2020-01-29 DIAGNOSIS — Z801 Family history of malignant neoplasm of trachea, bronchus and lung: Secondary | ICD-10-CM

## 2020-01-29 DIAGNOSIS — N390 Urinary tract infection, site not specified: Secondary | ICD-10-CM | POA: Diagnosis not present

## 2020-01-29 DIAGNOSIS — Z881 Allergy status to other antibiotic agents status: Secondary | ICD-10-CM

## 2020-01-29 DIAGNOSIS — I5032 Chronic diastolic (congestive) heart failure: Secondary | ICD-10-CM | POA: Diagnosis present

## 2020-01-29 DIAGNOSIS — Z87440 Personal history of urinary (tract) infections: Secondary | ICD-10-CM

## 2020-01-29 DIAGNOSIS — N179 Acute kidney failure, unspecified: Secondary | ICD-10-CM | POA: Diagnosis not present

## 2020-01-29 DIAGNOSIS — Z79899 Other long term (current) drug therapy: Secondary | ICD-10-CM

## 2020-01-29 DIAGNOSIS — N201 Calculus of ureter: Secondary | ICD-10-CM | POA: Diagnosis present

## 2020-01-29 DIAGNOSIS — R531 Weakness: Secondary | ICD-10-CM | POA: Diagnosis not present

## 2020-01-29 DIAGNOSIS — M545 Low back pain, unspecified: Secondary | ICD-10-CM | POA: Diagnosis not present

## 2020-01-29 DIAGNOSIS — M810 Age-related osteoporosis without current pathological fracture: Secondary | ICD-10-CM | POA: Diagnosis not present

## 2020-01-29 DIAGNOSIS — E785 Hyperlipidemia, unspecified: Secondary | ICD-10-CM | POA: Diagnosis not present

## 2020-01-29 DIAGNOSIS — C7802 Secondary malignant neoplasm of left lung: Secondary | ICD-10-CM | POA: Diagnosis present

## 2020-01-29 DIAGNOSIS — Z823 Family history of stroke: Secondary | ICD-10-CM

## 2020-01-29 DIAGNOSIS — R5381 Other malaise: Secondary | ICD-10-CM | POA: Diagnosis not present

## 2020-01-29 DIAGNOSIS — C179 Malignant neoplasm of small intestine, unspecified: Secondary | ICD-10-CM | POA: Diagnosis present

## 2020-01-29 DIAGNOSIS — J9811 Atelectasis: Secondary | ICD-10-CM | POA: Diagnosis not present

## 2020-01-29 DIAGNOSIS — Z8261 Family history of arthritis: Secondary | ICD-10-CM

## 2020-01-29 DIAGNOSIS — R269 Unspecified abnormalities of gait and mobility: Secondary | ICD-10-CM

## 2020-01-29 DIAGNOSIS — K8020 Calculus of gallbladder without cholecystitis without obstruction: Secondary | ICD-10-CM | POA: Diagnosis present

## 2020-01-29 DIAGNOSIS — N3001 Acute cystitis with hematuria: Secondary | ICD-10-CM | POA: Diagnosis present

## 2020-01-29 DIAGNOSIS — R32 Unspecified urinary incontinence: Secondary | ICD-10-CM | POA: Diagnosis not present

## 2020-01-29 DIAGNOSIS — Z803 Family history of malignant neoplasm of breast: Secondary | ICD-10-CM

## 2020-01-29 DIAGNOSIS — Z833 Family history of diabetes mellitus: Secondary | ICD-10-CM

## 2020-01-29 DIAGNOSIS — Z90710 Acquired absence of both cervix and uterus: Secondary | ICD-10-CM

## 2020-01-29 LAB — COMPREHENSIVE METABOLIC PANEL
ALT: 32 U/L (ref 0–44)
AST: 66 U/L — ABNORMAL HIGH (ref 15–41)
Albumin: 3.2 g/dL — ABNORMAL LOW (ref 3.5–5.0)
Alkaline Phosphatase: 78 U/L (ref 38–126)
Anion gap: 11 (ref 5–15)
BUN: 36 mg/dL — ABNORMAL HIGH (ref 8–23)
CO2: 27 mmol/L (ref 22–32)
Calcium: 8.4 mg/dL — ABNORMAL LOW (ref 8.9–10.3)
Chloride: 93 mmol/L — ABNORMAL LOW (ref 98–111)
Creatinine, Ser: 1.07 mg/dL — ABNORMAL HIGH (ref 0.44–1.00)
GFR, Estimated: 48 mL/min — ABNORMAL LOW (ref >60)
Glucose, Bld: 114 mg/dL — ABNORMAL HIGH (ref 70–99)
Potassium: 3.8 mmol/L (ref 3.5–5.1)
Sodium: 131 mmol/L — ABNORMAL LOW (ref 135–145)
Total Bilirubin: 1 mg/dL (ref 0.3–1.2)
Total Protein: 7.8 g/dL (ref 6.5–8.1)

## 2020-01-29 LAB — CBC WITH DIFFERENTIAL/PLATELET
Abs Immature Granulocytes: 0.04 10*3/uL (ref 0.00–0.07)
Basophils Absolute: 0 10*3/uL (ref 0.0–0.1)
Basophils Relative: 0 %
Eosinophils Absolute: 0 10*3/uL (ref 0.0–0.5)
Eosinophils Relative: 0 %
HCT: 35.2 % — ABNORMAL LOW (ref 36.0–46.0)
Hemoglobin: 11.6 g/dL — ABNORMAL LOW (ref 12.0–15.0)
Immature Granulocytes: 0 %
Lymphocytes Relative: 10 %
Lymphs Abs: 1.3 10*3/uL (ref 0.7–4.0)
MCH: 27.6 pg (ref 26.0–34.0)
MCHC: 33 g/dL (ref 30.0–36.0)
MCV: 83.6 fL (ref 80.0–100.0)
Monocytes Absolute: 1.1 10*3/uL — ABNORMAL HIGH (ref 0.1–1.0)
Monocytes Relative: 9 %
Neutro Abs: 10 10*3/uL — ABNORMAL HIGH (ref 1.7–7.7)
Neutrophils Relative %: 81 %
Platelets: 219 10*3/uL (ref 150–400)
RBC: 4.21 MIL/uL (ref 3.87–5.11)
RDW: 14 % (ref 11.5–15.5)
WBC: 12.4 10*3/uL — ABNORMAL HIGH (ref 4.0–10.5)
nRBC: 0 % (ref 0.0–0.2)

## 2020-01-29 LAB — URINALYSIS, COMPLETE (UACMP) WITH MICROSCOPIC
Bilirubin Urine: NEGATIVE
Glucose, UA: NEGATIVE mg/dL
Ketones, ur: 5 mg/dL — AB
Nitrite: NEGATIVE
Protein, ur: 100 mg/dL — AB
Specific Gravity, Urine: 1.018 (ref 1.005–1.030)
WBC, UA: >50 WBC/hpf — ABNORMAL HIGH (ref 0–5)
pH: 5 (ref 5.0–8.0)

## 2020-01-29 LAB — PHOSPHORUS: Phosphorus: 3.1 mg/dL (ref 2.5–4.6)

## 2020-01-29 LAB — MAGNESIUM: Magnesium: 2.4 mg/dL (ref 1.7–2.4)

## 2020-01-29 MED ORDER — SODIUM CHLORIDE 0.9 % IV BOLUS
1000.0000 mL | Freq: Once | INTRAVENOUS | Status: AC
  Administered 2020-01-29: 1000 mL via INTRAVENOUS

## 2020-01-29 MED ORDER — SODIUM CHLORIDE 0.9 % IV SOLN
1.0000 g | INTRAVENOUS | Status: DC
  Filled 2020-01-29 (×2): qty 10

## 2020-01-29 MED ORDER — SODIUM CHLORIDE 0.9 % IV SOLN
1.0000 g | Freq: Once | INTRAVENOUS | Status: AC
  Administered 2020-01-29: 1 g via INTRAVENOUS
  Filled 2020-01-29: qty 10

## 2020-01-29 NOTE — ED Provider Notes (Signed)
[facility] Emergency Department Provider Note  ____________________________________________   First MD Initiated Contact with Patient 01/29/20 2029     (approximate)  I have reviewed the triage vital signs and the nursing notes.   HISTORY  Chief Complaint Medical Management of Chronic Issues (PER EMS PT STOPPED WALKING , NO CO OF PAIN,  PT HAS DEMENTIA )    HPI Kristine Jacobson is a 86 y.o. female  Here with generalized weakness.  Per EMS report, they were called to pt's house because she has been more weak than usual the past several days. She's been slowly declining "for a while" but has been more weak, confused the past 3-4 days. She does state she feels weak and that the last few days are "a haze," but denies any other specific complaints. She does not some intermittent suprapubic discomfort but does not recall if she's had urinary sx. She denies any CP, SOB. No known sick contacts. No known recent med changes.       Past Medical History:  Diagnosis Date   Allergy    seasonal    Arthritis    shoulders, hands   Cancer (HCC)    BCC face-Dr. Evorn Gong    Chronic neck pain    2/2 bulging discs    HBP (high blood pressure)    Hearing aid worn    bilateral   Macular degeneration    dx'ed 2015 dry now left wet and right mild wet [facility] Dr. Michelene Heady   Macular degeneration, age related    Osteoporosis    Overactive bladder    Restless leg    SBO (small bowel obstruction) ([place])    01/2017, 02/2018   Spinal stenosis    Urinary incontinence    s/p monthly PTNS with Dr. Jacqlyn Larsen, no help with overactive bladder with meds nor Botox    UTI (urinary tract infection)    Vertigo    no episodes for several years   Wears dentures    partial upper and lower    Patient Active Problem List   Diagnosis Date Noted   Chronic UTI 02/22/2019   GERD (gastroesophageal reflux disease) 11/22/2018   Lumbar disc disease 11/22/2018   Skin  cancer 11/22/2018   Memory loss 07/08/2018   SBO (small bowel obstruction) ([place]) 04/08/2018   Other constipation 04/08/2018   Chronic venous insufficiency 01/18/2018   History of skin cancer 12/24/2017   Osteoporosis 12/24/2017   Atherosclerosis of native arteries of the extremities with ulceration ([place]) 09/17/2017   Bilateral leg edema 08/17/2017   DDD (degenerative disc disease), cervical 08/04/2017   Foraminal stenosis of cervical region 08/04/2017   Chondrocalcinosis of left knee 08/04/2017   Abnormal gait 08/04/2017   Atherosclerosis of aorta ([place]) 08/04/2017   Ulcer of left lower extremity ([place]) 08/04/2017   Neuropathy 06/28/2017   Hypertension 03/03/2017   Macular degeneration 03/03/2017   Overactive bladder 03/03/2017   Restless leg syndrome 03/03/2017   Spinal stenosis 03/03/2017   Osteoarthritis 03/03/2017   Low back pain 03/03/2017   Closed displaced intertrochanteric fracture of left femur with routine healing 05/26/2015   Fracture of left hip requiring operative repair ([place]) 04/14/2015   Closed left hip fracture ([place]) 02/27/2015   SUI (stress urinary incontinence, female) 09/21/2014   Flank pain, acute 11/22/2013   Chronic cystitis 04/12/2013   Incomplete emptying of bladder 04/12/2013   Increased frequency of urination 04/12/2013   Urge incontinence 04/12/2013    Past Surgical History:  Procedure  Laterality Date   ABDOMINAL HYSTERECTOMY     1967   APPENDECTOMY     1967   BLADDER SURGERY     bladder suspension; 2005, 2011-Dr. Cope   BREAST EXCISIONAL BIOPSY Right 1970   neg   BROW LIFT Bilateral 12/01/2016   Procedure: BLEPHAROPLASTY upper eyelid with excess skin;  Surgeon: Karle Starch, MD;  Location: [place];  Service: Ophthalmology;  Laterality: Bilateral;  MAC   CARPAL TUNNEL RELEASE     right 2016    Esophageal dilitation     eyelid surgery     2018   FRACTURE SURGERY     HIP SURGERY     left  2016    INTRAMEDULLARY (IM) NAIL INTERTROCHANTERIC Left 02/27/2015   Procedure: INTRAMEDULLARY (IM) NAIL INTERTROCHANTRIC;  Surgeon: Dereck Leep, MD;  Location: [facility];  Service: Orthopedics;  Laterality: Left;   PTOSIS REPAIR Bilateral 12/01/2016   Procedure: PTOSIS REPAIR  repair resect ex;  Surgeon: Karle Starch, MD;  Location: [place];  Service: Ophthalmology;  Laterality: Bilateral;   TONSILLECTOMY      Prior to Admission medications   Medication Sig Start Date End Date Taking? Authorizing Provider  acetaminophen (TYLENOL) 500 MG tablet Take 1,000 mg by mouth 2 (two) times daily as needed (pain).     [provider]  amLODipine (NORVASC) 5 MG tablet Take 1 tablet (5 mg total) by mouth daily. 02/24/19   McLean-Scocuzza, Nino Glow, MD  bisacodyl (DULCOLAX) 5 MG EC tablet Take 5 mg by mouth daily as needed for moderate constipation.    [provider]  Calcium Carb-Cholecalciferol (CALCIUM 600 + D PO) Take 600 mg by mouth 2 (two) times daily.    [provider]  Calcium Carbonate-Vitamin D (CALTRATE 600+D) 600-400 MG-UNIT tablet Take by mouth.    [provider]  carvedilol (COREG) 6.25 MG tablet Take 1 tablet (6.25 mg total) by mouth 2 (two) times daily with a meal. 02/24/19   McLean-Scocuzza, Nino Glow, MD  doxycycline (VIBRA-TABS) 100 MG tablet Take 1 tablet (100 mg total) by mouth daily. 02/21/19   McLean-Scocuzza, Nino Glow, MD  gabapentin (NEURONTIN) 100 MG capsule Take 2 capsules (200 mg total) by mouth 3 (three) times daily. 02/24/19   McLean-Scocuzza, Nino Glow, MD  hydrALAZINE (APRESOLINE) 25 MG tablet Take 1 tablet (25 mg total) by mouth 2 (two) times daily. 02/24/19   McLean-Scocuzza, Nino Glow, MD  linaclotide Rolan Lipa) 290 MCG CAPS capsule Take 1 capsule (290 mcg total) by mouth daily before breakfast. 02/24/19   McLean-Scocuzza, Nino Glow, MD  loratadine (CLARITIN) 10 MG tablet Take 10 mg by mouth as needed.    [provider]    losartan-hydrochlorothiazide (HYZAAR) 100-25 MG tablet Take 1 tablet by mouth daily. 02/24/19   McLean-Scocuzza, Nino Glow, MD  lubiprostone (AMITIZA) 8 MCG capsule Take 1 capsule (8 mcg total) by mouth 2 (two) times daily with a meal. 08/08/19   Virgel Manifold, MD  Multiple Vitamin (MULTIVITAMIN WITH MINERALS) TABS tablet Take 1 tablet by mouth daily. Centrum Silver    [provider]  Multiple Vitamins-Minerals (PRESERVISION AREDS 2) CAPS Take 1 capsule by mouth 2 (two) times daily.    [provider]  Propylene Glycol (SYSTANE BALANCE OP) Place 1 drop into both eyes 3 (three) times daily as needed (dry eyes).    [provider]  rOPINIRole (REQUIP) 0.5 MG tablet TAKE 1 TABLET BY MOUTH IN THE MORNING, 1 TABLET BY MOUTH  AT LUNCH, AND 1 TABLET BY MOUTH IN THE EVENING. 02/24/19   McLean-Scocuzza, Nino Glow, MD  traMADol (ULTRAM) 50 MG tablet Take 1 tablet (50 mg total) by mouth every 6 (six) hours as needed for moderate pain. Patient taking differently: Take 50 mg by mouth daily as needed for moderate pain.  09/15/15   [place]na Lions, PA-C    Allergies Macrobid [nitrofurantoin macrocrystal] and Codeine  Family History  Problem Relation Age of Onset   Diabetes Mother    Arthritis Mother    Stroke Mother    Heart disease Father    Diabetes Sister    Cancer Brother        lung 2/2 asbestos    Diabetes Daughter    Cancer Daughter        DCIS breast    Diabetes Brother    Diabetes Brother    Heart attack Grandson        died age 61 in  24-May-2017   Breast cancer Neg Hx     Social History Social History   Tobacco Use   Smoking status: Never Smoker   Smokeless tobacco: Never Used  Scientific laboratory technician Use: Never used  Substance Use Topics   Alcohol use: No   Drug use: No    Review of Systems  Review of Systems  Constitutional: Positive for fatigue. Negative for chills and fever.  HENT: Negative for sore throat.   Respiratory:  Negative for shortness of breath.   Cardiovascular: Negative for chest pain.  Gastrointestinal: Negative for abdominal pain.  Genitourinary: Negative for flank pain.  Musculoskeletal: Negative for neck pain.  Skin: Negative for rash and wound.  Allergic/Immunologic: Negative for immunocompromised state.  Neurological: Negative for weakness and numbness.  Hematological: Does not bruise/bleed easily.  Psychiatric/Behavioral: Positive for confusion.     ____________________________________________  PHYSICAL EXAM:      VITAL SIGNS: ED Triage Vitals [01/29/20 2028]  Enc Vitals Group     BP (!) 152/76     Pulse Rate 85     Resp 17     Temp 98.5 F (36.9 C)     Temp Source Oral     SpO2 98 %     Weight      Height      Head Circumference      Peak Flow      Pain Score 0     Pain Loc      Pain Edu?      Excl. in [place]?      Physical Exam Vitals and nursing note reviewed.  Constitutional:      General: She is not in acute distress.    Appearance: She is well-developed.  HENT:     Head: Normocephalic and atraumatic.     Mouth/Throat:     Mouth: Mucous membranes are moist.  Eyes:     Conjunctiva/sclera: Conjunctivae normal.  Cardiovascular:     Rate and Rhythm: Normal rate and regular rhythm.     Heart sounds: Normal heart sounds. No murmur heard.  No friction rub.  Pulmonary:     Effort: Pulmonary effort is normal. No respiratory distress.     Breath sounds: Normal breath sounds. No wheezing or rales.  Abdominal:     General: There is no distension.     Palpations: Abdomen is soft.     Tenderness: There is no abdominal tenderness.  Musculoskeletal:     Cervical back: Neck supple.  Skin:  General: Skin is warm.     Capillary Refill: Capillary refill takes less than 2 seconds.  Neurological:     Mental Status: She is alert and oriented to person, place, and time.     Motor: No abnormal muscle tone.       ____________________________________________    LABS (all labs ordered are listed, but only abnormal results are displayed)  Labs Reviewed  CBC WITH DIFFERENTIAL/PLATELET - Abnormal; Notable for the following components:      Result Value   WBC 12.4 (*)    Hemoglobin 11.6 (*)    HCT 35.2 (*)    Neutro Abs 10.0 (*)    Monocytes Absolute 1.1 (*)    All other components within normal limits  COMPREHENSIVE METABOLIC PANEL - Abnormal; Notable for the following components:   Sodium 131 (*)    Chloride 93 (*)    Glucose, Bld 114 (*)    BUN 36 (*)    Creatinine, Ser 1.07 (*)    Calcium 8.4 (*)    Albumin 3.2 (*)    AST 66 (*)    GFR, Estimated 48 (*)    All other components within normal limits  URINALYSIS, COMPLETE (UACMP) WITH MICROSCOPIC - Abnormal; Notable for the following components:   Color, Urine YELLOW (*)    APPearance TURBID (*)    Hgb urine dipstick LARGE (*)    Ketones, ur 5 (*)    Protein, ur 100 (*)    Leukocytes,Ua LARGE (*)    WBC, UA >50 (*)    Bacteria, UA MANY (*)    All other components within normal limits  RESPIRATORY PANEL BY RT PCR (FLU A&B, COVID)    ____________________________________________  EKG: Normal sinus rhythm, ventricular rate 79.  PR 154, QRS 93, QTc 445.  LVH by voltage.  No acute ST elevations or depressions.  No EKG evidence of acute ischemia or infarct. ________________________________________  RADIOLOGY All imaging, including plain films, CT scans, and ultrasounds, independently reviewed by me, and interpretations confirmed via formal radiology reads.  ED MD interpretation:   Chest x-ray: Clear, no focal abnormality  Official radiology report(s): DG Chest Portable 1 View  Result Date: 01/29/2020 CLINICAL DATA:  Weakness EXAM: PORTABLE CHEST 1 VIEW COMPARISON:  09/07/2018 FINDINGS: Mild cardiac enlargement without heart failure. No edema or effusion. Elevated right hemidiaphragm with mild right lower lobe atelectasis. Negative for pneumonia. IMPRESSION: No active disease.  Electronically Signed   By: Franchot Gallo M.D.   On: 01/29/2020 20:49    ____________________________________________  PROCEDURES   Procedure(s) performed (including Critical Care):  Procedures  ____________________________________________  INITIAL IMPRESSION / MDM / [place] / ED COURSE  As part of my medical decision making, I reviewed the following data within the [facility] notes reviewed and incorporated, Old chart reviewed, Notes from prior ED visits, and [place] Controlled Substance Database       *Cailah Reach Birt was evaluated in Emergency Department on 01/29/2020 for the symptoms described in the history of present illness. She was evaluated in the context of the global COVID-19 pandemic, which necessitated consideration that the patient might be at risk for infection with the SARS-CoV-2 virus that causes COVID-19. Institutional protocols and algorithms that pertain to the evaluation of patients at risk for COVID-19 are in a state of rapid change based on information released by regulatory bodies including the CDC and federal and state organizations. These policies and algorithms were followed during the patient's care in the  ED.  Some ED evaluations and interventions may be delayed as a result of limited staffing during the pandemic.*     Medical Decision Making: 86 year old female here with generalized weakness and slight suprapubic tenderness.  No rebound or guarding on abdominal exam.  She appears nontoxic clinically.  Lab work does show moderate leukocytosis as well as likely slight prerenal AKI with elevated BUN to creatinine ratio, particularly given her age and habitus.  Chest x-ray shows no evidence of pneumonia.  Urinalysis is consistent with UTI.  Will give Rocephin, fluids, and admit.  ____________________________________________  FINAL CLINICAL IMPRESSION(S) / ED DIAGNOSES  Final diagnoses:  Dehydration  Generalized weakness  Acute  cystitis with hematuria     MEDICATIONS GIVEN DURING THIS VISIT:  Medications  sodium chloride 0.9 % bolus 1,000 mL (has no administration in time range)     ED Discharge Orders    None       Note:  This document was prepared using Dragon voice recognition software and may include unintentional dictation errors.   Duffy Bruce, MD 01/29/20 2238

## 2020-01-29 NOTE — ED Notes (Signed)
REPORT CALLED TO AMBER RN ALL QUESTIONS ANSWERED , COVID SWAB IN LAB AND AWAITING RESULTS.

## 2020-01-29 NOTE — H&P (Signed)
Kristine Jacobson KJZ:[id number] DOB: April 03, 1923 DOA: 01/29/2020     PCP: No primary care provider on file.   Outpatient Specialists:  NONE    Patient arrived to ER on 01/29/20 at 2022 Referred by Attending Duffy Bruce, MD   Patient coming from: independent living [facility]   Chief Complaint:   Chief Complaint  Patient presents with  . Medical Management of Chronic Issues    PER EMS PT STOPPED WALKING , NO CO OF PAIN,  PT HAS DEMENTIA     HPI: Kristine Jacobson is a 86 y.o. female with medical history significant of dementia arthritis skin cancer likely degeneration osteoporosis small bowel obstruction 2019 chronic urinary incontinence recurrent UTIs hypertension    Presented with weak and confused for the past 3 to 4 days.  She feels that she is in a haze.  Nonspecific otherwise mild supra pubic discomfort noted no chest pain or shortness of breath no sick contacts  have been feeling bad for  While Per family patient is supposed to be on doxycycline chronically every time she comes off she gets a UTI and her symptoms with that are fatigue and trouble walking Daughter feels strongly that patient should be discharged on maintenance  doxycycline  Patient reports some leg spasms and back discomfort which he attributes to laying down in bed for long period of time Infectious risk factors:  Reports abdominal pain,   severe fatigue     Has  been vaccinated against COVID per family  got a booster recently 01/29/2020   Initial COVID TEST  in house  PCR testing  Pending  No results found for: SARSCOV2NAA   Regarding pertinent Chronic problems:      Hyperlipidemia -  No longer on statins Lipid Panel     Component Value Date/Time   CHOL 204 (H) 07/14/2018 0915   TRIG 150.0 (H) 07/14/2018 0915   HDL 49.70 07/14/2018 0915   CHOLHDL 4 07/14/2018 0915   VLDL 30.0 07/14/2018 0915   LDLCALC 124 (H) 07/14/2018 0915     HTN on Norvasc Coreg hydralazine Hyzaar   chronic CHF  diastolic  - last echo 9480 (grade 1 diastolic dysfunction).         Dementia -mild  While in ER:  Noted to have AKI and mild anemia Low sodium down to 131 Evidence of UTI  Hospitalist was called for admission for acute encephalopathy AKI and UTI  The following Work up has been ordered so far:  Orders Placed This Encounter  Procedures  . Respiratory Panel by RT PCR (Flu A&B, Covid) - Nasopharyngeal Swab  . DG Chest Portable 1 View  . CBC with Differential  . Comprehensive metabolic panel  . Urinalysis, Complete w Microscopic  . In and Out Cath  . Consult to hospitalist  ALL PATIENTS BEING ADMITTED/HAVING PROCEDURES NEED COVID-19 SCREENING  . ED EKG    Following Medications were ordered in ER: Medications  sodium chloride 0.9 % bolus 1,000 mL (has no administration in time range)        Consult Orders  (From admission, onward)         Start     Ordered   01/29/20 2229  Consult to hospitalist  ALL PATIENTS BEING ADMITTED/HAVING PROCEDURES NEED COVID-19 SCREENING  Once       Comments: ALL PATIENTS BEING ADMITTED/HAVING PROCEDURES NEED COVID-19 SCREENING  Provider:  (Not yet assigned)  Question Answer Comment  Place call to: Hospitalist   Reason for Consult  Admit      01/29/20 2228          Significant initial  Findings: Abnormal Labs Reviewed  CBC WITH DIFFERENTIAL/PLATELET - Abnormal; Notable for the following components:      Result Value   WBC 12.4 (*)    Hemoglobin 11.6 (*)    HCT 35.2 (*)    Neutro Abs 10.0 (*)    Monocytes Absolute 1.1 (*)    All other components within normal limits  COMPREHENSIVE METABOLIC PANEL - Abnormal; Notable for the following components:   Sodium 131 (*)    Chloride 93 (*)    Glucose, Bld 114 (*)    BUN 36 (*)    Creatinine, Ser 1.07 (*)    Calcium 8.4 (*)    Albumin 3.2 (*)    AST 66 (*)    GFR, Estimated 48 (*)    All other components within normal limits  URINALYSIS, COMPLETE (UACMP) WITH MICROSCOPIC - Abnormal;  Notable for the following components:   Color, Urine YELLOW (*)    APPearance TURBID (*)    Hgb urine dipstick LARGE (*)    Ketones, ur 5 (*)    Protein, ur 100 (*)    Leukocytes,Ua LARGE (*)    WBC, UA >50 (*)    Bacteria, UA MANY (*)    All other components within normal limits    Otherwise labs showing:    Recent Labs  Lab 01/29/20 2022 01/29/20 2315  NA 131*  --   K 3.8  --   CO2 27  --   GLUCOSE 114*  --   BUN 36*  --   CREATININE 1.07*  --   CALCIUM 8.4*  --   MG  --  2.4  PHOS  --  3.1    Cr Up from baseline see below Lab Results  Component Value Date   CREATININE 1.07 (H) 01/29/2020   CREATININE 0.72 03/12/2019   CREATININE 0.80 07/14/2018    Recent Labs  Lab 01/29/20 2022  AST 66*  ALT 32  ALKPHOS 78  BILITOT 1.0  PROT 7.8  ALBUMIN 3.2*   Lab Results  Component Value Date   CALCIUM 8.4 (L) 01/29/2020     WBC      Component Value Date/Time   WBC 12.4 (H) 01/29/2020 2022   LYMPHSABS 1.3 01/29/2020 2022   LYMPHSABS 0.9 09/05/2018 1041   MONOABS 1.1 (H) 01/29/2020 2022   EOSABS 0.0 01/29/2020 2022   EOSABS 0.1 09/05/2018 1041   BASOSABS 0.0 01/29/2020 2022   BASOSABS 0.0 09/05/2018 1041    Plt: Lab Results  Component Value Date   PLT 219 01/29/2020       Recent Labs    01/29/20 2257  FERRITIN 130    HG/HCT    Down   from baseline see below    Component Value Date/Time   HGB 11.6 (L) 01/29/2020 2022   HGB 11.3 09/05/2018 1041   HCT 35.2 (L) 01/29/2020 2022   HCT 34.3 09/05/2018 1041   MCV 83.6 01/29/2020 2022   MCV 87 09/05/2018 1041     Cardiac Panel (last 3 results) Recent Labs    01/29/20 2257  CKTOTAL 1,019*       ECG: Ordered Personally reviewed by me showing: HR :79 Rhythm:  NSR,  nonspecific changes,  QTC 445     UA evidence of UTI    Urine analysis:    Component Value Date/Time   COLORURINE YELLOW (A) 01/29/2020 2022  APPEARANCEUR TURBID (A) 01/29/2020 2022   APPEARANCEUR Clear 09/05/2018 1041    LABSPEC 1.018 01/29/2020 2022   PHURINE 5.0 01/29/2020 2022   GLUCOSEU NEGATIVE 01/29/2020 2022   HGBUR LARGE (A) 01/29/2020 2022   BILIRUBINUR NEGATIVE 01/29/2020 2022   BILIRUBINUR Negative 09/05/2018 1041   KETONESUR 5 (A) 01/29/2020 2022   PROTEINUR 100 (A) 01/29/2020 2022   NITRITE NEGATIVE 01/29/2020 2022   LEUKOCYTESUR LARGE (A) 01/29/2020 2022      Ordered  CT HEAD    CXR -  NON acute  CT renal protocol - pending   ED Triage Vitals [01/29/20 2028]  Enc Vitals Group     BP (!) 152/76     Pulse Rate 85     Resp 17     Temp 98.5 F (36.9 C)     Temp Source Oral     SpO2 98 %     Weight      Height      Head Circumference      Peak Flow      Pain Score 0     Pain Loc      Pain Edu?      Excl. in [place]?   EUMP(53)@       Latest  Blood pressure (!) 152/76, pulse 85, temperature 98.5 F (36.9 C), temperature source Oral, resp. rate 17, SpO2 98 %.    Review of Systems:    Pertinent positives include:  confusion fatigue,  dysuria, Constitutional:  No weight loss, night sweats, Fevers, chills, weight loss  HEENT:  No headaches, Difficulty swallowing,Tooth/dental problems,Sore throat,  No sneezing, itching, ear ache, nasal congestion, post nasal drip,  Cardio-vascular:  No chest pain, Orthopnea, PND, anasarca, dizziness, palpitations.no Bilateral lower extremity swelling  GI:  No heartburn, indigestion, abdominal pain, nausea, vomiting, diarrhea, change in bowel habits, loss of appetite, melena, blood in stool, hematemesis Resp:  no shortness of breath at rest. No dyspnea on exertion, No excess mucus, no productive cough, No non-productive cough, No coughing up of blood.No change in color of mucus.No wheezing. Skin:  no rash or lesions. No jaundice GU:  no  change in color of urine, no urgency or frequency. No straining to urinate.  No flank pain.  Musculoskeletal:  No joint pain or no joint swelling. No decreased range of motion. No back pain.  Psych:  No  change in mood or affect. No depression or anxiety. No memory loss.  Neuro: no localizing neurological complaints, no tingling, no weakness, no double vision, no gait abnormality, no slurred speech, no   All systems reviewed and apart from [place] all are negative  Past Medical History:   Past Medical History:  Diagnosis Date  . Allergy    seasonal   . Arthritis    shoulders, hands  . Cancer ([place])    [place] face-Dr. Evorn Gong   . Chronic neck pain    2/2 bulging discs   . HBP (high blood pressure)   . Hearing aid worn    bilateral  . Macular degeneration    dx'ed 2015 dry now left wet and right mild wet [facility] Dr. Michelene Heady  . Macular degeneration, age related   . Osteoporosis   . Overactive bladder   . Restless leg   . SBO (small bowel obstruction) ([place])    01/2017, 02/2018  . Spinal stenosis   . Urinary incontinence    s/p monthly PTNS with Dr. Jacqlyn Larsen, no help with overactive bladder with meds nor Botox   .  UTI (urinary tract infection)   . Vertigo    no episodes for several years  . Wears dentures    partial upper and lower       Past Surgical History:  Procedure Laterality Date  . ABDOMINAL HYSTERECTOMY     1967  . APPENDECTOMY     1967  . BLADDER SURGERY     bladder suspension; 05-14-03, 2011-Dr. Cope  . BREAST EXCISIONAL BIOPSY Right 1970   neg  . BROW LIFT Bilateral 12/01/2016   Procedure: BLEPHAROPLASTY upper eyelid with excess skin;  Surgeon: Karle Starch, MD;  Location: [facility];  Service: Ophthalmology;  Laterality: Bilateral;  MAC  . CARPAL TUNNEL RELEASE     right May 13, 2014   . Esophageal dilitation    . eyelid surgery     05/13/16  . FRACTURE SURGERY    . HIP SURGERY     left 13-May-2014   . INTRAMEDULLARY (IM) NAIL INTERTROCHANTERIC Left 02/27/2015   Procedure: INTRAMEDULLARY (IM) NAIL INTERTROCHANTRIC;  Surgeon: Dereck Leep, MD;  Location: [facility];  Service: Orthopedics;  Laterality: Left;  . PTOSIS REPAIR Bilateral 12/01/2016   Procedure: PTOSIS  REPAIR  repair resect ex;  Surgeon: Karle Starch, MD;  Location: [facility];  Service: Ophthalmology;  Laterality: Bilateral;  . TONSILLECTOMY      Social History:  Ambulatory   Independently      reports that she has never smoked. She has never used smokeless tobacco. She reports that she does not drink alcohol and does not use drugs.  Family History:   Family History  Problem Relation Age of Onset  . Diabetes Mother   . Arthritis Mother   . Stroke Mother   . Heart disease Father   . Diabetes Sister   . Cancer Brother        lung 2/2 asbestos   . Diabetes Daughter   . Cancer Daughter        DCIS breast   . Diabetes Brother   . Diabetes Brother   . Heart attack Grandson        died age 4 in  05/13/17  . Breast cancer Neg Hx     Allergies: Allergies  Allergen Reactions  . Macrobid [Nitrofurantoin Macrocrystal]     Cough   . Codeine Rash     Prior to Admission medications   Medication Sig Start Date End Date Taking? Authorizing Provider  acetaminophen (TYLENOL) 500 MG tablet Take 1,000 mg by mouth 2 (two) times daily as needed (pain).     [provider]  amLODipine (NORVASC) 5 MG tablet Take 1 tablet (5 mg total) by mouth daily. 02/24/19   McLean-Scocuzza, Nino Glow, MD  bisacodyl (DULCOLAX) 5 MG EC tablet Take 5 mg by mouth daily as needed for moderate constipation.    [provider]  Calcium Carb-Cholecalciferol (CALCIUM 600 + D PO) Take 600 mg by mouth 2 (two) times daily.    [provider]  Calcium Carbonate-Vitamin D (CALTRATE 600+D) 600-400 MG-UNIT tablet Take by mouth.    [provider]  carvedilol (COREG) 6.25 MG tablet Take 1 tablet (6.25 mg total) by mouth 2 (two) times daily with a meal. 02/24/19   McLean-Scocuzza, Nino Glow, MD  doxycycline (VIBRA-TABS) 100 MG tablet Take 1 tablet (100 mg total) by mouth daily. 02/21/19   McLean-Scocuzza, Nino Glow, MD  gabapentin (NEURONTIN) 100 MG capsule Take 2 capsules (200 mg  total) by mouth 3 (three) times daily. 02/24/19  McLean-Scocuzza, Nino Glow, MD  hydrALAZINE (APRESOLINE) 25 MG tablet Take 1 tablet (25 mg total) by mouth 2 (two) times daily. 02/24/19   McLean-Scocuzza, Nino Glow, MD  linaclotide Rolan Lipa) 290 MCG CAPS capsule Take 1 capsule (290 mcg total) by mouth daily before breakfast. 02/24/19   McLean-Scocuzza, Nino Glow, MD  loratadine (CLARITIN) 10 MG tablet Take 10 mg by mouth as needed.    [provider]  losartan-hydrochlorothiazide (HYZAAR) 100-25 MG tablet Take 1 tablet by mouth daily. 02/24/19   McLean-Scocuzza, Nino Glow, MD  lubiprostone (AMITIZA) 8 MCG capsule Take 1 capsule (8 mcg total) by mouth 2 (two) times daily with a meal. 08/08/19   Virgel Manifold, MD  Multiple Vitamin (MULTIVITAMIN WITH MINERALS) TABS tablet Take 1 tablet by mouth daily. Centrum Silver    [provider]  Multiple Vitamins-Minerals (PRESERVISION AREDS 2) CAPS Take 1 capsule by mouth 2 (two) times daily.    [provider]  Propylene Glycol (SYSTANE BALANCE OP) Place 1 drop into both eyes 3 (three) times daily as needed (dry eyes).    [provider]  rOPINIRole (REQUIP) 0.5 MG tablet TAKE 1 TABLET BY MOUTH IN THE MORNING, 1 TABLET BY MOUTH AT LUNCH, AND 1 TABLET BY MOUTH IN THE EVENING. 02/24/19   McLean-Scocuzza, Nino Glow, MD  traMADol (ULTRAM) 50 MG tablet Take 1 tablet (50 mg total) by mouth every 6 (six) hours as needed for moderate pain. Patient taking differently: Take 50 mg by mouth daily as needed for moderate pain.  09/15/15   JAARS Lions, PA-C   Physical Exam: Vitals with BMI 01/29/2020 04/05/2019 03/12/2019  Height - _0  -  Weight - 147 lbs 11 oz -  BMI - 38.46 -  Systolic [phone]  Diastolic 76 60 70  Pulse 85 - 75    1. General:  in No  Acute distress   Chronically ill  -appearing 2. Psychological: Alert and   Oriented to self and situation 3. Head/ENT:    Dry Mucous Membranes                           Head Non traumatic, neck supple                          Poor Dentition 4. SKIN:   decreased Skin turgor,  Skin clean Dry and intact no rash 5. Heart: Regular rate and rhythm no  Murmur, no Rub or gallop 6. Lungs:  no wheezes or crackles   7. Abdomen: Soft,   non-tender, Non distended  Obese bowel sounds present 8. Lower extremities: no clubbing, cyanosis, no edema 9. Neurologically Grossly intact, moving all 4 extremities equally   10. MSK: Normal range of motion diminished in right leg where she is having a significant spasm   All other LABS:     Recent Labs  Lab 01/29/20 2022  WBC 12.4*  NEUTROABS 10.0*  HGB 11.6*  HCT 35.2*  MCV 83.6  PLT 219     Recent Labs  Lab 01/29/20 2022  NA 131*  K 3.8  CL 93*  CO2 27  GLUCOSE 114*  BUN 36*  CREATININE 1.07*  CALCIUM 8.4*     Recent Labs  Lab 01/29/20 2022  AST 66*  ALT 32  ALKPHOS 78  BILITOT 1.0  PROT 7.8  ALBUMIN 3.2*      Cultures:    Component Value  Date/Time   SDES URINE, CLEAN CATCH 06/29/2015 1235   SPECREQUEST NONE 06/29/2015 1235   CULT 3,000 COLONIES/mL INSIGNIFICANT GROWTH (A) 06/29/2015 1235   REPTSTATUS 07/01/2015 FINAL 06/29/2015 1235     Radiological Exams on Admission: DG Chest Portable 1 View  Result Date: 01/29/2020 CLINICAL DATA:  Weakness EXAM: PORTABLE CHEST 1 VIEW COMPARISON:  09/07/2018 FINDINGS: Mild cardiac enlargement without heart failure. No edema or effusion. Elevated right hemidiaphragm with mild right lower lobe atelectasis. Negative for pneumonia. IMPRESSION: No active disease. Electronically Signed   By: Franchot Gallo M.D.   On: 01/29/2020 20:49    Chart has been reviewed    Assessment/Plan  86 y.o. female with medical history significant of dementia arthritis skin cancer likely degeneration osteoporosis small bowel obstruction 2019 chronic urinary incontinence recurrent UTIs hypertension  Admitted for UTI, AMS, anemia  Present on Admission: . AKI (acute kidney  injury) ([place]) -in the setting of dehydration and rhabdomyolysis will rehydrate follow kidney function and follow CK  . Rhabdomyolysis -follow CK rehydrate Unclear etiology But muscle spasms and pain may explain with patient having hard time walking  . Acute lower UTI -patient has chronic UTI but appears to have more of an acute exacerbation. For now treat with IV antibiotics await results urine culture Family reports that she does better if she is on chronic doxycycline  . Restless leg syndrome restart home medications   . Low back pain -chronic reproducible by palpation in the large muscle groups suspect most likely related to rhabdo versus positional secondary to laying down in bed does not appear to have costovertebral tenderness but given UTI will obtain renal CT to rule out pyelonephritis/perinephric abscess Patient had recent COVID vaccination booster it is possible some of her cysts symptoms are explained with postvaccination reaction  Acute encephalopathy -mild most likely in the setting of UTI dehydration CK elevation. Patient does have mild dementia at baseline but usually does fairly well will monitor for any sundowning while hospitalized  . Hypertension -restart home medications and monitor  . Iron deficiency anemia -patient denies any blood in stool.  Obtain Hemoccult Suspect hemoglobin will drop after fluid resuscitation Would need to do have discussion regarding if she would like to have further investigations In which case would benefit from GI consult and/or GI follow-up as an outpatient Other plan as per orders.  DVT prophylaxis:  SCD       Code Status:    Code Status: Prior  DNR/DNI  as per patient   I had personally discussed CODE STATUS with patient     Family Communication:   Family not at  Bedside  plan of care was discussed on the phone with  Daughter,  Disposition Plan:                              Back to current facility when stable                              Following barriers for discharge:                            Electrolytes corrected                               Anemia stable  able to transition to PO antibiotics                             Will need to be able to tolerate PO                                                  Would benefit from PT/OT eval prior to DC  Ordered                                      Transition of care consulted                   Consults called: none  Admission status:  ED Disposition    ED Disposition Condition [place]: [facility] [100120]  Level of Care: Med-Surg [16]  Covid Evaluation: Asymptomatic Screening Protocol (No Symptoms)  Diagnosis: AKI (acute kidney injury) [facility]) [505697]  Admitting Physician: Toy Baker [[id number]]  Attending Physician: Toy Baker [[id number]]       Obs   Level of care    tele  For 12H    Lab Results  Component Value Date   [place] 01/29/2020     Precautions:   No active isolations    PPE: Used by the provider:   P100  eye Goggles,  Gloves    Rayansh Herbst 01/30/2020, 12:44 AM    [facility]     after 2 AM please page floor coverage PA If 7AM-7PM, please contact the day team taking care of the patient using [email or web address]   Patient was evaluated in the context of the global COVID-19 pandemic, which necessitated consideration that the patient might be at risk for infection with the SARS-CoV-2 virus that causes COVID-19. Institutional protocols and algorithms that pertain to the evaluation of patients at risk for COVID-19 are in a state of rapid change based on information released by regulatory bodies including the CDC and federal and state organizations. These policies and algorithms were followed during the patient's care.

## 2020-01-30 ENCOUNTER — Observation Stay: Payer: Medicare Other

## 2020-01-30 ENCOUNTER — Other Ambulatory Visit: Payer: Self-pay

## 2020-01-30 DIAGNOSIS — I110 Hypertensive heart disease with heart failure: Secondary | ICD-10-CM | POA: Diagnosis not present

## 2020-01-30 DIAGNOSIS — E785 Hyperlipidemia, unspecified: Secondary | ICD-10-CM | POA: Diagnosis not present

## 2020-01-30 DIAGNOSIS — R32 Unspecified urinary incontinence: Secondary | ICD-10-CM | POA: Diagnosis not present

## 2020-01-30 DIAGNOSIS — N390 Urinary tract infection, site not specified: Secondary | ICD-10-CM | POA: Diagnosis present

## 2020-01-30 DIAGNOSIS — Z20822 Contact with and (suspected) exposure to covid-19: Secondary | ICD-10-CM | POA: Diagnosis not present

## 2020-01-30 DIAGNOSIS — D509 Iron deficiency anemia, unspecified: Secondary | ICD-10-CM | POA: Diagnosis not present

## 2020-01-30 DIAGNOSIS — M545 Low back pain, unspecified: Secondary | ICD-10-CM | POA: Diagnosis not present

## 2020-01-30 DIAGNOSIS — F0390 Unspecified dementia without behavioral disturbance: Secondary | ICD-10-CM | POA: Diagnosis not present

## 2020-01-30 DIAGNOSIS — K8020 Calculus of gallbladder without cholecystitis without obstruction: Secondary | ICD-10-CM | POA: Diagnosis not present

## 2020-01-30 DIAGNOSIS — C7802 Secondary malignant neoplasm of left lung: Secondary | ICD-10-CM | POA: Diagnosis not present

## 2020-01-30 DIAGNOSIS — J9811 Atelectasis: Secondary | ICD-10-CM | POA: Diagnosis not present

## 2020-01-30 DIAGNOSIS — G9341 Metabolic encephalopathy: Secondary | ICD-10-CM | POA: Diagnosis not present

## 2020-01-30 DIAGNOSIS — Z66 Do not resuscitate: Secondary | ICD-10-CM | POA: Diagnosis not present

## 2020-01-30 DIAGNOSIS — D490 Neoplasm of unspecified behavior of digestive system: Secondary | ICD-10-CM

## 2020-01-30 DIAGNOSIS — N3001 Acute cystitis with hematuria: Secondary | ICD-10-CM | POA: Diagnosis not present

## 2020-01-30 DIAGNOSIS — E871 Hypo-osmolality and hyponatremia: Secondary | ICD-10-CM | POA: Diagnosis not present

## 2020-01-30 DIAGNOSIS — M6282 Rhabdomyolysis: Secondary | ICD-10-CM | POA: Diagnosis present

## 2020-01-30 DIAGNOSIS — C179 Malignant neoplasm of small intestine, unspecified: Secondary | ICD-10-CM | POA: Diagnosis not present

## 2020-01-30 DIAGNOSIS — M810 Age-related osteoporosis without current pathological fracture: Secondary | ICD-10-CM | POA: Diagnosis not present

## 2020-01-30 DIAGNOSIS — Z85828 Personal history of other malignant neoplasm of skin: Secondary | ICD-10-CM | POA: Diagnosis not present

## 2020-01-30 DIAGNOSIS — N201 Calculus of ureter: Secondary | ICD-10-CM | POA: Diagnosis not present

## 2020-01-30 DIAGNOSIS — N179 Acute kidney failure, unspecified: Secondary | ICD-10-CM | POA: Diagnosis not present

## 2020-01-30 DIAGNOSIS — G2581 Restless legs syndrome: Secondary | ICD-10-CM | POA: Diagnosis not present

## 2020-01-30 DIAGNOSIS — E860 Dehydration: Secondary | ICD-10-CM | POA: Diagnosis not present

## 2020-01-30 DIAGNOSIS — R269 Unspecified abnormalities of gait and mobility: Secondary | ICD-10-CM | POA: Diagnosis not present

## 2020-01-30 DIAGNOSIS — G8929 Other chronic pain: Secondary | ICD-10-CM | POA: Diagnosis not present

## 2020-01-30 DIAGNOSIS — I5032 Chronic diastolic (congestive) heart failure: Secondary | ICD-10-CM | POA: Diagnosis not present

## 2020-01-30 LAB — CBC WITH DIFFERENTIAL/PLATELET
Abs Immature Granulocytes: 0.03 10*3/uL (ref 0.00–0.07)
Basophils Absolute: 0 10*3/uL (ref 0.0–0.1)
Basophils Relative: 0 %
Eosinophils Absolute: 0 10*3/uL (ref 0.0–0.5)
Eosinophils Relative: 0 %
HCT: 34.7 % — ABNORMAL LOW (ref 36.0–46.0)
Hemoglobin: 11.8 g/dL — ABNORMAL LOW (ref 12.0–15.0)
Immature Granulocytes: 0 %
Lymphocytes Relative: 16 %
Lymphs Abs: 1.7 10*3/uL (ref 0.7–4.0)
MCH: 27.8 pg (ref 26.0–34.0)
MCHC: 34 g/dL (ref 30.0–36.0)
MCV: 81.8 fL (ref 80.0–100.0)
Monocytes Absolute: 1 10*3/uL (ref 0.1–1.0)
Monocytes Relative: 9 %
Neutro Abs: 7.6 10*3/uL (ref 1.7–7.7)
Neutrophils Relative %: 75 %
Platelets: 217 10*3/uL (ref 150–400)
RBC: 4.24 MIL/uL (ref 3.87–5.11)
RDW: 13.9 % (ref 11.5–15.5)
WBC: 10.3 10*3/uL (ref 4.0–10.5)
nRBC: 0 % (ref 0.0–0.2)

## 2020-01-30 LAB — MAGNESIUM: Magnesium: 2.3 mg/dL (ref 1.7–2.4)

## 2020-01-30 LAB — PHOSPHORUS: Phosphorus: 2.8 mg/dL (ref 2.5–4.6)

## 2020-01-30 LAB — COMPREHENSIVE METABOLIC PANEL
ALT: 35 U/L (ref 0–44)
AST: 67 U/L — ABNORMAL HIGH (ref 15–41)
Albumin: 3.3 g/dL — ABNORMAL LOW (ref 3.5–5.0)
Alkaline Phosphatase: 75 U/L (ref 38–126)
Anion gap: 11 (ref 5–15)
BUN: 34 mg/dL — ABNORMAL HIGH (ref 8–23)
CO2: 24 mmol/L (ref 22–32)
Calcium: 8.3 mg/dL — ABNORMAL LOW (ref 8.9–10.3)
Chloride: 96 mmol/L — ABNORMAL LOW (ref 98–111)
Creatinine, Ser: 0.88 mg/dL (ref 0.44–1.00)
GFR, Estimated: >60 mL/min (ref >60)
Glucose, Bld: 101 mg/dL — ABNORMAL HIGH (ref 70–99)
Potassium: 3.7 mmol/L (ref 3.5–5.1)
Sodium: 131 mmol/L — ABNORMAL LOW (ref 135–145)
Total Bilirubin: 0.9 mg/dL (ref 0.3–1.2)
Total Protein: 7.4 g/dL (ref 6.5–8.1)

## 2020-01-30 LAB — SODIUM, URINE, RANDOM: Sodium, Ur: 22 mmol/L

## 2020-01-30 LAB — RETICULOCYTES
Immature Retic Fract: 13.2 % (ref 2.3–15.9)
RBC.: 4.24 MIL/uL (ref 3.87–5.11)
Retic Count, Absolute: 68.7 10*3/uL (ref 19.0–186.0)
Retic Ct Pct: 1.6 % (ref 0.4–3.1)

## 2020-01-30 LAB — IRON AND TIBC
Iron: 27 ug/dL — ABNORMAL LOW (ref 28–170)
Saturation Ratios: 10 % — ABNORMAL LOW (ref 10.4–31.8)
TIBC: 276 ug/dL (ref 250–450)
UIBC: 249 ug/dL

## 2020-01-30 LAB — RESPIRATORY PANEL BY RT PCR (FLU A&B, COVID)
Influenza A by PCR: NEGATIVE
Influenza B by PCR: NEGATIVE
SARS Coronavirus 2 by RT PCR: NEGATIVE

## 2020-01-30 LAB — LACTIC ACID, PLASMA: Lactic Acid, Venous: 1.3 mmol/L (ref 0.5–1.9)

## 2020-01-30 LAB — VITAMIN B12: Vitamin B-12: 1196 pg/mL — ABNORMAL HIGH (ref 180–914)

## 2020-01-30 LAB — FOLATE: Folate: 18.8 ng/mL (ref >5.9)

## 2020-01-30 LAB — CK
Total CK: 1019 U/L — ABNORMAL HIGH (ref 38–234)
Total CK: 890 U/L — ABNORMAL HIGH (ref 38–234)

## 2020-01-30 LAB — OSMOLALITY, URINE: Osmolality, Ur: 570 mOsm/kg (ref 300–900)

## 2020-01-30 LAB — OSMOLALITY: Osmolality: 285 mOsm/kg (ref 275–295)

## 2020-01-30 LAB — TSH: TSH: 2.389 u[IU]/mL (ref 0.350–4.500)

## 2020-01-30 LAB — FERRITIN: Ferritin: 130 ng/mL (ref 11–307)

## 2020-01-30 LAB — CREATININE, URINE, RANDOM: Creatinine, Urine: 141 mg/dL

## 2020-01-30 MED ORDER — TRAMADOL HCL 50 MG PO TABS
50.0000 mg | ORAL_TABLET | Freq: Every day | ORAL | Status: DC | PRN
  Administered 2020-01-30 – 2020-01-31 (×2): 50 mg via ORAL
  Filled 2020-01-30 (×2): qty 1

## 2020-01-30 MED ORDER — BISACODYL 5 MG PO TBEC: 5.0000 mg | DELAYED_RELEASE_TABLET | Freq: Every day | ORAL | Status: DC | PRN

## 2020-01-30 MED ORDER — LINACLOTIDE 290 MCG PO CAPS
290.0000 ug | ORAL_CAPSULE | Freq: Every day | ORAL | Status: DC
  Administered 2020-01-31: 290 ug via ORAL
  Filled 2020-01-30 (×2): qty 1

## 2020-01-30 MED ORDER — HYDROMORPHONE HCL 1 MG/ML IJ SOLN
0.5000 mg | Freq: Once | INTRAMUSCULAR | Status: AC
  Administered 2020-01-30: 07:00:00 0.5 mg via INTRAVENOUS

## 2020-01-30 MED ORDER — CARVEDILOL 3.125 MG PO TABS
6.2500 mg | ORAL_TABLET | Freq: Two times a day (BID) | ORAL | Status: DC
  Administered 2020-01-30 – 2020-01-31 (×3): 6.25 mg via ORAL
  Filled 2020-01-30 (×3): qty 2

## 2020-01-30 MED ORDER — SODIUM CHLORIDE 0.9 % IV SOLN
75.0000 mL/h | INTRAVENOUS | Status: DC
  Administered 2020-01-30 (×3): 75 mL/h via INTRAVENOUS

## 2020-01-30 MED ORDER — SODIUM CHLORIDE 0.9 % IV SOLN: INTRAVENOUS | Status: DC

## 2020-01-30 MED ORDER — METHOCARBAMOL 500 MG PO TABS
500.0000 mg | ORAL_TABLET | Freq: Three times a day (TID) | ORAL | Status: DC | PRN
  Administered 2020-01-30: 500 mg via ORAL
  Filled 2020-01-30 (×2): qty 1

## 2020-01-30 MED ORDER — HYDROMORPHONE HCL 1 MG/ML IJ SOLN
INTRAMUSCULAR | Status: AC
  Filled 2020-01-30: qty 1

## 2020-01-30 MED ORDER — GABAPENTIN 100 MG PO CAPS
200.0000 mg | ORAL_CAPSULE | Freq: Three times a day (TID) | ORAL | Status: DC
  Administered 2020-01-30 – 2020-01-31 (×2): 200 mg via ORAL
  Filled 2020-01-30 (×2): qty 2

## 2020-01-30 MED ORDER — ACETAMINOPHEN 325 MG PO TABS
650.0000 mg | ORAL_TABLET | Freq: Four times a day (QID) | ORAL | Status: DC | PRN
  Administered 2020-01-30: 650 mg via ORAL
  Filled 2020-01-30: qty 2

## 2020-01-30 MED ORDER — ROPINIROLE HCL 1 MG PO TABS
0.5000 mg | ORAL_TABLET | Freq: Three times a day (TID) | ORAL | Status: DC
  Administered 2020-01-30 – 2020-01-31 (×5): 0.5 mg via ORAL
  Filled 2020-01-30 (×5): qty 1

## 2020-01-30 MED ORDER — ACETAMINOPHEN 650 MG RE SUPP: 650.0000 mg | Freq: Four times a day (QID) | RECTAL | Status: DC | PRN

## 2020-01-30 MED ORDER — SODIUM CHLORIDE 0.9 % IV SOLN
75.0000 mL/h | INTRAVENOUS | Status: DC
  Administered 2020-01-30: 75 mL/h via INTRAVENOUS

## 2020-01-30 MED ORDER — ENOXAPARIN SODIUM 40 MG/0.4ML ~~LOC~~ SOLN
40.0000 mg | SUBCUTANEOUS | Status: DC
  Administered 2020-01-30: 22:00:00 40 mg via SUBCUTANEOUS
  Filled 2020-01-30: qty 0.4

## 2020-01-30 MED ORDER — AMLODIPINE BESYLATE 5 MG PO TABS
5.0000 mg | ORAL_TABLET | Freq: Every day | ORAL | Status: DC
  Administered 2020-01-30 – 2020-01-31 (×2): 5 mg via ORAL
  Filled 2020-01-30 (×2): qty 1

## 2020-01-30 NOTE — Consult Note (Addendum)
Urology Consult  I have been asked to see the patient by Dr. Horris Latino, for evaluation and management of an infected right ureteral stone.  Chief Complaint: Weakness  History of Present Illness: Kristine Jacobson is a 86 y.o. year old female with PMH OAB and recurrent UTI who presented to the ED overnight with reports of a 3 to 4-day history of progressive generalized weakness.  Admission labs notable for WBC count 12.4; creatinine 1.07 (baseline 0.7-0.8); UA with 21-50 RBCs/hpf, >50 WBCs/hpf, many bacteria, and WBC clumps; COVID negative; lactate 1.3.  She is afebrile, VSS.  Urine culture pending, on antibiotics as below.  CT stone study revealed a 2 mm mid right ureteral stone with asymmetric enlargement and perinephric stranding about the right kidney and mild distention of the right extrarenal pelvis concerning for inflammation versus ascending UTI.  Additionally, she was found to have findings concerning for small bowel malignancy and a 4 mm nodule in the left lung base concerning for possible metastatic disease.  Today she denies pain or dysuria.  She has a history of recurrent UTI, previously on doxycycline prophylaxis, managed at [facility].  She is not been seen by [facility] in over a year.  She denies a history of nephrolithiasis.  She states she has not been on doxycycline for "quite a while."  She affirms her DNR status and states she typically shares medical decision-making with her daughter.  Per the patient's daughter, patient was on suppressive doxycycline for approximately 5 years, having stopped this around January 2021.  Daughter is concerned that she has had multiple urinary tract infections since requiring several hospitalizations.  Anti-infectives (From admission, onward)   Start     Dose/Rate Route Frequency Ordered Stop   01/30/20 2300  cefTRIAXone (ROCEPHIN) 1 g in sodium chloride 0.9 % 100 mL IVPB        1 g 200 mL/hr over 30 Minutes Intravenous Every 24 hours 01/29/20 2333      01/29/20 2300  cefTRIAXone (ROCEPHIN) 1 g in sodium chloride 0.9 % 100 mL IVPB        1 g 200 mL/hr over 30 Minutes Intravenous  Once 01/29/20 2258 01/30/20 0036     Past Medical History:  Diagnosis Date  . Allergy    seasonal   . Arthritis    shoulders, hands  . Cancer ([place])    [place] face-Dr. Evorn Gong   . Chronic neck pain    2/2 bulging discs   . HBP (high blood pressure)   . Hearing aid worn    bilateral  . Macular degeneration    dx'ed 2015 dry now left wet and right mild wet [facility] Dr. Michelene Heady  . Macular degeneration, age related   . Osteoporosis   . Overactive bladder   . Restless leg   . SBO (small bowel obstruction) ([place])    01/2017, 02/2018  . Spinal stenosis   . Urinary incontinence    s/p monthly PTNS with Dr. Jacqlyn Larsen, no help with overactive bladder with meds nor Botox   . UTI (urinary tract infection)   . Vertigo    no episodes for several years  . Wears dentures    partial upper and lower    Past Surgical History:  Procedure Laterality Date  . ABDOMINAL HYSTERECTOMY     1967  . APPENDECTOMY     1967  . BLADDER SURGERY     bladder suspension; 2005, 2011-Dr. Cope  . BREAST EXCISIONAL BIOPSY Right 1970   neg  .  BROW LIFT Bilateral 12/01/2016   Procedure: BLEPHAROPLASTY upper eyelid with excess skin;  Surgeon: Karle Starch, MD;  Location: [facility];  Service: Ophthalmology;  Laterality: Bilateral;  MAC  . CARPAL TUNNEL RELEASE     right 05-16-14   . Esophageal dilitation    . eyelid surgery     16-May-2016  . FRACTURE SURGERY    . HIP SURGERY     left May 16, 2014   . INTRAMEDULLARY (IM) NAIL INTERTROCHANTERIC Left 02/27/2015   Procedure: INTRAMEDULLARY (IM) NAIL INTERTROCHANTRIC;  Surgeon: Dereck Leep, MD;  Location: [facility];  Service: Orthopedics;  Laterality: Left;  . PTOSIS REPAIR Bilateral 12/01/2016   Procedure: PTOSIS REPAIR  repair resect ex;  Surgeon: Karle Starch, MD;  Location: [facility];  Service: Ophthalmology;  Laterality:  Bilateral;  . TONSILLECTOMY      Home Medications:  No outpatient medications have been marked as taking for the 01/29/20 encounter [facility] Encounter).    Allergies:  Allergies  Allergen Reactions  . Macrobid [Nitrofurantoin Macrocrystal]     Cough   . Codeine Rash    Family History  Problem Relation Age of Onset  . Diabetes Mother   . Arthritis Mother   . Stroke Mother   . Heart disease Father   . Diabetes Sister   . Cancer Brother        lung 2/2 asbestos   . Diabetes Daughter   . Cancer Daughter        DCIS breast   . Diabetes Brother   . Diabetes Brother   . Heart attack Grandson        died age 58 in  05/16/17  . Breast cancer Neg Hx     Social History:  reports that she has never smoked. She has never used smokeless tobacco. She reports that she does not drink alcohol and does not use drugs.  ROS: A complete review of systems was performed.  All systems are negative except for pertinent findings as noted.  Physical Exam:  Vital signs in last 24 hours: Temp:  [97.8 F (36.6 C)-98.5 F (36.9 C)] 98.5 F (36.9 C) (11/16 0812) Pulse Rate:  [69-88] 82 (11/16 0812) Resp:  [17-20] 20 (11/16 0812) BP: (138-161)/(64-76) 141/64 (11/16 0812) SpO2:  [96 %-98 %] 97 % (11/16 0812) Constitutional:  Alert, no acute distress HEENT: [place] AT, moist mucus membrane Cardiovascular: No clubbing, cyanosis, or edema Respiratory: Normal respiratory effort Skin: No rashes, bruises or suspicious lesions Neurologic: Grossly intact, no focal deficits, moving all 4 extremities Psychiatric: Normal mood and affect  Laboratory Data:  Recent Labs    01/29/20 2020-05-16 01/30/20 0505  WBC 12.4* 10.3  HGB 11.6* 11.8*  HCT 35.2* 34.7*   Recent Labs    01/29/20 2022 01/30/20 0505  NA 131* 131*  K 3.8 3.7  CL 93* 96*  CO2 27 24  GLUCOSE 114* 101*  BUN 36* 34*  CREATININE 1.07* 0.88  CALCIUM 8.4* 8.3*   Urinalysis    Component Value Date/Time   COLORURINE YELLOW (A) 01/29/2020  05/16/20   APPEARANCEUR TURBID (A) 01/29/2020 May 16, 2020   APPEARANCEUR Clear 09/05/2018 1041   LABSPEC 1.018 01/29/2020 May 16, 2020   PHURINE 5.0 01/29/2020 2020-05-16   GLUCOSEU NEGATIVE 01/29/2020 16-May-2020   HGBUR LARGE (A) 01/29/2020 16-May-2020   BILIRUBINUR NEGATIVE 01/29/2020 2020/05/16   BILIRUBINUR Negative 09/05/2018 1041   KETONESUR 5 (A) 01/29/2020 May 16, 2020   PROTEINUR 100 (A) 01/29/2020 16-May-2020   NITRITE NEGATIVE 01/29/2020 2020-05-16   LEUKOCYTESUR  LARGE (A) 01/29/2020 2022   Results for orders placed or performed during the hospital encounter of 01/29/20  Respiratory Panel by RT PCR (Flu A&B, Covid) - Nasopharyngeal Swab     Status: None   Collection Time: 01/29/20 10:19 PM   Specimen: Nasopharyngeal Swab  Result Value Ref Range Status   SARS Coronavirus 2 by RT PCR NEGATIVE NEGATIVE Final    Comment: (NOTE) SARS-CoV-2 target nucleic acids are NOT DETECTED.  The SARS-CoV-2 RNA is generally detectable in upper respiratoy specimens during the acute phase of infection. The lowest concentration of SARS-CoV-2 viral copies this assay can detect is 131 copies/mL. A negative result does not preclude SARS-Cov-2 infection and should not be used as the sole basis for treatment or other patient management decisions. A negative result may occur with  improper specimen collection/handling, submission of specimen other than nasopharyngeal swab, presence of viral mutation(s) within the areas targeted by this assay, and inadequate number of viral copies (<131 copies/mL). A negative result must be combined with clinical observations, patient history, and epidemiological information. The expected result is Negative.  Fact Sheet for Patients:  PinkCheek.be  Fact Sheet for Healthcare Providers:  GravelBags.it  This test is no t yet approved or cleared by the [place] FDA and  has been authorized for detection and/or diagnosis of SARS-CoV-2 by FDA under an Emergency Use  Authorization (EUA). This EUA will remain  in effect (meaning this test can be used) for the duration of the COVID-19 declaration under Section 564(b)(1) of the Act, 21 U.S.C. section 360bbb-3(b)(1), unless the authorization is terminated or revoked sooner.     Influenza A by PCR NEGATIVE NEGATIVE Final   Influenza B by PCR NEGATIVE NEGATIVE Final    Comment: (NOTE) The Xpert Xpress SARS-CoV-2/FLU/RSV assay is intended as an aid in  the diagnosis of influenza from Nasopharyngeal swab specimens and  should not be used as a sole basis for treatment. Nasal washings and  aspirates are unacceptable for Xpert Xpress SARS-CoV-2/FLU/RSV  testing.  Fact Sheet for Patients: PinkCheek.be  Fact Sheet for Healthcare Providers: GravelBags.it  This test is not yet approved or cleared by the [place] FDA and  has been authorized for detection and/or diagnosis of SARS-CoV-2 by  FDA under an Emergency Use Authorization (EUA). This EUA will remain  in effect (meaning this test can be used) for the duration of the  Covid-19 declaration under Section 564(b)(1) of the Act, 21  U.S.C. section 360bbb-3(b)(1), unless the authorization is  terminated or revoked. Performed at [facility], [address] [place], [place] [address]    Radiologic Imaging: CT RENAL STONE STUDY  Addendum Date: 01/30/2020   ADDENDUM REPORT: 01/30/2020 01:31 ADDENDUM: These results were called by telephone at the time of addendum submission on 01/30/2020 at 1:31 am to provider Ouma NP, who verbally acknowledged these results. Electronically Signed   By: Lovena Le M.D.   On: 01/30/2020 01:31   Result Date: 01/30/2020 CLINICAL DATA:  Pyonephrosis EXAM: CT ABDOMEN AND PELVIS WITHOUT CONTRAST TECHNIQUE: Multidetector CT imaging of the abdomen and pelvis was performed following the standard protocol without IV contrast. COMPARISON:  MR enterography  05/13/2018 FINDINGS: Lower chest: 4 mm nodule seen in the left lung base (4/6). Some bandlike areas of atelectasis and/or scarring present in the lung bases as well. Lung bases are otherwise clear. Cardiac size is within normal limits. Small amount of gas in the pulmonary trunk, possibly related to intravenous access. Three-vessel coronary artery atherosclerosis noted.  No pericardial effusion. Hepatobiliary: Partial right diaphragmatic eventration with protrusion of the dome of the liver and a cottage low sign as seen on sagittal reconstructions (6/53). Normal hepatic attenuation. Subcentimeter hypoattenuating focus in the left lobe adjacent the gallbladder fossa (2/22) too small to fully characterize on CT imaging but statistically likely benign. No visible worrisome liver lesion. Smooth liver surface contour. Few calcified gallstones layer towards the neck of the gallbladder without biliary ductal dilatation, gallbladder wall thickening or pericholecystic inflammation. Pancreas: Partial fatty replacement of the pancreas. No pancreatic ductal dilatation or surrounding inflammatory changes. Spleen: Normal in size. No concerning splenic lesions. Adrenals/Urinary Tract: Normal adrenal glands. No visible or contour deforming renal lesions. Asymmetric enlargement and perinephric stranding about the right kidney with some mild distension of a right extrarenal pelvis and associated urothelial thickening. A punctate 2 mm calculus is seen in the mid right ureter (2/56). No left urinary tract dilatation or other visible urolithiasis. Urinary bladder is largely decompressed at the time of exam and therefore poorly evaluated by CT imaging. Cervical bladder wall thickening and perivesicular hazy stranding is noted. Stomach/Bowel: Distal esophagus, stomach and duodenal sweep are unremarkable. There is a focally thickened segment of distal small bowel (2/70, 5/60, 6/93). The markedly thickened wall demonstrates some contiguous  soft tissue extension along the more proximal mesenteric leaflets as well with adjacent irregular, enlarged mesenteric lymph nodes. No evidence of a resulting obstruction. Appendix appears to be surgically absent compatible with patient's chart history. No colonic dilatation or wall thickening. Scattered colonic diverticula without focal inflammation to suggest diverticulitis. Asymmetric rectal wall thickening is noted (2/86) though this may be related to marked pelvic floor laxity and possible rectocele. Vascular/Lymphatic: Irregular, enlarged mesenteric adenopathy is seen adjacent the exoenteric mass of the distal small bowel including a 12 mm mesenteric lymph node (2/58). Additional upper abdominal and retroperitoneal adenopathy is more nonspecific including a 10 mm right periaortic lymph node (2/40) given the inflammatory changes in the upper abdomen. Reproductive: Uterus is surgically absent. No concerning adnexal lesions. Other: Asymmetric stranding and edematous changes over the left iliac crest and left hip. More diffuse mild body wall edema. No abdominopelvic free air or fluid. No bowel containing hernias. Musculoskeletal: Significant laxity of the pelvic floor, as described above. The osseous structures appear diffusely demineralized which may limit detection of small or nondisplaced fractures. Multilevel degenerative changes are present in the imaged portions of the spine. Stepwise retrolisthesis T12-L4. 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 Age indeterminate bilateral sacral insufficiency fractures are noted. Postsurgical changes from prior left femoral intramedullary nail and transcervical pinning with heterotopic ossification about the left hip. Left femoral head articular surface collapse likely secondary to severe osteoarthrosis and geode formation though some underlying osteonecrosis is not fully excluded. IMPRESSION: 1. Asymmetric enlargement and  perinephric stranding about the right kidney with some mild distension of a right extrarenal pelvis and associated urothelial thickening. A punctate 2 mm calculus is seen in the mid right ureter. Findings are consistent with at least partially obstructive urolithiasis though a superimposed ascending tract infection is suspected as well given the extent of inflammatory findings about the ureter, bladder and kidney. 2. Circumferential thickening of the distal small bowel with exoenteric masslike extension into the mesentery and irregular adjacent mesenteric adenopathy without resulting obstruction. Findings are highly concerning for a small bowel malignancy, suspect small bowel. 3. Additional upper abdominal and retroperitoneal adenopathy is more nonspecific given the inflammatory changes in the upper abdomen. 4. Asymmetric rectal  wall thickening is noted though this may be related to marked pelvic floor laxity and possible rectocele. Recommend correlation with direct visualization. 5. Age indeterminate bilateral sacral insufficiency fractures. 6. Left femoral head articular surface collapse likely secondary to severe osteoarthrosis and geode formation though some underlying osteonecrosis is not fully excluded. 7. 4 mm nodule in the left lung base. Given findings in the abdomen, metastatic disease is not fully excluded. 8. Cholelithiasis without evidence of acute cholecystitis. 9. Cardiomegaly and Three-vessel coronary artery atherosclerosis. Currently attempting to contact the ordering provider with a critical value result. Addendum will be submitted upon case discussion. Electronically Signed: By: Lovena Le M.D. On: 01/30/2020 01:13   Assessment & Plan:  86 year old female admitted with a 3 to 4-day history of progressive weakness and found to have an infected 2 mm mid right ureteral stone as well as incidental findings concerning for small bowel malignancy with likely metastasis to the lung.  In the setting  of infected ureteral stone, would typically recommend right ureteral stent placement for source control of urinary infection with plans for 2 weeks of culture appropriate antibiotics and follow-up ureteroscopy with laser lithotripsy and stent exchange in 2 to 3 weeks. However, given the patient's comorbidities, DNR status, incidental findings on CT, diminutive stone size, and stable clinical picture, may consider conservative management with empiric antibiotics as an alternative. I am hesitant to recommend Flomax for MET in the setting of her severe osteoporosis with concern for elevated fracture risk.  Dr. Erlene Quan and I spoke with the patient and her daughter both via telephone and in person today.  We explained the treatment options at this point include right ureteral stent placement with plans for staged ureteroscopy versus conservative management with antibiotics and close monitoring with stent placement deferred pending acute decompensation. I explained that the risk of deferring ureteral stenting includes worsening of urinary infection leading to sepsis, which would likely cause death.  Ultimately, patient and daughter would like to proceed with conservative management and defer ureteral stent placement.  They may ultimately decide to defer stent placement even in the setting of acute decompensation.  Recommendations: -Advance diet -Continue empiric antibiotics and supportive care and follow urine cultures with plans for 14 days of culture appropriate therapy -Contact urology immediately with acute decompensation -Outpatient follow-up with me in 4 weeks with renal ultrasound prior to evaluate for residual right hydronephrosis.  We will also plan for cath UA at that visit to evaluate for recurrent UTI versus urinary colonization.  Patient may ultimately benefit from recurrent UTI regimen including topical vaginal estrogen cream, cranberry supplements, and daily probiotics.  Thank you for involving me  in this patient's care, I will continue to follow along.  Debroah Loop, PA-C 01/30/2020 8:41 AM

## 2020-01-30 NOTE — TOC Initial Note (Signed)
Transition of Care [facility] [address]) - Initial/Assessment Note    Patient Details  Name: Kristine Jacobson MRN: [id number] Date of Birth: 05-03-1923  Transition of [facility] CM/SW Contact:    Shelbie Hutching, RN Phone Number: 01/30/2020, 3:53 PM  Clinical Narrative:                 Patient admitted to the hospital with dehydration and acute cystitis.  Patient is from [facility] assisted living facility.  Patient's daughter, Kristine Jacobson, is at the bedside and reports that patient has been having trouble with weakness and falls especially when she has a UTI.  Daughter is agreeable to SNF but only at [facility].  Daughter may be interested in outpatient palliative services but wants to think on it before deciding.   Daughter reports that patient walks with a walker at the facility and when weak is in a wheelchair.  Patient does have some dementia and is slotted to go to the [facility] building of [place].   TOC team will send referral to [facility].   Expected Discharge Plan: Skilled Nursing Facility Barriers to Discharge: Continued Medical Work up   Patient Goals and CMS Choice Patient states their goals for this hospitalization and ongoing recovery are:: Patient's daughter agrees to SNF at [facility].gov Compare Post Acute Care list provided to:: Patient Represenative (must comment) Choice offered to / list presented to : Adult Children  Expected Discharge Plan and Services Expected Discharge Plan: [place]   Discharge Planning Services: CM Consult Post Acute Care Choice: [place] Living arrangements for the past 2 months: [place]                   DME Agency: NA       HH Arranged: NA          Prior Living Arrangements/Services Living arrangements for the past 2 months: [place] Lives with:: Facility Resident Patient language and need for interpreter reviewed:: Yes Do you feel safe going back to  the place where you live?: Yes      Need for Family Participation in Patient Care: Yes (Comment) (weakness, dementia) Care giver support system in place?: Yes (comment) (daughter) Current home services: DME (walker and wheelchair) Criminal Activity/Legal Involvement Pertinent to Current Situation/Hospitalization: No - Comment as needed  Activities of Daily Living Home Assistive Devices/Equipment: Bedside commode/3-in-1, Eyeglasses ADL Screening (condition at time of admission) Patient's cognitive ability adequate to safely complete daily activities?: Yes Is the patient deaf or have difficulty hearing?: No Does the patient have difficulty seeing, even when wearing glasses/contacts?: No Does the patient have difficulty concentrating, remembering, or making decisions?: Yes Patient able to express need for assistance with ADLs?: Yes Does the patient have difficulty dressing or bathing?: No Independently performs ADLs?: Yes (appropriate for developmental age) Does the patient have difficulty walking or climbing stairs?: Yes Weakness of Legs: Both Weakness of Arms/Hands: Both  Permission Sought/Granted Permission sought to share information with : Case Manager, Family Supports, Chartered certified accountant granted to share information with : Yes, Verbal Permission Granted  Share Information with NAME: Kristine Jacobson  Permission granted to share info w AGENCY: [facility] [employer] granted to share info w Relationship: daughter     Emotional Assessment Appearance:: Appears stated age Attitude/Demeanor/Rapport: Lethargic Affect (typically observed): Unable to Assess Orientation: : Oriented to Self Alcohol / Substance Use: Not Applicable Psych Involvement: No (comment)  Admission diagnosis:  Dehydration [  E86.0] Acute cystitis with hematuria [N30.01] Generalized weakness [R53.1] AKI (acute kidney injury) ([place]) [N17.9] Patient Active Problem List   Diagnosis  Date Noted  . Rhabdomyolysis 01/30/2020  . Acute lower UTI 01/30/2020  . Iron deficiency anemia 01/30/2020  . AKI (acute kidney injury) ([place]) 01/29/2020  . Chronic UTI 02/22/2019  . GERD (gastroesophageal reflux disease) 11/22/2018  . Lumbar disc disease 11/22/2018  . Skin cancer 11/22/2018  . Memory loss 07/08/2018  . SBO (small bowel obstruction) ([place]) 04/08/2018  . Other constipation 04/08/2018  . Chronic venous insufficiency 01/18/2018  . History of skin cancer 12/24/2017  . Osteoporosis 12/24/2017  . Atherosclerosis of native arteries of the extremities with ulceration ([place]) 09/17/2017  . Bilateral leg edema 08/17/2017  . DDD (degenerative disc disease), cervical 08/04/2017  . Foraminal stenosis of cervical region 08/04/2017  . Chondrocalcinosis of left knee 08/04/2017  . Abnormal gait 08/04/2017  . Atherosclerosis of aorta ([place]) 08/04/2017  . Ulcer of left lower extremity ([place]) 08/04/2017  . Neuropathy 06/28/2017  . Hypertension 03/03/2017  . Macular degeneration 03/03/2017  . Overactive bladder 03/03/2017  . Restless leg syndrome 03/03/2017  . Spinal stenosis 03/03/2017  . Osteoarthritis 03/03/2017  . Low back pain 03/03/2017  . Closed displaced intertrochanteric fracture of left femur with routine healing 05/26/2015  . Fracture of left hip requiring operative repair ([place]) 04/14/2015  . Closed left hip fracture ([place]) 02/27/2015  . SUI (stress urinary incontinence, female) 09/21/2014  . Flank pain, acute 11/22/2013  . Chronic cystitis 04/12/2013  . Incomplete emptying of bladder 04/12/2013  . Increased frequency of urination 04/12/2013  . Urge incontinence 04/12/2013   PCP:  Patient, No Pcp Per Pharmacy:   [facility] [place], [place] - [address] [facility] [address] [place] [address] Phone: [phone] Fax: [phone]  [id number] [place], [place] - [address] [place] [address] Phone: [phone] Fax: [phone]  [facility] ([place] [place], [place] [address] Phone: [phone] Fax: [phone]  [facility] [place], [address] [place] [address] Phone: [phone] Fax: [phone]     Social Determinants of Health (SDOH) Interventions    Readmission Risk Interventions No flowsheet data found.

## 2020-01-30 NOTE — Consult Note (Signed)
Cephas Darby, MD [address]  [place]  [place], [place] [address]  Main: [phone]  Fax: [phone] Pager: [phone]   Consultation  Referring Provider:     No ref. provider found Primary Care Physician:  Patient, No Pcp Per Primary Gastroenterologist:  Dr. Bonna Gains         Reason for Consultation:  Small bowel tumor/malignancy  Date of Admission:  01/29/2020 Date of Consultation:  01/30/2020         HPI:   Kristine Jacobson is a 86 y.o. female with history of constipation and small bowel obstruction in 2019, who was followed by Dr. Bonna Gains, last visit 11/2018.  Patient presented to ER yesterday secondary to generalized weakness, confusion, declining mental status as well as suprapubic discomfort.  Patient was hemodynamically stable on admission, labs revealed mild leukocytosis, mild normocytic anemia, mild AKI as well as mild hypoalbuminemia.  She underwent CT abdomen and pelvis without contrast which revealed as below  1. Asymmetric enlargement and perinephric stranding about the right kidney with some mild distension of a right extrarenal pelvis and associated urothelial thickening. A punctate 2 mm calculus is seen in the mid right ureter. Findings are consistent with at least partially obstructive urolithiasis though a superimposed ascending tract infection is suspected as well given the extent of inflammatory findings about the ureter, bladder and kidney. 2. Circumferential thickening of the distal small bowel with exoenteric masslike extension into the mesentery and irregular adjacent mesenteric adenopathy without resulting obstruction. Findings are highly concerning for a small bowel malignancy, suspect small bowel. 3. Additional upper abdominal and retroperitoneal adenopathy is more nonspecific given the inflammatory changes in the upper abdomen. 4. Asymmetric rectal wall thickening is noted though this may be related to marked pelvic floor laxity  and possible rectocele. Recommend correlation with direct visualization. 5. Age indeterminate bilateral sacral insufficiency fractures. 6. Left femoral head articular surface collapse likely secondary to severe osteoarthrosis and geode formation though some underlying osteonecrosis is not fully excluded. 7. 4 mm nodule in the left lung base. Given findings in the abdomen, metastatic disease is not fully excluded. 8. Cholelithiasis without evidence of acute cholecystitis. 9. Cardiomegaly and Three-vessel coronary artery atherosclerosis.  GI is consulted to evaluate for possible distal small bowel malignancy Patient underwent MR enterography in 04/2018 which did not reveal any acute small bowel pathology or inflammation or space-occupying lesion Patient's daughter was bedside who explained to me that patient lives in assisted living facility and totally dependent on simple ADLs.  She also has memory issues, is unable to get up from bed on her own. Patient is currently being treated for UTI  NSAIDs: None  Antiplts/Anticoagulants/Anti thrombotics: None  GI Procedures: None  Past Medical History:  Diagnosis Date  . Allergy    seasonal   . Arthritis    shoulders, hands  . Cancer ([place])    [place] face-Dr. Evorn Gong   . Chronic neck pain    2/2 bulging discs   . HBP (high blood pressure)   . Hearing aid worn    bilateral  . Macular degeneration    dx'ed 2015 dry now left wet and right mild wet [facility] Dr. Michelene Heady  . Macular degeneration, age related   . Osteoporosis   . Overactive bladder   . Restless leg   . SBO (small bowel obstruction) ([place])    01/2017, 02/2018  . Spinal stenosis   . Urinary incontinence    s/p monthly PTNS with Dr.  Cope, no help with overactive bladder with meds nor Botox   . UTI (urinary tract infection)   . Vertigo    no episodes for several years  . Wears dentures    partial upper and lower    Past Surgical History:  Procedure Laterality Date  .  ABDOMINAL HYSTERECTOMY     1967  . APPENDECTOMY     1967  . BLADDER SURGERY     bladder suspension; Apr 30, 2003, 2011-Dr. Cope  . BREAST EXCISIONAL BIOPSY Right 1970   neg  . BROW LIFT Bilateral 12/01/2016   Procedure: BLEPHAROPLASTY upper eyelid with excess skin;  Surgeon: Karle Starch, MD;  Location: [facility];  Service: Ophthalmology;  Laterality: Bilateral;  MAC  . CARPAL TUNNEL RELEASE     right 04-29-14   . Esophageal dilitation    . eyelid surgery     04-29-16  . FRACTURE SURGERY    . HIP SURGERY     left 04/29/14   . INTRAMEDULLARY (IM) NAIL INTERTROCHANTERIC Left 02/27/2015   Procedure: INTRAMEDULLARY (IM) NAIL INTERTROCHANTRIC;  Surgeon: Dereck Leep, MD;  Location: [facility];  Service: Orthopedics;  Laterality: Left;  . PTOSIS REPAIR Bilateral 12/01/2016   Procedure: PTOSIS REPAIR  repair resect ex;  Surgeon: Karle Starch, MD;  Location: [facility];  Service: Ophthalmology;  Laterality: Bilateral;  . TONSILLECTOMY      Current Facility-Administered Medications:  .  0.9 %  sodium chloride infusion, 75 mL/hr, Intravenous, Continuous, Alma Friendly, MD, Last Rate: 75 mL/hr at 01/30/20 0841, 75 mL/hr at 01/30/20 0841 .  acetaminophen (TYLENOL) tablet 650 mg, 650 mg, Oral, Q6H PRN, 650 mg at 01/30/20 0839 **OR** acetaminophen (TYLENOL) suppository 650 mg, 650 mg, Rectal, Q6H PRN, Doutova, Anastassia, MD .  amLODipine (NORVASC) tablet 5 mg, 5 mg, Oral, Daily, Doutova, Anastassia, MD, 5 mg at 01/30/20 0839 .  bisacodyl (DULCOLAX) EC tablet 5 mg, 5 mg, Oral, Daily PRN, Doutova, Anastassia, MD .  carvedilol (COREG) tablet 6.25 mg, 6.25 mg, Oral, BID WC, Doutova, Anastassia, MD, 6.25 mg at 01/30/20 0838 .  cefTRIAXone (ROCEPHIN) 1 g in sodium chloride 0.9 % 100 mL IVPB, 1 g, Intravenous, Q24H, Doutova, Anastassia, MD .  enoxaparin (LOVENOX) injection 40 mg, 40 mg, Subcutaneous, Q24H, Ezenduka, Adline Peals, MD .  HYDROmorphone (DILAUDID) 1 MG/ML injection, , , ,  .  [START ON  01/31/2020] linaclotide (LINZESS) capsule 290 mcg, 290 mcg, Oral, QAC breakfast, Alma Friendly, MD .  methocarbamol (ROBAXIN) tablet 500 mg, 500 mg, Oral, Q8H PRN, Doutova, Anastassia, MD, 500 mg at 01/30/20 0407 .  rOPINIRole (REQUIP) tablet 0.5 mg, 0.5 mg, Oral, TID AC, Doutova, Anastassia, MD, 0.5 mg at 01/30/20 1217-04-29 .  traMADol (ULTRAM) tablet 50 mg, 50 mg, Oral, Daily PRN, Doutova, Anastassia, MD, 50 mg at 01/30/20 0407   Family History  Problem Relation Age of Onset  . Diabetes Mother   . Arthritis Mother   . Stroke Mother   . Heart disease Father   . Diabetes Sister   . Cancer Brother        lung 2/2 asbestos   . Diabetes Daughter   . Cancer Daughter        DCIS breast   . Diabetes Brother   . Diabetes Brother   . Heart attack Grandson        died age 62 in  04/29/2017  . Breast cancer Neg Hx      Social History   Tobacco Use  .  Smoking status: Never Smoker  . Smokeless tobacco: Never Used  Vaping Use  . Vaping Use: Never used  Substance Use Topics  . Alcohol use: No  . Drug use: No    Allergies as of 01/29/2020 - Review Complete 01/29/2020  Allergen Reaction Noted  . Macrobid [nitrofurantoin macrocrystal]  02/22/2019  . Codeine Rash 06/08/2013    Review of Systems:    All systems reviewed and negative except where noted in HPI.   Physical Exam:  Vital signs in last 24 hours: Temp:  [97.8 F (36.6 C)-98.5 F (36.9 C)] 98 F (36.7 C) (11/16 1138) Pulse Rate:  [67-88] 67 (11/16 1138) Resp:  [17-20] 20 (11/16 1138) BP: (121-161)/(64-76) 121/69 (11/16 1138) SpO2:  [95 %-98 %] 95 % (11/16 1138) Last BM Date:  (pt not sure) General:   Pleasant, cooperative in NAD, thin built Head:  Normocephalic and atraumatic. Eyes:   No icterus.   Conjunctiva pink. PERRLA. Ears:  Normal auditory acuity. Neck:  Supple; no masses or thyroidomegaly Lungs: Respirations even and unlabored. Lungs clear to auscultation bilaterally.   No wheezes, crackles, or rhonchi.    Heart:  Regular rate and rhythm;  Without murmur, clicks, rubs or gallops Abdomen:  Soft, nondistended, mild suprapubic tenderness. Normal bowel sounds. No appreciable masses or hepatomegaly.  No rebound or guarding.  Rectal:  Not performed. Msk:  Symmetrical without gross deformities.  Generalized weakness Extremities:  Without edema, cyanosis or clubbing. Neurologic:  Alert and oriented x3;  grossly normal neurologically. Skin:  Intact without significant lesions or rashes. Cervical Nodes:  No significant cervical adenopathy. Psych:  Alert and cooperative. Normal affect.  LAB RESULTS: CBC Latest Ref Rng & Units 01/30/2020 01/29/2020 03/12/2019  WBC 4.0 - 10.5 K/uL 10.3 12.4(H) 8.3  Hemoglobin 12.0 - 15.0 g/dL 11.8(L) 11.6(L) 13.6  Hematocrit 36 - 46 % 34.7(L) 35.2(L) 40.6  Platelets 150 - 400 K/uL 217 219 195    BMET BMP Latest Ref Rng & Units 01/30/2020 01/29/2020 03/12/2019  Glucose 70 - 99 mg/dL 101(H) 114(H) 116(H)  BUN 8 - 23 mg/dL 34(H) 36(H) 19  Creatinine 0.44 - 1.00 mg/dL 0.88 1.07(H) 0.72  Sodium 135 - 145 mmol/L 131(L) 131(L) 140  Potassium 3.5 - 5.1 mmol/L 3.7 3.8 3.9  Chloride 98 - 111 mmol/L 96(L) 93(L) 105  CO2 22 - 32 mmol/L _0 Calcium 8.9 - 10.3 mg/dL 8.3(L) 8.4(L) 9.7    LFT Hepatic Function Latest Ref Rng & Units 01/30/2020 01/29/2020 07/14/2018  Total Protein 6.5 - 8.1 g/dL 7.4 7.8 7.4  Albumin 3.5 - 5.0 g/dL 3.3(L) 3.2(L) 4.2  AST 15 - 41 U/L 67(H) 66(H) 27  ALT 0 - 44 U/L 35 32 18  Alk Phosphatase 38 - 126 U/L 75 78 65  Total Bilirubin 0.3 - 1.2 mg/dL 0.9 1.0 0.4  Bilirubin, Direct 0.1 - 0.5 mg/dL - - -     STUDIES: DG Chest Portable 1 View  Result Date: 01/29/2020 CLINICAL DATA:  Weakness EXAM: PORTABLE CHEST 1 VIEW COMPARISON:  09/07/2018 FINDINGS: Mild cardiac enlargement without heart failure. No edema or effusion. Elevated right hemidiaphragm with mild right lower lobe atelectasis. Negative for pneumonia. IMPRESSION: No active  disease. Electronically Signed   By: Franchot Gallo M.D.   On: 01/29/2020 20:49   CT RENAL STONE STUDY  Addendum Date: 01/30/2020   ADDENDUM REPORT: 01/30/2020 01:31 ADDENDUM: These results were called by telephone at the time of addendum submission on 01/30/2020 at 1:31 am to provider [facility]  NP, who verbally acknowledged these results. Electronically Signed   By: Lovena Le M.D.   On: 01/30/2020 01:31   Result Date: 01/30/2020 CLINICAL DATA:  Pyonephrosis EXAM: CT ABDOMEN AND PELVIS WITHOUT CONTRAST TECHNIQUE: Multidetector CT imaging of the abdomen and pelvis was performed following the standard protocol without IV contrast. COMPARISON:  MR enterography 05/13/2018 FINDINGS: Lower chest: 4 mm nodule seen in the left lung base (4/6). Some bandlike areas of atelectasis and/or scarring present in the lung bases as well. Lung bases are otherwise clear. Cardiac size is within normal limits. Small amount of gas in the pulmonary trunk, possibly related to intravenous access. Three-vessel coronary artery atherosclerosis noted. No pericardial effusion. Hepatobiliary: Partial right diaphragmatic eventration with protrusion of the dome of the liver and a cottage low sign as seen on sagittal reconstructions (6/53). Normal hepatic attenuation. Subcentimeter hypoattenuating focus in the left lobe adjacent the gallbladder fossa (2/22) too small to fully characterize on CT imaging but statistically likely benign. No visible worrisome liver lesion. Smooth liver surface contour. Few calcified gallstones layer towards the neck of the gallbladder without biliary ductal dilatation, gallbladder wall thickening or pericholecystic inflammation. Pancreas: Partial fatty replacement of the pancreas. No pancreatic ductal dilatation or surrounding inflammatory changes. Spleen: Normal in size. No concerning splenic lesions. Adrenals/Urinary Tract: Normal adrenal glands. No visible or contour deforming renal lesions. Asymmetric  enlargement and perinephric stranding about the right kidney with some mild distension of a right extrarenal pelvis and associated urothelial thickening. A punctate 2 mm calculus is seen in the mid right ureter (2/56). No left urinary tract dilatation or other visible urolithiasis. Urinary bladder is largely decompressed at the time of exam and therefore poorly evaluated by CT imaging. Cervical bladder wall thickening and perivesicular hazy stranding is noted. Stomach/Bowel: Distal esophagus, stomach and duodenal sweep are unremarkable. There is a focally thickened segment of distal small bowel (2/70, 5/60, 6/93). The markedly thickened wall demonstrates some contiguous soft tissue extension along the more proximal mesenteric leaflets as well with adjacent irregular, enlarged mesenteric lymph nodes. No evidence of a resulting obstruction. Appendix appears to be surgically absent compatible with patient's chart history. No colonic dilatation or wall thickening. Scattered colonic diverticula without focal inflammation to suggest diverticulitis. Asymmetric rectal wall thickening is noted (2/86) though this may be related to marked pelvic floor laxity and possible rectocele. Vascular/Lymphatic: Irregular, enlarged mesenteric adenopathy is seen adjacent the exoenteric mass of the distal small bowel including a 12 mm mesenteric lymph node (2/58). Additional upper abdominal and retroperitoneal adenopathy is more nonspecific including a 10 mm right periaortic lymph node (2/40) given the inflammatory changes in the upper abdomen. Reproductive: Uterus is surgically absent. No concerning adnexal lesions. Other: Asymmetric stranding and edematous changes over the left iliac crest and left hip. More diffuse mild body wall edema. No abdominopelvic free air or fluid. No bowel containing hernias. Musculoskeletal: Significant laxity of the pelvic floor, as described above. The osseous structures appear diffusely demineralized which  may limit detection of small or nondisplaced fractures. Multilevel degenerative changes are present in the imaged portions of the spine. Stepwise retrolisthesis T12-L4. 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 Age indeterminate bilateral sacral insufficiency fractures are noted. Postsurgical changes from prior left femoral intramedullary nail and transcervical pinning with heterotopic ossification about the left hip. Left femoral head articular surface collapse likely secondary to severe osteoarthrosis and geode formation though some underlying osteonecrosis is not fully excluded. IMPRESSION: 1. Asymmetric enlargement and perinephric stranding about  the right kidney with some mild distension of a right extrarenal pelvis and associated urothelial thickening. A punctate 2 mm calculus is seen in the mid right ureter. Findings are consistent with at least partially obstructive urolithiasis though a superimposed ascending tract infection is suspected as well given the extent of inflammatory findings about the ureter, bladder and kidney. 2. Circumferential thickening of the distal small bowel with exoenteric masslike extension into the mesentery and irregular adjacent mesenteric adenopathy without resulting obstruction. Findings are highly concerning for a small bowel malignancy, suspect small bowel. 3. Additional upper abdominal and retroperitoneal adenopathy is more nonspecific given the inflammatory changes in the upper abdomen. 4. Asymmetric rectal wall thickening is noted though this may be related to marked pelvic floor laxity and possible rectocele. Recommend correlation with direct visualization. 5. Age indeterminate bilateral sacral insufficiency fractures. 6. Left femoral head articular surface collapse likely secondary to severe osteoarthrosis and geode formation though some underlying osteonecrosis is not fully excluded. 7. 4 mm nodule in the left lung base. Given  findings in the abdomen, metastatic disease is not fully excluded. 8. Cholelithiasis without evidence of acute cholecystitis. 9. Cardiomegaly and Three-vessel coronary artery atherosclerosis. Currently attempting to contact the ordering provider with a critical value result. Addendum will be submitted upon case discussion. Electronically Signed: By: Lovena Le M.D. On: 01/30/2020 01:13      Impression / Plan:   BRYNLEA SPINDLER is a 86 y.o. female with history of small bowel obstruction of unclear etiology, who lives in assisted living facility, possible dementia, dependent on ADLs is admitted secondary to sepsis from partially obstructive urolithiasis.  Patient is currently being treated for UTI.  CT abdomen and pelvis without contrast incidentally revealed probable malignancy in distal small bowel, with no evidence of small bowel obstruction  I have discussed in length with patient's daughter regarding further diagnostic tests to confirm whether or not patient has small bowel malignancy including colonoscopy with evaluation of terminal ileum with possibility to reach distal small bowel to find the tumor or PET CT as outpatient CT enterography is also an option that can be performed closer to discharge.  Patient already had MR enterography in 04/2018 which did not reveal malignancy.  However, this imaging was done more than 1 year ago, CT enterography is a reasonable option before proceeding with PET/CT.  We can perform this test when patient is more stable as she has to drink a lot of oral contrast.  Patient's daughter expressed understanding of the plan  Thank you for involving me in the care of this patient.      LOS: 0 days   Kristine Sear, MD  01/30/2020, 3:25 PM   Note: This dictation was prepared with Dragon dictation along with smaller phrase technology. Any transcriptional errors that result from this process are unintentional.

## 2020-01-30 NOTE — Evaluation (Signed)
Occupational Therapy Evaluation Patient Details Name: Kristine Jacobson MRN: [id number] DOB: 1923/09/19 Today's Date: 01/30/2020    History of Present Illness  86 y.o. female with medical history significant of dementia arthritis skin cancer likely degeneration osteoporosis small bowel obstruction 2019 chronic urinary incontinence recurrent UTIs hypertension.  He with fatigue and AMS, admitted with AKI/UTI.   Clinical Impression   Pt was seen for OT evaluation this date. Pt initially alert and oriented to self, holding a remote to her ear attempting to make a phone call, per pt report upon OT's arrival. Pt expressed difficulty with knowing where she is and why she is here. Pt re-oriented and pt appeared less anxious. Pt reports feeling "whoozy" and a little nauseated. Pt assisted in repositioning requiring cues for sequencing and Mod A. Once repositioned, pt had difficulty keeping her eyes open. RUE repositioned to improve IV mgt. RN notified of session. Pt current requires significant increase in assist (unclear of true baseline ADL performance) 2/2 current impairments as described below (See OT problem list) which functionally limit her ability to perform ADL/self-care tasks. Pt would benefit from skilled OT services to address noted impairments and functional limitations (see below for any additional details) in order to maximize safety and independence while minimizing falls risk and caregiver burden. Upon hospital discharge, recommend STR to maximize pt safety and return to PLOF.     Follow Up Recommendations  SNF    Equipment Recommendations  None recommended by OT    Recommendations for Other Services       Precautions / Restrictions Precautions Precautions: Fall Restrictions Weight Bearing Restrictions: No      Mobility Bed Mobility Overal bed mobility: Needs Assistance Bed Mobility: Supine to Sit;Sit to Supine     Supine to sit: Min assist Sit to supine: Mod assist   General  bed mobility comments: Pt required Mod A for scooting up in bed; unsafe to attempt further 2/2 lethargy/nausea    Transfers Overall transfer level: Needs assistance Equipment used: Rolling walker (2 wheeled) Transfers: Sit to/from Stand Sit to Stand: Mod assist;Max assist         General transfer comment: deferred    Balance Overall balance assessment: Needs assistance Sitting-balance support: Bilateral upper extremity supported Sitting balance-Leahy Scale: Fair     Standing balance support: Bilateral upper extremity supported Standing balance-Leahy Scale: Poor Standing balance comment: Pt is unable to rise on her own even with good UE and LE set up, poor balance/tolerance with WBing/upright standing                           ADL either performed or assessed with clinical judgement   ADL Overall ADL's : Needs assistance/impaired                                       General ADL Comments: given pt's lethargy, formal ADL assessment limited; however, anticipate pt will require at least Mod-Max A for LB ADL and Min A for UB ADL 2/2 weakness, impaired cognition, nausea     Vision Patient Visual Report: Other (comment) (pt too lethargic to verbalize)       Perception     Praxis      Pertinent Vitals/Pain Pain Assessment: No/denies pain     Hand Dominance Right   Extremity/Trunk Assessment Upper Extremity Assessment Upper Extremity Assessment: Generalized weakness  Lower Extremity Assessment Lower Extremity Assessment: Generalized weakness       Communication Communication Communication: No difficulties   Cognition Arousal/Alertness: Lethargic Behavior During Therapy: WFL for tasks assessed/performed Overall Cognitive Status: History of cognitive impairments - at baseline                                 General Comments: Pt oriented to self, initially alert but then fatigued and endorsing mildly "whoozy" and  nauseated   General Comments       Exercises Exercises: General Lower Extremity General Exercises - Lower Extremity Ankle Circles/Pumps: Strengthening;10 reps Quad Sets: Strengthening;10 reps Heel Slides: AROM;5 reps Hip ABduction/ADduction: AROM;5 reps   Shoulder Instructions      Home Living Family/patient expects to be discharged to:: Unsure                                 Additional Comments: Pt unable to verbalize PLOF/home set up; per PT note: Pt reports she lives alone in a Home, report is that she lives at [facility] assisted living      Prior Functioning/Environment Level of Independence: Independent with assistive device(s);Needs assistance        Comments: pt reports relatively independence, apparently from ALF and likely needing assist with many ADLs?        OT Problem List: Decreased strength;Decreased cognition;Decreased activity tolerance;Decreased safety awareness;Decreased knowledge of use of DME or AE;Impaired balance (sitting and/or standing)      OT Treatment/Interventions: Self-care/ADL training;Therapeutic exercise;Therapeutic activities;Cognitive remediation/compensation;DME and/or AE instruction;Patient/family education;Balance training    OT Goals(Current goals can be found in the care plan section) Acute Rehab OT Goals Patient Stated Goal: pt too lethargic to note OT Goal Formulation: Patient unable to participate in goal setting Time For Goal Achievement: 02/13/20 Potential to Achieve Goals: Good ADL Goals Pt Will Perform Grooming: sitting;with supervision;with set-up Pt Will Perform Lower Body Dressing: with min assist;sit to/from stand;with mod assist Pt Will Transfer to Toilet: bedside commode;ambulating;with mod assist (LRAD for amb)  OT Frequency: Min 1X/week   Barriers to D/C:            Co-evaluation              AM-PAC OT "6 Clicks" Daily Activity     Outcome Measure Help from another person eating meals?:  None Help from another person taking care of personal grooming?: A Little Help from another person toileting, which includes using toliet, bedpan, or urinal?: A Lot Help from another person bathing (including washing, rinsing, drying)?: A Lot Help from another person to put on and taking off regular upper body clothing?: A Lot Help from another person to put on and taking off regular lower body clothing?: A Lot 6 Click Score: 15   End of Session Nurse Communication: Other (comment) (lethargy, whoozy, nauseated)  Activity Tolerance: Patient limited by lethargy;Patient limited by fatigue Patient left: in bed;with call bell/phone within reach;with bed alarm set  OT Visit Diagnosis: Other abnormalities of gait and mobility (R26.89);Muscle weakness (generalized) (M62.81);Other symptoms and signs involving cognitive function                Time: 1004-1016 OT Time Calculation (min): 12 min Charges:  OT General Charges $OT Visit: 1 Visit OT Evaluation $OT Eval High Complexity: 1 High  Jeni Salles, MPH, MS, OTR/L ascom [phone] 01/30/20, 1:48 PM

## 2020-01-30 NOTE — NC FL2 (Signed)
[place] LEVEL OF CARE SCREENING TOOL     IDENTIFICATION  Patient Name: Kristine Jacobson Birthdate: March 26, 1923 Sex: female Admission Date (Current Location): 01/29/2020  [place] and [place] Number:  Engineering geologist and Address:  [facility], [address], [place], [place] [address]      Provider Number: [id number]  Attending Physician Name and Address:  Alma Friendly, MD  Relative Name and Phone Number:  Waldron Session (daughter) [phone]    Current Level of Care: Hospital Recommended Level of Care: [place] Prior Approval Number:    Date Approved/Denied:   PASRR Number: [id number]  Discharge Plan: SNF    Current Diagnoses: Patient Active Problem List   Diagnosis Date Noted  . Rhabdomyolysis 01/30/2020  . Acute lower UTI 01/30/2020  . Iron deficiency anemia 01/30/2020  . AKI (acute kidney injury) ([place]) 01/29/2020  . Chronic UTI 02/22/2019  . GERD (gastroesophageal reflux disease) 11/22/2018  . Lumbar disc disease 11/22/2018  . Skin cancer 11/22/2018  . Memory loss 07/08/2018  . SBO (small bowel obstruction) ([place]) 04/08/2018  . Other constipation 04/08/2018  . Chronic venous insufficiency 01/18/2018  . History of skin cancer 12/24/2017  . Osteoporosis 12/24/2017  . Atherosclerosis of native arteries of the extremities with ulceration ([place]) 09/17/2017  . Bilateral leg edema 08/17/2017  . DDD (degenerative disc disease), cervical 08/04/2017  . Foraminal stenosis of cervical region 08/04/2017  . Chondrocalcinosis of left knee 08/04/2017  . Abnormal gait 08/04/2017  . Atherosclerosis of aorta ([place]) 08/04/2017  . Ulcer of left lower extremity ([place]) 08/04/2017  . Neuropathy 06/28/2017  . Hypertension 03/03/2017  . Macular degeneration 03/03/2017  . Overactive bladder 03/03/2017  . Restless leg syndrome 03/03/2017  . Spinal stenosis 03/03/2017  . Osteoarthritis 03/03/2017  . Low back pain  03/03/2017  . Closed displaced intertrochanteric fracture of left femur with routine healing 05/26/2015  . Fracture of left hip requiring operative repair ([place]) 04/14/2015  . Closed left hip fracture ([place]) 02/27/2015  . SUI (stress urinary incontinence, female) 09/21/2014  . Flank pain, acute 11/22/2013  . Chronic cystitis 04/12/2013  . Incomplete emptying of bladder 04/12/2013  . Increased frequency of urination 04/12/2013  . Urge incontinence 04/12/2013    Orientation RESPIRATION BLADDER Height & Weight     Self  Normal Incontinent Weight:   Height:     BEHAVIORAL SYMPTOMS/MOOD NEUROLOGICAL BOWEL NUTRITION STATUS      Continent Diet (Regular)  AMBULATORY STATUS COMMUNICATION OF NEEDS Skin   Limited Assist Verbally Normal                       Personal Care Assistance Level of Assistance  Bathing, Feeding, Dressing Bathing Assistance: Limited assistance Feeding assistance: Limited assistance Dressing Assistance: Limited assistance     Functional Limitations Info             SPECIAL CARE FACTORS FREQUENCY  PT (By licensed PT), OT (By licensed OT)     PT Frequency: 5 times per week OT Frequency: 5 times per week            Contractures Contractures Info: Not present    Additional Factors Info  Code Status, Allergies Code Status Info: DNR Allergies Info: Macrobid, codeine           Current Medications (01/30/2020):  This is the current hospital active medication list Current Facility-Administered Medications  Medication Dose Route Frequency Provider Last Rate Last Admin  . 0.9 %  sodium chloride infusion  75 mL/hr Intravenous Continuous Alma Friendly, MD 75 mL/hr at 01/30/20 0841 75 mL/hr at 01/30/20 0841  . acetaminophen (TYLENOL) tablet 650 mg  650 mg Oral Q6H PRN Toy Baker, MD   650 mg at 01/30/20 0839   Or  . acetaminophen (TYLENOL) suppository 650 mg  650 mg Rectal Q6H PRN Doutova, Anastassia, MD      . amLODipine (NORVASC)  tablet 5 mg  5 mg Oral Daily Doutova, Anastassia, MD   5 mg at 01/30/20 0839  . bisacodyl (DULCOLAX) EC tablet 5 mg  5 mg Oral Daily PRN Doutova, Anastassia, MD      . carvedilol (COREG) tablet 6.25 mg  6.25 mg Oral BID WC Doutova, Anastassia, MD   6.25 mg at 01/30/20 0838  . cefTRIAXone (ROCEPHIN) 1 g in sodium chloride 0.9 % 100 mL IVPB  1 g Intravenous Q24H Doutova, Anastassia, MD      . enoxaparin (LOVENOX) injection 40 mg  40 mg Subcutaneous Q24H Alma Friendly, MD      . HYDROmorphone (DILAUDID) 1 MG/ML injection           . [START ON 01/31/2020] linaclotide (LINZESS) capsule 290 mcg  290 mcg Oral QAC breakfast Alma Friendly, MD      . methocarbamol (ROBAXIN) tablet 500 mg  500 mg Oral Q8H PRN Toy Baker, MD   500 mg at 01/30/20 0407  . rOPINIRole (REQUIP) tablet 0.5 mg  0.5 mg Oral TID AC Doutova, Anastassia, MD   0.5 mg at 01/30/20 1219  . traMADol (ULTRAM) tablet 50 mg  50 mg Oral Daily PRN Toy Baker, MD   50 mg at 01/30/20 0407     Discharge Medications: Please see discharge summary for a list of discharge medications.  Relevant Imaging Results:  Relevant Lab Results:   Additional Information SS# [id number]  Shelbie Hutching, RN

## 2020-01-30 NOTE — Progress Notes (Signed)
    BRIEF OVERNIGHT PROGRESS REPORT   Notified of critical CT renal stone study results by [facility] radiologist. Per radiologist, there is partially obstructive urolithiasis, suspected small bowel malignancy, 4 mm lung mass concerning for mets and cholelithiasis without evidence of cholecystitis.  -Cont. Current treatment regimen per admitting. -Obtain Urology consult -Hematology/oncology consult if appropriate, I spoke with the daughter and she would like to be consulted first for further discussion on goals of care.     Rufina Falco, BSN, MSN, DNP, [employer]  [facility] Nurse Practitioner  [facility]

## 2020-01-30 NOTE — Progress Notes (Signed)
PROGRESS NOTE  Kristine Alarid Jacobson ELF:[id number] DOB: 08-20-1923 DOA: 01/29/2020 PCP: Patient, No Pcp Per  HPI/Recap of past 24 hours: HPI from Dr Jan Fireman Kristine Jacobson is a 86 y.o. female with medical history significant of dementia, arthritis, skin cancer, osteoporosis, small bowel obstruction 2019, chronic urinary incontinence, recurrent UTIs, hypertension, presented with weakness and AMS for the past 3 to 4 days. Reported mild supra pubic discomfort, denies any fever/chills, denies any chest pain, shortness of breath. Per family patient is supposed to be on doxycycline chronically, every time she comes off she gets a UTI. Daughter feels strongly that patient should be discharged on maintenance Doxycycline.  In the ED, noted to have AKI, with mild hyponatremia. Patient admitted for further management.  CT renal stone done showed partially obstructive urolithiasis, suspected small bowel malignancy, 4 mm lung mass concerning for mets and cholelithiasis without evidence of cholecystitis.  Urology and GI consulted.    Today, patient denied any new complaints, denies any abdominal pain, chest pain, shortness of breath, nausea/vomiting, fever/chills.    Assessment/Plan: Active Problems:   Hypertension   Restless leg syndrome   Low back pain   Abnormal gait   AKI (acute kidney injury) ([place])   Rhabdomyolysis   Acute lower UTI   Iron deficiency anemia   UTI Likely infected partially obstructive 2 mm R ureteral stone UA showed large leukocytes, negative nitrites, large hemoglobin, many bacteria, greater than 50 WBC UC pending CT renal stone done showed partially obstructive urolithiasis, Urology consulted, family declined any intervention, continue antibiotics for 14 days, outpatient follow-up in 4 weeks for further management Continue IV ceftriaxone pending urine cultures Gentle IV fluid Monitor closely  Acute metabolic encephalopathy Likely 2/2 above Currently improved, at  baseline Monitor closely  Mild AKI Resolved s/p IV fluid Daily BMP  Hyponatremia Continue IV fluids  Rhabdomyolysis Continue gentle IV hydration Daily CK until back to normal  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   Weight as of 04/05/19: 67 kg.     Code Status: DNR  Family Communication: Discussed with daughter on 01/30/20  Disposition Plan: Status is: Inpatient  Remains inpatient appropriate because:Inpatient level of care appropriate due to severity of illness   Dispo: The patient is from: ALF              Anticipated d/c is to: SNF              Anticipated d/c date is: 2 days              Patient currently is not medically stable to d/c.   Consultants:  Urology  GI  Palliative  Procedures:  None  Antimicrobials:  Ceftriaxone  DVT prophylaxis: Lovenox   Objective: Vitals:   01/30/20 0127 01/30/20 0458 01/30/20 0812 01/30/20 1138  BP: 138/75 (!) 161/64 (!) 141/64 121/69  Pulse: 69 88 82 67  Resp: 18 20 20 20   Temp: 98 F (36.7 C) 97.8 F (36.6  C) 98.5 F (36.9 C) 98 F (36.7 C)  TempSrc:   Oral Oral  SpO2: 96% 98% 97% 95%    Intake/Output Summary (Last 24 hours) at 01/30/2020 1358 Last data filed at 01/30/2020 0036 Gross per 24 hour  Intake 100 ml  Output --  Net 100 ml   There were no vitals filed for this visit.  Exam:  General: NAD, alert, awake, not oriented to place  Cardiovascular: S1, S2  present  Respiratory: CTAB  Abdomen: Soft, nontender, nondistended, bowel sounds present  Musculoskeletal: No bilateral pedal edema noted  Skin: Normal  Psychiatry: Normal mood    Data Reviewed: CBC: Recent Labs  Lab 01/29/20 2022 01/30/20 0505  WBC 12.4* 10.3  NEUTROABS 10.0* 7.6  HGB 11.6* 11.8*  HCT 35.2* 34.7*  MCV 83.6 81.8  PLT 219 371   Basic Metabolic Panel: Recent Labs  Lab 01/29/20 2022 01/29/20 2315 01/30/20 0505  NA 131*  --  131*  K 3.8  --  3.7  CL 93*  --  96*  CO2 27  --  24  GLUCOSE 114*  --  101*  BUN 36*  --  34*  CREATININE 1.07*  --  0.88  CALCIUM 8.4*  --  8.3*  MG  --  2.4 2.3  PHOS  --  3.1 2.8   GFR: CrCl cannot be calculated (Unknown ideal weight.). Liver Function Tests: Recent Labs  Lab 01/29/20 2022 01/30/20 0505  AST 66* 67*  ALT 32 35  ALKPHOS 78 75  BILITOT 1.0 0.9  PROT 7.8 7.4  ALBUMIN 3.2* 3.3*   No results for input(s): LIPASE, AMYLASE in the last 168 hours. No results for input(s): AMMONIA in the last 168 hours. Coagulation Profile: No results for input(s): INR, PROTIME in the last 168 hours. Cardiac Enzymes: Recent Labs  Lab 01/29/20 2257 01/30/20 0505  CKTOTAL 1,019* 890*   BNP (last 3 results) No results for input(s): PROBNP in the last 8760 hours. HbA1C: No results for input(s): HGBA1C in the last 72 hours. CBG: No results for input(s): GLUCAP in the last 168 hours. Lipid Profile: No results for input(s): CHOL, HDL, LDLCALC, TRIG, CHOLHDL, LDLDIRECT in the last 72 hours. Thyroid Function Tests: Recent Labs    01/30/20 0505  TSH 2.389   Anemia Panel: Recent Labs    01/29/20 2257 01/30/20 0505  VITAMINB12  --  1,196*  FOLATE 18.8  --   FERRITIN 130  --   TIBC 276  --   IRON 27*  --   RETICCTPCT  --  1.6   Urine analysis:    Component Value Date/Time   COLORURINE YELLOW (A) 01/29/2020 2022   APPEARANCEUR TURBID (A) 01/29/2020 2022   APPEARANCEUR Clear 09/05/2018 1041   LABSPEC  1.018 01/29/2020 2022   PHURINE 5.0 01/29/2020 2022   GLUCOSEU NEGATIVE 01/29/2020 2022   HGBUR LARGE (A) 01/29/2020 2022   BILIRUBINUR NEGATIVE 01/29/2020 2022   BILIRUBINUR Negative 09/05/2018 1041   KETONESUR 5 (A) 01/29/2020 2022   PROTEINUR 100 (A) 01/29/2020 2022   NITRITE NEGATIVE 01/29/2020 2022   LEUKOCYTESUR LARGE (A) 01/29/2020 2022   Sepsis Labs: @LABRCNTIP (procalcitonin:4,lacticidven:4)  ) Recent Results (from the past 240 hour(s))  Respiratory Panel by RT PCR (Flu A&B, Covid) - Nasopharyngeal Swab     Status: None   Collection Time: 01/29/20 10:19 PM   Specimen: Nasopharyngeal Swab  Result Value Ref Range Status   SARS Coronavirus 2 by RT PCR NEGATIVE NEGATIVE Final    Comment: (  NOTE) SARS-CoV-2 target nucleic acids are NOT DETECTED.  The SARS-CoV-2 RNA is generally detectable in upper respiratoy specimens during the acute phase of infection. The lowest concentration of SARS-CoV-2 viral copies this assay can detect is 131 copies/mL. A negative result does not preclude SARS-Cov-2 infection and should not be used as the sole basis for treatment or other patient management decisions. A negative result may occur with  improper specimen collection/handling, submission of specimen other than nasopharyngeal swab, presence of viral mutation(s) within the areas targeted by this assay, and inadequate number of viral copies (<131 copies/mL). A negative result must be combined with clinical observations, patient history, and epidemiological information. The expected result is Negative.  Fact Sheet for Patients:  PinkCheek.be  Fact Sheet for Healthcare Providers:  GravelBags.it  This test is no t yet approved or cleared by the [place] FDA and  has been authorized for detection and/or diagnosis of SARS-CoV-2 by FDA under an Emergency Use Authorization (EUA). This EUA will remain  in effect (meaning this test  can be used) for the duration of the COVID-19 declaration under Section 564(b)(1) of the Act, 21 U.S.C. section 360bbb-3(b)(1), unless the authorization is terminated or revoked sooner.     Influenza A by PCR NEGATIVE NEGATIVE Final   Influenza B by PCR NEGATIVE NEGATIVE Final    Comment: (NOTE) The Xpert Xpress SARS-CoV-2/FLU/RSV assay is intended as an aid in  the diagnosis of influenza from Nasopharyngeal swab specimens and  should not be used as a sole basis for treatment. Nasal washings and  aspirates are unacceptable for Xpert Xpress SARS-CoV-2/FLU/RSV  testing.  Fact Sheet for Patients: PinkCheek.be  Fact Sheet for Healthcare Providers: GravelBags.it  This test is not yet approved or cleared by the [place] FDA and  has been authorized for detection and/or diagnosis of SARS-CoV-2 by  FDA under an Emergency Use Authorization (EUA). This EUA will remain  in effect (meaning this test can be used) for the duration of the  Covid-19 declaration under Section 564(b)(1) of the Act, 21  U.S.C. section 360bbb-3(b)(1), unless the authorization is  terminated or revoked. Performed at [facility], [address] [place], [place] [address]       Studies: DG Chest Portable 1 View  Result Date: 01/29/2020 CLINICAL DATA:  Weakness EXAM: PORTABLE CHEST 1 VIEW COMPARISON:  09/07/2018 FINDINGS: Mild cardiac enlargement without heart failure. No edema or effusion. Elevated right hemidiaphragm with mild right lower lobe atelectasis. Negative for pneumonia. IMPRESSION: No active disease. Electronically Signed   By: Franchot Gallo M.D.   On: 01/29/2020 20:49   CT RENAL STONE STUDY  Addendum Date: 01/30/2020   ADDENDUM REPORT: 01/30/2020 01:31 ADDENDUM: These results were called by telephone at the time of addendum submission on 01/30/2020 at 1:31 am to provider Ouma NP, who verbally acknowledged these results.  Electronically Signed   By: Lovena Le M.D.   On: 01/30/2020 01:31   Result Date: 01/30/2020 CLINICAL DATA:  Pyonephrosis EXAM: CT ABDOMEN AND PELVIS WITHOUT CONTRAST TECHNIQUE: Multidetector CT imaging of the abdomen and pelvis was performed following the standard protocol without IV contrast. COMPARISON:  MR enterography 05/13/2018 FINDINGS: Lower chest: 4 mm nodule seen in the left lung base (4/6). Some bandlike areas of atelectasis and/or scarring present in the lung bases as well. Lung bases are otherwise clear. Cardiac size is within normal limits. Small amount of gas in the pulmonary trunk, possibly related to intravenous access. Three-vessel coronary artery atherosclerosis noted. No pericardial effusion.  Hepatobiliary: Partial right diaphragmatic eventration with protrusion of the dome of the liver and a cottage low sign as seen on sagittal reconstructions (6/53). Normal hepatic attenuation. Subcentimeter hypoattenuating focus in the left lobe adjacent the gallbladder fossa (2/22) too small to fully characterize on CT imaging but statistically likely benign. No visible worrisome liver lesion. Smooth liver surface contour. Few calcified gallstones layer towards the neck of the gallbladder without biliary ductal dilatation, gallbladder wall thickening or pericholecystic inflammation. Pancreas: Partial fatty replacement of the pancreas. No pancreatic ductal dilatation or surrounding inflammatory changes. Spleen: Normal in size. No concerning splenic lesions. Adrenals/Urinary Tract: Normal adrenal glands. No visible or contour deforming renal lesions. Asymmetric enlargement and perinephric stranding about the right kidney with some mild distension of a right extrarenal pelvis and associated urothelial thickening. A punctate 2 mm calculus is seen in the mid right ureter (2/56). No left urinary tract dilatation or other visible urolithiasis. Urinary bladder is largely decompressed at the time of exam and  therefore poorly evaluated by CT imaging. Cervical bladder wall thickening and perivesicular hazy stranding is noted. Stomach/Bowel: Distal esophagus, stomach and duodenal sweep are unremarkable. There is a focally thickened segment of distal small bowel (2/70, 5/60, 6/93). The markedly thickened wall demonstrates some contiguous soft tissue extension along the more proximal mesenteric leaflets as well with adjacent irregular, enlarged mesenteric lymph nodes. No evidence of a resulting obstruction. Appendix appears to be surgically absent compatible with patient's chart history. No colonic dilatation or wall thickening. Scattered colonic diverticula without focal inflammation to suggest diverticulitis. Asymmetric rectal wall thickening is noted (2/86) though this may be related to marked pelvic floor laxity and possible rectocele. Vascular/Lymphatic: Irregular, enlarged mesenteric adenopathy is seen adjacent the exoenteric mass of the distal small bowel including a 12 mm mesenteric lymph node (2/58). Additional upper abdominal and retroperitoneal adenopathy is more nonspecific including a 10 mm right periaortic lymph node (2/40) given the inflammatory changes in the upper abdomen. Reproductive: Uterus is surgically absent. No concerning adnexal lesions. Other: Asymmetric stranding and edematous changes over the left iliac crest and left hip. More diffuse mild body wall edema. No abdominopelvic free air or fluid. No bowel containing hernias. Musculoskeletal: Significant laxity of the pelvic floor, as described above. The osseous structures appear diffusely demineralized which may limit detection of small or nondisplaced fractures. Multilevel degenerative changes are present in the imaged portions of the spine. Stepwise retrolisthesis T12-L4. 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 Age indeterminate bilateral sacral insufficiency fractures are noted. Postsurgical changes  from prior left femoral intramedullary nail and transcervical pinning with heterotopic ossification about the left hip. Left femoral head articular surface collapse likely secondary to severe osteoarthrosis and geode formation though some underlying osteonecrosis is not fully excluded. IMPRESSION: 1. Asymmetric enlargement and perinephric stranding about the right kidney with some mild distension of a right extrarenal pelvis and associated urothelial thickening. A punctate 2 mm calculus is seen in the mid right ureter. Findings are consistent with at least partially obstructive urolithiasis though a superimposed ascending tract infection is suspected as well given the extent of inflammatory findings about the ureter, bladder and kidney. 2. Circumferential thickening of the distal small bowel with exoenteric masslike extension into the mesentery and irregular adjacent mesenteric adenopathy without resulting obstruction. Findings are highly concerning for a small bowel malignancy, suspect small bowel. 3. Additional upper abdominal and retroperitoneal adenopathy is more nonspecific given the inflammatory changes in the upper abdomen. 4. Asymmetric rectal wall thickening is  noted though this may be related to marked pelvic floor laxity and possible rectocele. Recommend correlation with direct visualization. 5. Age indeterminate bilateral sacral insufficiency fractures. 6. Left femoral head articular surface collapse likely secondary to severe osteoarthrosis and geode formation though some underlying osteonecrosis is not fully excluded. 7. 4 mm nodule in the left lung base. Given findings in the abdomen, metastatic disease is not fully excluded. 8. Cholelithiasis without evidence of acute cholecystitis. 9. Cardiomegaly and Three-vessel coronary artery atherosclerosis. Currently attempting to contact the ordering provider with a critical value result. Addendum will be submitted upon case discussion. Electronically Signed:  By: Lovena Le M.D. On: 01/30/2020 01:13    Scheduled Meds: . amLODipine  5 mg Oral Daily  . carvedilol  6.25 mg Oral BID WC  . HYDROmorphone      . rOPINIRole  0.5 mg Oral TID AC    Continuous Infusions: . sodium chloride 75 mL/hr (01/30/20 0841)  . cefTRIAXone (ROCEPHIN)  IV       LOS: 0 days     Alma Friendly, MD [facility]  If 7PM-7AM, please contact night-coverage [email or web address] 01/30/2020, 1:58 PM

## 2020-01-30 NOTE — Progress Notes (Signed)
Difficulty obtaining urine sample due to pt incont. MD notified. No new orders.

## 2020-01-30 NOTE — Evaluation (Signed)
Physical Therapy Evaluation Patient Details Name: Kristine Jacobson MRN: [id number] DOB: 11/04/1923 Today's Date: 01/30/2020   History of Present Illness   86 y.o. female with medical history significant of dementia arthritis skin cancer likely degeneration osteoporosis small bowel obstruction 2019 chronic urinary incontinence recurrent UTIs hypertension.  He with fatigue and AMS, admitted with AKI/UTI.  Clinical Impression  Pt willing to participate with PT but was functionally quite limited.  Ostensibly this patient is able to ambulate ad lib at ALF (pt reports living at home alone).  Overall she seemed much weaker and more limited than recent status, however again pt was unable to fully answer or participate.  Pt unable to take even a single step and even weight shifting (especially on the L LE) was untenible.    Follow Up Recommendations SNF    Equipment Recommendations   (pt reports she does have a FWW at home)    Recommendations for Other Services       Precautions / Restrictions Precautions Precautions: Fall Restrictions Weight Bearing Restrictions: No      Mobility  Bed Mobility Overal bed mobility: Needs Assistance Bed Mobility: Supine to Sit;Sit to Supine     Supine to sit: Min assist Sit to supine: Mod assist        Transfers Overall transfer level: Needs assistance Equipment used: Rolling walker (2 wheeled) Transfers: Sit to/from Stand Sit to Stand: Mod assist;Max assist         General transfer comment: Elevated bed height, unable to rise w/o direct assist.  Poor tolerance with L WBing, tolerance with mobitliy.  Standing for ~4 min needing constant assist   Ambulation/Gait                Stairs            Wheelchair Mobility    Modified Rankin (Stroke Patients Only)       Balance Overall balance assessment: Needs assistance Sitting-balance support: Bilateral upper extremity supported Sitting balance-Leahy Scale: Fair     Standing  balance support: Bilateral upper extremity supported Standing balance-Leahy Scale: Poor Standing balance comment: Pt is unable to rise on her own even with good UE and LE set up, poor balance/tolerance with WBing/upright standing                             Pertinent Vitals/Pain Pain Assessment: No/denies pain (yet very hesitant to do much WBing on the R)    Home Living Family/patient expects to be discharged to:: Unsure                 Additional Comments: PT reports she lives alone in a Home, report is that she lives at [facility]    Prior Function Level of Independence: Independent with assistive device(s);Needs assistance         Comments: pt reports relatively independence, apparently from ALF and likely needing assist with many ADLs?     Hand Dominance        Extremity/Trunk Assessment   Upper Extremity Assessment Upper Extremity Assessment: Generalized weakness    Lower Extremity Assessment Lower Extremity Assessment: Generalized weakness       Communication   Communication: No difficulties  Cognition Arousal/Alertness: Awake/alert Behavior During Therapy: WFL for tasks assessed/performed Overall Cognitive Status: History of cognitive impairments - at baseline  General Comments: Pt showed some awareness with place, situation, but unable to anwer most orientation questions      General Comments      Exercises General Exercises - Lower Extremity Ankle Circles/Pumps: Strengthening;10 reps Quad Sets: Strengthening;10 reps Heel Slides: AROM;5 reps Hip ABduction/ADduction: AROM;5 reps   Assessment/Plan    PT Assessment Patient needs continued PT services  PT Problem List Decreased strength;Decreased range of motion;Decreased activity tolerance;Decreased balance;Decreased mobility;Decreased coordination;Decreased cognition;Decreased safety awareness;Decreased knowledge of use  of DME;Decreased knowledge of precautions;Cardiopulmonary status limiting activity       PT Treatment Interventions Gait training;DME instruction;Stair training;Functional mobility training;Therapeutic activities;Balance training;Therapeutic exercise;Cognitive remediation    PT Goals (Current goals can be found in the Care Plan section)  Acute Rehab PT Goals Patient Stated Goal: get back to walking PT Goal Formulation: With patient Time For Goal Achievement: 02/13/20 Potential to Achieve Goals: Fair    Frequency Min 2X/week   Barriers to discharge        Co-evaluation               AM-PAC PT "6 Clicks" Mobility  Outcome Measure Help needed turning from your back to your side while in a flat bed without using bedrails?: A Lot Help needed moving from lying on your back to sitting on the side of a flat bed without using bedrails?: A Lot Help needed moving to and from a bed to a chair (including a wheelchair)?: A Lot Help needed standing up from a chair using your arms (e.g., wheelchair or bedside chair)?: Total Help needed to walk in hospital room?: Total Help needed climbing 3-5 steps with a railing? : Total 6 Click Score: 9    End of Session Equipment Utilized During Treatment: Gait belt Activity Tolerance: Patient tolerated treatment well Patient left: with call bell/phone within reach;with bed alarm set Nurse Communication: Mobility status PT Visit Diagnosis: Muscle weakness (generalized) (M62.81);Difficulty in walking, not elsewhere classified (R26.2)    Time: 2010-0712 PT Time Calculation (min) (ACUTE ONLY): 28 min   Charges:   PT Evaluation $PT Eval Low Complexity: 1 Low PT Treatments $Therapeutic Activity: 8-22 mins        Kreg Shropshire, DPT 01/30/2020, 11:25 AM

## 2020-01-30 NOTE — Progress Notes (Signed)
Referral noted.  No history of DM noted and Blood glucose =101 mg/dL. Will sign off referral.   Thanks  Adah Perl, RN, BC-ADM Inpatient Diabetes Coordinator Pager [phone]

## 2020-01-31 ENCOUNTER — Other Ambulatory Visit: Payer: Self-pay

## 2020-01-31 DIAGNOSIS — K6389 Other specified diseases of intestine: Secondary | ICD-10-CM | POA: Diagnosis not present

## 2020-01-31 DIAGNOSIS — R404 Transient alteration of awareness: Secondary | ICD-10-CM | POA: Diagnosis not present

## 2020-01-31 DIAGNOSIS — C7802 Secondary malignant neoplasm of left lung: Secondary | ICD-10-CM | POA: Diagnosis not present

## 2020-01-31 DIAGNOSIS — G9341 Metabolic encephalopathy: Secondary | ICD-10-CM | POA: Diagnosis not present

## 2020-01-31 DIAGNOSIS — N201 Calculus of ureter: Secondary | ICD-10-CM | POA: Diagnosis not present

## 2020-01-31 DIAGNOSIS — C179 Malignant neoplasm of small intestine, unspecified: Secondary | ICD-10-CM | POA: Diagnosis not present

## 2020-01-31 DIAGNOSIS — K589 Irritable bowel syndrome without diarrhea: Secondary | ICD-10-CM | POA: Diagnosis not present

## 2020-01-31 DIAGNOSIS — M2550 Pain in unspecified joint: Secondary | ICD-10-CM | POA: Diagnosis not present

## 2020-01-31 DIAGNOSIS — E871 Hypo-osmolality and hyponatremia: Secondary | ICD-10-CM | POA: Diagnosis not present

## 2020-01-31 DIAGNOSIS — I110 Hypertensive heart disease with heart failure: Secondary | ICD-10-CM | POA: Diagnosis not present

## 2020-01-31 DIAGNOSIS — N10 Acute tubulo-interstitial nephritis: Secondary | ICD-10-CM | POA: Diagnosis not present

## 2020-01-31 DIAGNOSIS — Z66 Do not resuscitate: Secondary | ICD-10-CM | POA: Diagnosis not present

## 2020-01-31 DIAGNOSIS — N3001 Acute cystitis with hematuria: Secondary | ICD-10-CM | POA: Diagnosis not present

## 2020-01-31 DIAGNOSIS — E785 Hyperlipidemia, unspecified: Secondary | ICD-10-CM | POA: Diagnosis not present

## 2020-01-31 DIAGNOSIS — Z20822 Contact with and (suspected) exposure to covid-19: Secondary | ICD-10-CM | POA: Diagnosis not present

## 2020-01-31 DIAGNOSIS — I5032 Chronic diastolic (congestive) heart failure: Secondary | ICD-10-CM | POA: Diagnosis not present

## 2020-01-31 DIAGNOSIS — K8020 Calculus of gallbladder without cholecystitis without obstruction: Secondary | ICD-10-CM | POA: Diagnosis not present

## 2020-01-31 DIAGNOSIS — N179 Acute kidney failure, unspecified: Principal | ICD-10-CM

## 2020-01-31 DIAGNOSIS — M810 Age-related osteoporosis without current pathological fracture: Secondary | ICD-10-CM | POA: Diagnosis not present

## 2020-01-31 DIAGNOSIS — G2581 Restless legs syndrome: Secondary | ICD-10-CM | POA: Diagnosis not present

## 2020-01-31 DIAGNOSIS — N3281 Overactive bladder: Secondary | ICD-10-CM | POA: Diagnosis not present

## 2020-01-31 DIAGNOSIS — N390 Urinary tract infection, site not specified: Secondary | ICD-10-CM | POA: Diagnosis not present

## 2020-01-31 DIAGNOSIS — Z7401 Bed confinement status: Secondary | ICD-10-CM | POA: Diagnosis not present

## 2020-01-31 DIAGNOSIS — G8929 Other chronic pain: Secondary | ICD-10-CM | POA: Diagnosis not present

## 2020-01-31 DIAGNOSIS — M5136 Other intervertebral disc degeneration, lumbar region: Secondary | ICD-10-CM | POA: Diagnosis not present

## 2020-01-31 DIAGNOSIS — F0390 Unspecified dementia without behavioral disturbance: Secondary | ICD-10-CM | POA: Diagnosis not present

## 2020-01-31 DIAGNOSIS — M6282 Rhabdomyolysis: Secondary | ICD-10-CM | POA: Diagnosis not present

## 2020-01-31 DIAGNOSIS — J9811 Atelectasis: Secondary | ICD-10-CM | POA: Diagnosis not present

## 2020-01-31 DIAGNOSIS — M4802 Spinal stenosis, cervical region: Secondary | ICD-10-CM | POA: Diagnosis not present

## 2020-01-31 DIAGNOSIS — I70209 Unspecified atherosclerosis of native arteries of extremities, unspecified extremity: Secondary | ICD-10-CM | POA: Diagnosis not present

## 2020-01-31 DIAGNOSIS — I872 Venous insufficiency (chronic) (peripheral): Secondary | ICD-10-CM | POA: Diagnosis not present

## 2020-01-31 DIAGNOSIS — D649 Anemia, unspecified: Secondary | ICD-10-CM | POA: Diagnosis not present

## 2020-01-31 DIAGNOSIS — H353230 Exudative age-related macular degeneration, bilateral, stage unspecified: Secondary | ICD-10-CM | POA: Diagnosis not present

## 2020-01-31 DIAGNOSIS — I10 Essential (primary) hypertension: Secondary | ICD-10-CM | POA: Diagnosis not present

## 2020-01-31 LAB — BASIC METABOLIC PANEL
Anion gap: 12 (ref 5–15)
BUN: 25 mg/dL — ABNORMAL HIGH (ref 8–23)
CO2: 24 mmol/L (ref 22–32)
Calcium: 8.2 mg/dL — ABNORMAL LOW (ref 8.9–10.3)
Chloride: 98 mmol/L (ref 98–111)
Creatinine, Ser: 0.67 mg/dL (ref 0.44–1.00)
GFR, Estimated: >60 mL/min (ref >60)
Glucose, Bld: 92 mg/dL (ref 70–99)
Potassium: 3.4 mmol/L — ABNORMAL LOW (ref 3.5–5.1)
Sodium: 134 mmol/L — ABNORMAL LOW (ref 135–145)

## 2020-01-31 LAB — CBC WITH DIFFERENTIAL/PLATELET
Abs Immature Granulocytes: 0.02 10*3/uL (ref 0.00–0.07)
Basophils Absolute: 0 10*3/uL (ref 0.0–0.1)
Basophils Relative: 0 %
Eosinophils Absolute: 0.1 10*3/uL (ref 0.0–0.5)
Eosinophils Relative: 1 %
HCT: 31.2 % — ABNORMAL LOW (ref 36.0–46.0)
Hemoglobin: 10.1 g/dL — ABNORMAL LOW (ref 12.0–15.0)
Immature Granulocytes: 0 %
Lymphocytes Relative: 16 %
Lymphs Abs: 1 10*3/uL (ref 0.7–4.0)
MCH: 27.2 pg (ref 26.0–34.0)
MCHC: 32.4 g/dL (ref 30.0–36.0)
MCV: 84.1 fL (ref 80.0–100.0)
Monocytes Absolute: 0.6 10*3/uL (ref 0.1–1.0)
Monocytes Relative: 9 %
Neutro Abs: 4.6 10*3/uL (ref 1.7–7.7)
Neutrophils Relative %: 74 %
Platelets: 189 10*3/uL (ref 150–400)
RBC: 3.71 MIL/uL — ABNORMAL LOW (ref 3.87–5.11)
RDW: 13.7 % (ref 11.5–15.5)
WBC: 6.3 10*3/uL (ref 4.0–10.5)
nRBC: 0 % (ref 0.0–0.2)

## 2020-01-31 LAB — CK: Total CK: 367 U/L — ABNORMAL HIGH (ref 38–234)

## 2020-01-31 MED ORDER — KETOROLAC TROMETHAMINE 30 MG/ML IJ SOLN
30.0000 mg | Freq: Once | INTRAMUSCULAR | Status: DC
Start: 2020-01-31 — End: 2020-01-31

## 2020-01-31 MED ORDER — POLYETHYLENE GLYCOL 3350 17 G PO PACK
17.0000 g | PACK | Freq: Every day | ORAL | Status: DC
  Administered 2020-01-31: 17 g via ORAL
  Filled 2020-01-31: qty 1

## 2020-01-31 MED ORDER — POTASSIUM CHLORIDE CRYS ER 20 MEQ PO TBCR
40.0000 meq | EXTENDED_RELEASE_TABLET | Freq: Once | ORAL | Status: AC
  Administered 2020-01-31: 14:00:00 40 meq via ORAL
  Filled 2020-01-31: qty 2

## 2020-01-31 MED ORDER — CEFDINIR 300 MG PO CAPS
300.0000 mg | ORAL_CAPSULE | Freq: Two times a day (BID) | ORAL | 0 refills | Status: AC
Start: 2020-01-31 — End: 2020-02-12

## 2020-01-31 NOTE — Progress Notes (Signed)
Pts daughter made aware of discharge to [facility] today

## 2020-01-31 NOTE — Progress Notes (Signed)
Palliative:  Chart reviewed. Visited patient briefly - unable to participate in goals of care conversations independently. Called daughter - daughter was at work and unable to speak long d/t time constraints at work. Patient may be discharged today to [facility] - briefly spoke with daughter about outpatient palliative following up with her for further goals of care discussions and she was agreeable.  Discharging MD, please include in discharge summary recommendation for outpatient palliative care follow up.  Thank you for this consult.  Juel Burrow, DNP, AGNP-C Palliative Medicine Team Team Phone # [phone]  Pager # [phone]  NO CHARGE

## 2020-01-31 NOTE — TOC Transition Note (Signed)
Transition of Care [facility]) - CM/SW Discharge Note   Patient Details  Name: MADALYNN PICKELSIMER MRN: [id number] Date of Birth: [birth year]  Transition of Care [facility]) CM/SW Contact:  Shelbie Hutching, RN Phone Number: 01/31/2020, 2:24 PM   Clinical Narrative:    Patient is medically cleared for discharge to [facility].  [employer] approved insurance auth for SNF.  Daughter [name] updated on discharge today.  Facility asked that we not send the patient until after 3, they need to turn over the room.  Patient will transport with [facility].    Final next level of care: Skilled Nursing Facility Barriers to Discharge: Barriers Resolved   Patient Goals and CMS Choice Patient states their goals for this hospitalization and ongoing recovery are:: Patient's daughter agrees to SNF at [facility].gov Compare Post Acute Care list provided to:: Patient Represenative (must comment) Choice offered to / list presented to : Adult Children  Discharge Placement   Existing PASRR number confirmed : 01/30/20          Patient chooses bed at: [facility] Patient to be transferred to facility by: [facility] Name of family member notified: [name] Patient and family notified of of transfer: 01/31/20  Discharge Plan and Services   Discharge Planning Services: CM Consult Post Acute Care Choice: [place]            DME Agency: NA       HH Arranged: NA          Social Determinants of Health (SDOH) Interventions     Readmission Risk Interventions No flowsheet data found.

## 2020-01-31 NOTE — Progress Notes (Addendum)
Urology Inpatient Progress Note  Subjective: No acute events overnight. She remains afebrile, VSS. Leukocytosis resolved, 6.3.  AKI resolved, creatinine 0.67.  Urine culture pending.  On antibiotics as below. Patient reports ongoing malaise but denies pain or dysuria today.  Anti-infectives: Anti-infectives (From admission, onward)   Start     Dose/Rate Route Frequency Ordered Stop   01/30/20 2300  cefTRIAXone (ROCEPHIN) 1 g in sodium chloride 0.9 % 100 mL IVPB        1 g 200 mL/hr over 30 Minutes Intravenous Every 24 hours 01/29/20 2333     01/29/20 2300  cefTRIAXone (ROCEPHIN) 1 g in sodium chloride 0.9 % 100 mL IVPB        1 g 200 mL/hr over 30 Minutes Intravenous  Once 01/29/20 2258 01/30/20 0036      Current Facility-Administered Medications  Medication Dose Route Frequency Provider Last Rate Last Admin  . 0.9 %  sodium chloride infusion  75 mL/hr Intravenous Continuous Alma Friendly, MD 75 mL/hr at 01/30/20 2326 75 mL/hr at 01/30/20 2326  . acetaminophen (TYLENOL) tablet 650 mg  650 mg Oral Q6H PRN Toy Baker, MD   650 mg at 01/30/20 0839   Or  . acetaminophen (TYLENOL) suppository 650 mg  650 mg Rectal Q6H PRN Doutova, Anastassia, MD      . amLODipine (NORVASC) tablet 5 mg  5 mg Oral Daily Doutova, Anastassia, MD   5 mg at 01/31/20 0742  . bisacodyl (DULCOLAX) EC tablet 5 mg  5 mg Oral Daily PRN Doutova, Anastassia, MD      . carvedilol (COREG) tablet 6.25 mg  6.25 mg Oral BID WC Doutova, Anastassia, MD   6.25 mg at 01/31/20 0742  . cefTRIAXone (ROCEPHIN) 1 g in sodium chloride 0.9 % 100 mL IVPB  1 g Intravenous Q24H Toy Baker, MD 200 mL/hr at 01/30/20 2331 New Bag at 01/30/20 2331  . enoxaparin (LOVENOX) injection 40 mg  40 mg Subcutaneous Q24H Alma Friendly, MD   40 mg at 01/30/20 2210  . gabapentin (NEURONTIN) capsule 200 mg  200 mg Oral TID Lang Snow, NP   200 mg at 01/31/20 0742  . linaclotide (LINZESS) capsule 290 mcg  290  mcg Oral QAC breakfast Alma Friendly, MD   290 mcg at 01/31/20 0753  . methocarbamol (ROBAXIN) tablet 500 mg  500 mg Oral Q8H PRN Toy Baker, MD   500 mg at 01/30/20 0407  . rOPINIRole (REQUIP) tablet 0.5 mg  0.5 mg Oral TID AC Doutova, Anastassia, MD   0.5 mg at 01/31/20 0742  . traMADol (ULTRAM) tablet 50 mg  50 mg Oral Daily PRN Toy Baker, MD   50 mg at 01/31/20 0742   Objective: Vital signs in last 24 hours: Temp:  [97.6 F (36.4 C)-98.5 F (36.9 C)] 98.5 F (36.9 C) (11/17 0748) Pulse Rate:  [63-70] 70 (11/17 0748) Resp:  [16-20] 18 (11/17 0748) BP: (121-165)/(65-85) 165/69 (11/17 0748) SpO2:  [94 %-96 %] 96 % (11/17 0748) Weight:  [72.6 kg] 72.6 kg (11/16 1700)  Intake/Output from previous day: 11/16 0701 - 11/17 0700 In: 255.8 [I.V.:255.8] Out: -  Intake/Output this shift: No intake/output data recorded.  Physical Exam Vitals and nursing note reviewed.  Constitutional:      General: She is not in acute distress.    Appearance: She is not ill-appearing, toxic-appearing or diaphoretic.  HENT:     Head: Normocephalic and atraumatic.  Pulmonary:     Effort: Pulmonary effort is  normal. No respiratory distress.  Skin:    General: Skin is warm and dry.  Neurological:     Mental Status: She is alert.  Psychiatric:        Mood and Affect: Mood normal.        Behavior: Behavior normal.    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  Leukocytosis and AKI resolved today.  She remains afebrile, VSS.   No indication for urgent intervention.  Agree with continued antibiotics and supportive care.  Urology to sign off at this time.  Recommendations: -Continue empiric antibiotics and supportive care and follow urine cultures with plans for 14 days of culture appropriate therapy -Outpatient urology follow-up in 4 weeks for cath UA with renal ultrasound prior  Debroah Loop, PA-C 01/31/2020

## 2020-01-31 NOTE — Progress Notes (Signed)
Daughter given update.

## 2020-01-31 NOTE — TOC Progression Note (Signed)
Transition of Care [facility]) - Progression Note    Patient Details  Name: Kristine Jacobson MRN: [id number] Date of Birth: 07-16-1923  Transition of Care [facility]) CM/SW Contact  Shelbie Hutching, RN Phone Number: 01/31/2020, 9:48 AM  Clinical Narrative:    [facility] has offered a bed and family accepts.  Insurance authorization started with [employer].     Expected Discharge Plan: [place] Barriers to Discharge: Continued Medical Work up  Expected Discharge Plan and Services Expected Discharge Plan: [place]   Discharge Planning Services: CM Consult Post Acute Care Choice: [place] Living arrangements for the past 2 months: [place]                   DME Agency: NA       HH Arranged: NA           Social Determinants of Health (SDOH) Interventions    Readmission Risk Interventions No flowsheet data found.

## 2020-01-31 NOTE — TOC Progression Note (Signed)
Transition of Care [facility]) - Progression Note    Patient Details  Name: Kristine Jacobson MRN: [id number] Date of Birth: 1923-09-23  Transition of Care [facility]) CM/SW Contact  Shelbie Hutching, RN Phone Number: 01/31/2020, 3:28 PM  Clinical Narrative:     EMS transportation has been arranged.  Bedside RN has called report and patient going to room 504.   Expected Discharge Plan: [place] Barriers to Discharge: Barriers Resolved  Expected Discharge Plan and Services Expected Discharge Plan: [place]   Discharge Planning Services: CM Consult Post Acute Care Choice: [place] Living arrangements for the past 2 months: [place] Expected Discharge Date: 01/31/20                 DME Agency: NA       HH Arranged: NA           Social Determinants of Health (SDOH) Interventions    Readmission Risk Interventions No flowsheet data found.

## 2020-01-31 NOTE — Progress Notes (Signed)
Pt being discharged to [facility], report called to [place], SW to call EMS for transport after 3pm, pt with no complaints

## 2020-01-31 NOTE — Discharge Summary (Signed)
Physician Discharge Summary  Kristine Jacobson OYD:[id number] DOB: November 09, 1923 DOA: 01/29/2020  PCP: Patient, No Pcp Per  Admit date: 01/29/2020 Discharge date: 01/31/2020  Admitted From: ALF Disposition:  SNF  Recommendations for Outpatient Follow-up:  1. Follow up with PCP in 1-2 weeks 2. Please obtain BMP/CBC in one week 3. Please follow up on patient's blood pressure.  Her losartan-HCTZ was held at time of discharge due to low/normal BP with it held.  Please resume if indicated. 4. Follow up with urology in clinic in 4 weeks regarding kidney stone with renal ultrasound beforehand 5. Palliative care should follow up with patient and her daughter at rehab, per family request.   6. Follow up abdominal imaging or endoscopy is needed to determine etiology of questionable small bowel malignancy.  GI recommends either colonoscopy, PET CT or CT enterography.   7. Follow up on pending urine culture susceptibilities.  Patient clinically responding well to Rocephin, will continue with Omnicef equivalent oral coverage.  Home Health: No  Equipment/Devices: None   Discharge Condition: Stable  CODE STATUS: DNR  Diet recommendation: Regular    Discharge Diagnoses: Active Problems:   Hypertension   Restless leg syndrome   Low back pain   Abnormal gait   AKI (acute kidney injury) ([place])   Rhabdomyolysis   Acute lower UTI   Iron deficiency anemia    Summary of HPI and Hospital Course:   HPI from Dr Roel Cluck "Kristine Jacobson a 86 y.o.femalewith medical history significant of dementia, arthritis, skin cancer, osteoporosis, small bowel obstruction 2019, chronic urinary incontinence, recurrent UTIs, hypertension, presented withweakness and AMS for the past 3 to 4 days. Reported mild supra pubic discomfort, denies any fever/chills, denies any chest pain, shortness of breath. Per family patient is supposed to be on doxycycline chronically, every time she comes off she gets a UTI. Daughter feels  strongly that patient should be discharged onmaintenance Doxycycline.  In the ED, noted to have AKI, with mild hyponatremia. Patient admitted for further management.  CT renal stone done showed partially obstructive urolithiasis, suspected small bowel malignancy, 4 mm lung mass concerning for mets and cholelithiasis without evidence of cholecystitis.  Urology and GI consulted."   Patient's AKI and rhabdomyolysis resolved with IV hydration, as did hyponatremia.  Overall picture of dehydration at time of admission.    GI was consulted and recommended further evaluation with colonoscopy, PET CT or CT enterography once recovered from acute illness, to further evaluate if any small bowel malignancy.    Urology was consulted and daughter declined any invasive procedures.  Agreed with conservative management of patient's UTI and stone.  She will be continued on antibiotics to complete 14 day course and follow up in clinic in 4 weeks.  Palliative care was consulted, but daughter was unable to participate in goals of care discussion because she was at work.  Recommend outpatient palliative care consultation for overall goals of care discussion.           UTI - clinically improving Likely infected partially obstructive 2 mm R ureteral stone - currently with no pain, N/V UA showed large leukocytes, negative nitrites, large hemoglobin, many bacteria, greater than 50 WBC Urine cx susceptibility pending CT renal stone done showed partially obstructive urolithiasis, Urology consulted, family declined any intervention, continue antibiotics for 14 days, outpatient follow-up in 4 weeks for further management Treated with empiric IV ceftriaxone and transitioned to [place] to complete course Hydrated with gentle IV fluids  Acute metabolic encephalopathy Likely due to  above.  Resolved and at her baseline.  Mild AKI Resolved s/p IV fluids.  Repeat BMP in one week.  Hyponatremia - resolved with IV  hydration Continue IV fluids  Rhabdomyolysis - improving CK with IV fluids  Suspected small bowel malignancy Noted 4 mm lung mass concerning for mets.  GI consulted and recommends further evaluation as outlined above once recovered from acute illness (and if evaluation is within overall goals of care with palliative consult pending).  Hypertension BP stable.  Continue Coreg, amlodipine, hydralazine.  Hyzaar has been held at d/c due to normal BP with it held and AKI just recovered.  Monitor BP and resume if indicated in follow up.  Dementia - at baseline.    Goals of care discussion - Patient with advanced age, poor prognosis. Palliative consultation as outpatient recommended.  Daughter was unavailable to have [place] talk while admitted.    Discharge Instructions   Discharge Instructions    Call MD for:  extreme fatigue   Complete by: As directed    Call MD for:  persistant dizziness or light-headedness   Complete by: As directed    Call MD for:  persistant nausea and vomiting   Complete by: As directed    Call MD for:  severe uncontrolled pain   Complete by: As directed    Call MD for:  temperature >100.4   Complete by: As directed    Diet - low sodium heart healthy   Complete by: As directed    Discharge instructions   Complete by: As directed    Take antibiotic (Omnicef aka cefdinir) twice daily for 12 days for your UTI.  Follow up with urology in 4 week in clinic regarding your kidney stone.  I have stopped your losartan-HCTZ blood pressure medication because your BP is currently a little on low side of normal with that medication being held.  Please follow up with primary care.  This medication may be restarted again if your BP is not controlled.  For now, please hold it to prevent dropped BP too low.   Increase activity slowly   Complete by: As directed      Allergies as of 01/31/2020      Reactions   Macrobid [nitrofurantoin Macrocrystal]    Cough   Codeine Rash       Medication List    STOP taking these medications   CALCIUM 600 + D PO   doxycycline 100 MG tablet Commonly known as: VIBRA-TABS   losartan-hydrochlorothiazide 100-25 MG tablet Commonly known as: Hyzaar     TAKE these medications   acetaminophen 500 MG tablet Commonly known as: TYLENOL Take 1,000 mg by mouth 2 (two) times daily as needed (pain).   amLODipine 5 MG tablet Commonly known as: NORVASC Take 1 tablet (5 mg total) by mouth daily.   bisacodyl 5 MG EC tablet Commonly known as: DULCOLAX Take 5 mg by mouth daily as needed for moderate constipation.   Caltrate 600+D 600-400 MG-UNIT tablet Generic drug: Calcium Carbonate-Vitamin D Take by mouth.   carvedilol 6.25 MG tablet Commonly known as: COREG Take 1 tablet (6.25 mg total) by mouth 2 (two) times daily with a meal.   cefdinir 300 MG capsule Commonly known as: OMNICEF Take 1 capsule (300 mg total) by mouth 2 (two) times daily for 12 days.   gabapentin 100 MG capsule Commonly known as: NEURONTIN Take 2 capsules (200 mg total) by mouth 3 (three) times daily.   hydrALAZINE 25 MG tablet Commonly known  as: APRESOLINE Take 1 tablet (25 mg total) by mouth 2 (two) times daily.   linaclotide 290 MCG Caps capsule Commonly known as: Linzess Take 1 capsule (290 mcg total) by mouth daily before breakfast.   loratadine 10 MG tablet Commonly known as: CLARITIN Take 10 mg by mouth as needed.   lubiprostone 8 MCG capsule Commonly known as: Amitiza Take 1 capsule (8 mcg total) by mouth 2 (two) times daily with a meal.   multivitamin with minerals Tabs tablet Take 1 tablet by mouth daily. Centrum Silver   PreserVision AREDS 2 Caps Take 1 capsule by mouth 2 (two) times daily.   rOPINIRole 0.5 MG tablet Commonly known as: REQUIP TAKE 1 TABLET BY MOUTH IN THE MORNING, 1 TABLET BY MOUTH AT LUNCH, AND 1 TABLET BY MOUTH IN THE EVENING.   SYSTANE BALANCE OP Place 1 drop into both eyes 3 (three) times daily as  needed (dry eyes).   traMADol 50 MG tablet Commonly known as: ULTRAM Take 1 tablet (50 mg total) by mouth every 6 (six) hours as needed for moderate pain. What changed: when to take this       Contact information for after-discharge care    [facility] .   Service: Skilled Nursing Contact information: [place] [phone]                 Allergies  Allergen Reactions  . Macrobid [Nitrofurantoin Macrocrystal]     Cough   . Codeine Rash    Consultations:  Gastroenterology  Urology   Procedures/Studies: DG Chest Portable 1 View  Result Date: 01/29/2020 CLINICAL DATA:  Weakness EXAM: PORTABLE CHEST 1 VIEW COMPARISON:  09/07/2018 FINDINGS: Mild cardiac enlargement without heart failure. No edema or effusion. Elevated right hemidiaphragm with mild right lower lobe atelectasis. Negative for pneumonia. IMPRESSION: No active disease. Electronically Signed   By: Franchot Gallo M.D.   On: 01/29/2020 20:49   CT RENAL STONE STUDY  Addendum Date: 01/30/2020   ADDENDUM REPORT: 01/30/2020 01:31 ADDENDUM: These results were called by telephone at the time of addendum submission on 01/30/2020 at 1:31 am to provider Ouma NP, who verbally acknowledged these results. Electronically Signed   By: Lovena Le M.D.   On: 01/30/2020 01:31   Result Date: 01/30/2020 CLINICAL DATA:  Pyonephrosis EXAM: CT ABDOMEN AND PELVIS WITHOUT CONTRAST TECHNIQUE: Multidetector CT imaging of the abdomen and pelvis was performed following the standard protocol without IV contrast. COMPARISON:  MR enterography 05/13/2018 FINDINGS: Lower chest: 4 mm nodule seen in the left lung base (4/6). Some bandlike areas of atelectasis and/or scarring present in the lung bases as well. Lung bases are otherwise clear. Cardiac size is within normal limits. Small amount of gas in the pulmonary trunk, possibly related to intravenous  access. Three-vessel coronary artery atherosclerosis noted. No pericardial effusion. Hepatobiliary: Partial right diaphragmatic eventration with protrusion of the dome of the liver and a cottage low sign as seen on sagittal reconstructions (6/53). Normal hepatic attenuation. Subcentimeter hypoattenuating focus in the left lobe adjacent the gallbladder fossa (2/22) too small to fully characterize on CT imaging but statistically likely benign. No visible worrisome liver lesion. Smooth liver surface contour. Few calcified gallstones layer towards the neck of the gallbladder without biliary ductal dilatation, gallbladder wall thickening or pericholecystic inflammation. Pancreas: Partial fatty replacement of the pancreas. No pancreatic ductal dilatation or surrounding inflammatory changes. Spleen: Normal in size. No concerning splenic lesions. Adrenals/Urinary  Tract: Normal adrenal glands. No visible or contour deforming renal lesions. Asymmetric enlargement and perinephric stranding about the right kidney with some mild distension of a right extrarenal pelvis and associated urothelial thickening. A punctate 2 mm calculus is seen in the mid right ureter (2/56). No left urinary tract dilatation or other visible urolithiasis. Urinary bladder is largely decompressed at the time of exam and therefore poorly evaluated by CT imaging. Cervical bladder wall thickening and perivesicular hazy stranding is noted. Stomach/Bowel: Distal esophagus, stomach and duodenal sweep are unremarkable. There is a focally thickened segment of distal small bowel (2/70, 5/60, 6/93). The markedly thickened wall demonstrates some contiguous soft tissue extension along the more proximal mesenteric leaflets as well with adjacent irregular, enlarged mesenteric lymph nodes. No evidence of a resulting obstruction. Appendix appears to be surgically absent compatible with patient's chart history. No colonic dilatation or wall thickening. Scattered colonic  diverticula without focal inflammation to suggest diverticulitis. Asymmetric rectal wall thickening is noted (2/86) though this may be related to marked pelvic floor laxity and possible rectocele. Vascular/Lymphatic: Irregular, enlarged mesenteric adenopathy is seen adjacent the exoenteric mass of the distal small bowel including a 12 mm mesenteric lymph node (2/58). Additional upper abdominal and retroperitoneal adenopathy is more nonspecific including a 10 mm right periaortic lymph node (2/40) given the inflammatory changes in the upper abdomen. Reproductive: Uterus is surgically absent. No concerning adnexal lesions. Other: Asymmetric stranding and edematous changes over the left iliac crest and left hip. More diffuse mild body wall edema. No abdominopelvic free air or fluid. No bowel containing hernias. Musculoskeletal: Significant laxity of the pelvic floor, as described above. The osseous structures appear diffusely demineralized which may limit detection of small or nondisplaced fractures. Multilevel degenerative changes are present in the imaged portions of the spine. Stepwise retrolisthesis T12-L4. 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 Age indeterminate bilateral sacral insufficiency fractures are noted. Postsurgical changes from prior left femoral intramedullary nail and transcervical pinning with heterotopic ossification about the left hip. Left femoral head articular surface collapse likely secondary to severe osteoarthrosis and geode formation though some underlying osteonecrosis is not fully excluded. IMPRESSION: 1. Asymmetric enlargement and perinephric stranding about the right kidney with some mild distension of a right extrarenal pelvis and associated urothelial thickening. A punctate 2 mm calculus is seen in the mid right ureter. Findings are consistent with at least partially obstructive urolithiasis though a superimposed ascending tract infection  is suspected as well given the extent of inflammatory findings about the ureter, bladder and kidney. 2. Circumferential thickening of the distal small bowel with exoenteric masslike extension into the mesentery and irregular adjacent mesenteric adenopathy without resulting obstruction. Findings are highly concerning for a small bowel malignancy, suspect small bowel. 3. Additional upper abdominal and retroperitoneal adenopathy is more nonspecific given the inflammatory changes in the upper abdomen. 4. Asymmetric rectal wall thickening is noted though this may be related to marked pelvic floor laxity and possible rectocele. Recommend correlation with direct visualization. 5. Age indeterminate bilateral sacral insufficiency fractures. 6. Left femoral head articular surface collapse likely secondary to severe osteoarthrosis and geode formation though some underlying osteonecrosis is not fully excluded. 7. 4 mm nodule in the left lung base. Given findings in the abdomen, metastatic disease is not fully excluded. 8. Cholelithiasis without evidence of acute cholecystitis. 9. Cardiomegaly and Three-vessel coronary artery atherosclerosis. Currently attempting to contact the ordering provider with a critical value result. Addendum will be submitted upon case discussion. Electronically Signed:  By: Lovena Le M.D. On: 01/30/2020 01:13       Subjective: Pt seen up in chair this AM. She reports feeling okay today.  Denies any abdominal, groin or flank pain, fevers or chills. N/V.  No acute events reproted.    Discharge Exam: Vitals:   01/31/20 0748 01/31/20 1100  BP: (!) 165/69 (!) 115/51  Pulse: 70 (!) 56  Resp: 18 18  Temp: 98.5 F (36.9 C) 97.9 F (36.6 C)  SpO2: 96%    Vitals:   01/30/20 2345 01/31/20 0425 01/31/20 0748 01/31/20 1100  BP: (!) 145/66 (!) 150/85 (!) 165/69 (!) 115/51  Pulse: 63 70 70 (!) 56  Resp: 20 16 18 18   Temp: 98 F (36.7 C) 98.4 F (36.9 C) 98.5 F (36.9 C) 97.9 F (36.6 C)   TempSrc:      SpO2: 95% 94% 96%   Weight:      Height:        General: Pt is alert, awake, not in acute distress Cardiovascular: RRR, S1/S2 +, no rubs, no gallops Respiratory: CTA bilaterally, no wheezing, no rhonchi Abdominal: Soft, NT, ND, bowel sounds + Extremities: no edema, no cyanosis    The results of significant diagnostics from this hospitalization (including imaging, microbiology, ancillary and laboratory) are listed below for reference.     Microbiology: Recent Results (from the past 240 hour(s))  Respiratory Panel by RT PCR (Flu A&B, Covid) - Nasopharyngeal Swab     Status: None   Collection Time: 01/29/20 10:19 PM   Specimen: Nasopharyngeal Swab  Result Value Ref Range Status   SARS Coronavirus 2 by RT PCR NEGATIVE NEGATIVE Final    Comment: (NOTE) SARS-CoV-2 target nucleic acids are NOT DETECTED.  The SARS-CoV-2 RNA is generally detectable in upper respiratoy specimens during the acute phase of infection. The lowest concentration of SARS-CoV-2 viral copies this assay can detect is 131 copies/mL. A negative result does not preclude SARS-Cov-2 infection and should not be used as the sole basis for treatment or other patient management decisions. A negative result may occur with  improper specimen collection/handling, submission of specimen other than nasopharyngeal swab, presence of viral mutation(s) within the areas targeted by this assay, and inadequate number of viral copies (<131 copies/mL). A negative result must be combined with clinical observations, patient history, and epidemiological information. The expected result is Negative.  Fact Sheet for Patients:  PinkCheek.be  Fact Sheet for Healthcare Providers:  GravelBags.it  This test is no t yet approved or cleared by the [place] FDA and  has been authorized for detection and/or diagnosis of SARS-CoV-2 by FDA under an Emergency Use  Authorization (EUA). This EUA will remain  in effect (meaning this test can be used) for the duration of the COVID-19 declaration under Section 564(b)(1) of the Act, 21 U.S.C. section 360bbb-3(b)(1), unless the authorization is terminated or revoked sooner.     Influenza A by PCR NEGATIVE NEGATIVE Final   Influenza B by PCR NEGATIVE NEGATIVE Final    Comment: (NOTE) The Xpert Xpress SARS-CoV-2/FLU/RSV assay is intended as an aid in  the diagnosis of influenza from Nasopharyngeal swab specimens and  should not be used as a sole basis for treatment. Nasal washings and  aspirates are unacceptable for Xpert Xpress SARS-CoV-2/FLU/RSV  testing.  Fact Sheet for Patients: PinkCheek.be  Fact Sheet for Healthcare Providers: GravelBags.it  This test is not yet approved or cleared by the [place] FDA and  has been authorized for detection and/or diagnosis  of SARS-CoV-2 by  FDA under an Emergency Use Authorization (EUA). This EUA will remain  in effect (meaning this test can be used) for the duration of the  Covid-19 declaration under Section 564(b)(1) of the Act, 21  U.S.C. section 360bbb-3(b)(1), unless the authorization is  terminated or revoked. Performed at [facility], [address], [place], [place] [address]   Urine Culture     Status: Abnormal (Preliminary result)   Collection Time: 01/29/20 10:23 PM   Specimen: Urine, Random  Result Value Ref Range Status   Specimen Description   Final    URINE, RANDOM Performed at [facility], [address] [place], [place] [address]    Special Requests   Final    NONE Performed at [facility], [address] [place], [place] [address]    Culture (A)  Final    80,000 COLONIES/mL ESCHERICHIA COLI SUSCEPTIBILITIES TO FOLLOW Performed at [facility], [place] [address], [place], [place] [address]    Report Status PENDING  Incomplete      Labs: BNP (last 3 results) No results for input(s): BNP in the last 8760 hours. Basic Metabolic Panel: Recent Labs  Lab 01/29/20 2022 01/29/20 2315 01/30/20 0505 01/31/20 0458  NA 131*  --  131* 134*  K 3.8  --  3.7 3.4*  CL 93*  --  96* 98  CO2 27  --  24 24  GLUCOSE 114*  --  101* 92  BUN 36*  --  34* 25*  CREATININE 1.07*  --  0.88 0.67  CALCIUM 8.4*  --  8.3* 8.2*  MG  --  2.4 2.3  --   PHOS  --  3.1 2.8  --    Liver Function Tests: Recent Labs  Lab 01/29/20 2022 01/30/20 0505  AST 66* 67*  ALT 32 35  ALKPHOS 78 75  BILITOT 1.0 0.9  PROT 7.8 7.4  ALBUMIN 3.2* 3.3*   No results for input(s): LIPASE, AMYLASE in the last 168 hours. No results for input(s): AMMONIA in the last 168 hours. CBC: Recent Labs  Lab 01/29/20 2022 01/30/20 0505 01/31/20 0458  WBC 12.4* 10.3 6.3  NEUTROABS 10.0* 7.6 4.6  HGB 11.6* 11.8* 10.1*  HCT 35.2* 34.7* 31.2*  MCV 83.6 81.8 84.1  PLT 219 217 189   Cardiac Enzymes: Recent Labs  Lab 01/29/20 2257 01/30/20 0505 01/31/20 0458  CKTOTAL 1,019* 890* 367*   BNP: Invalid input(s): POCBNP CBG: No results for input(s): GLUCAP in the last 168 hours. D-Dimer No results for input(s): DDIMER in the last 72 hours. Hgb A1c No results for input(s): HGBA1C in the last 72 hours. Lipid Profile No results for input(s): CHOL, HDL, LDLCALC, TRIG, CHOLHDL, LDLDIRECT in the last 72 hours. Thyroid function studies Recent Labs    01/30/20 0505  TSH 2.389   Anemia work up Recent Labs    01/29/20 2257 01/30/20 0505  VITAMINB12  --  1,196*  FOLATE 18.8  --   FERRITIN 130  --   TIBC 276  --   IRON 27*  --   RETICCTPCT  --  1.6   Urinalysis    Component Value Date/Time   COLORURINE YELLOW (A) 01/29/2020 2022   APPEARANCEUR TURBID (A) 01/29/2020 2022   APPEARANCEUR Clear 09/05/2018 1041   LABSPEC 1.018 01/29/2020 2022   PHURINE 5.0 01/29/2020 2022   GLUCOSEU NEGATIVE 01/29/2020 2022   HGBUR LARGE (A) 01/29/2020 2022    BILIRUBINUR NEGATIVE 01/29/2020 2022  BILIRUBINUR Negative 09/05/2018 1041   KETONESUR 5 (A) 01/29/2020 2022   PROTEINUR 100 (A) 01/29/2020 2022   NITRITE NEGATIVE 01/29/2020 2022   LEUKOCYTESUR LARGE (A) 01/29/2020 2022   Sepsis Labs Invalid input(s): PROCALCITONIN,  WBC,  LACTICIDVEN Microbiology Recent Results (from the past 240 hour(s))  Respiratory Panel by RT PCR (Flu A&B, Covid) - Nasopharyngeal Swab     Status: None   Collection Time: 01/29/20 10:19 PM   Specimen: Nasopharyngeal Swab  Result Value Ref Range Status   SARS Coronavirus 2 by RT PCR NEGATIVE NEGATIVE Final    Comment: (NOTE) SARS-CoV-2 target nucleic acids are NOT DETECTED.  The SARS-CoV-2 RNA is generally detectable in upper respiratoy specimens during the acute phase of infection. The lowest concentration of SARS-CoV-2 viral copies this assay can detect is 131 copies/mL. A negative result does not preclude SARS-Cov-2 infection and should not be used as the sole basis for treatment or other patient management decisions. A negative result may occur with  improper specimen collection/handling, submission of specimen other than nasopharyngeal swab, presence of viral mutation(s) within the areas targeted by this assay, and inadequate number of viral copies (<131 copies/mL). A negative result must be combined with clinical observations, patient history, and epidemiological information. The expected result is Negative.  Fact Sheet for Patients:  PinkCheek.be  Fact Sheet for Healthcare Providers:  GravelBags.it  This test is no t yet approved or cleared by the [place] FDA and  has been authorized for detection and/or diagnosis of SARS-CoV-2 by FDA under an Emergency Use Authorization (EUA). This EUA will remain  in effect (meaning this test can be used) for the duration of the COVID-19 declaration under Section 564(b)(1) of the Act, 21  U.S.C. section 360bbb-3(b)(1), unless the authorization is terminated or revoked sooner.     Influenza A by PCR NEGATIVE NEGATIVE Final   Influenza B by PCR NEGATIVE NEGATIVE Final    Comment: (NOTE) The Xpert Xpress SARS-CoV-2/FLU/RSV assay is intended as an aid in  the diagnosis of influenza from Nasopharyngeal swab specimens and  should not be used as a sole basis for treatment. Nasal washings and  aspirates are unacceptable for Xpert Xpress SARS-CoV-2/FLU/RSV  testing.  Fact Sheet for Patients: PinkCheek.be  Fact Sheet for Healthcare Providers: GravelBags.it  This test is not yet approved or cleared by the [place] FDA and  has been authorized for detection and/or diagnosis of SARS-CoV-2 by  FDA under an Emergency Use Authorization (EUA). This EUA will remain  in effect (meaning this test can be used) for the duration of the  Covid-19 declaration under Section 564(b)(1) of the Act, 21  U.S.C. section 360bbb-3(b)(1), unless the authorization is  terminated or revoked. Performed at [facility], [address], [place], [place] [address]   Urine Culture     Status: Abnormal (Preliminary result)   Collection Time: 01/29/20 10:23 PM   Specimen: Urine, Random  Result Value Ref Range Status   Specimen Description   Final    URINE, RANDOM Performed at [facility], [address], [place], [place] [address]    Special Requests   Final    NONE Performed at [facility], [address] [place], [place] [address]    Culture (A)  Final    80,000 COLONIES/mL ESCHERICHIA COLI SUSCEPTIBILITIES TO FOLLOW Performed at [facility], [place] [address], [place], [place] [address]    Report Status PENDING  Incomplete     Time coordinating discharge: Over  30 minutes  SIGNED:   Ezekiel Slocumb, DO [facility] 01/31/2020, 1:17 PM   If 7PM-7AM, please contact  night-coverage [email or web address]

## 2020-02-01 DIAGNOSIS — N201 Calculus of ureter: Secondary | ICD-10-CM | POA: Diagnosis not present

## 2020-02-01 DIAGNOSIS — F0390 Unspecified dementia without behavioral disturbance: Secondary | ICD-10-CM | POA: Diagnosis not present

## 2020-02-01 DIAGNOSIS — N10 Acute tubulo-interstitial nephritis: Secondary | ICD-10-CM | POA: Diagnosis not present

## 2020-02-01 DIAGNOSIS — K6389 Other specified diseases of intestine: Secondary | ICD-10-CM | POA: Diagnosis not present

## 2020-02-01 LAB — URINE CULTURE: Culture: 80000 — AB

## 2020-02-22 DIAGNOSIS — F0390 Unspecified dementia without behavioral disturbance: Secondary | ICD-10-CM | POA: Diagnosis not present

## 2020-02-22 DIAGNOSIS — K219 Gastro-esophageal reflux disease without esophagitis: Secondary | ICD-10-CM | POA: Diagnosis not present

## 2020-02-22 DIAGNOSIS — I10 Essential (primary) hypertension: Secondary | ICD-10-CM | POA: Diagnosis not present

## 2020-02-22 DIAGNOSIS — N201 Calculus of ureter: Secondary | ICD-10-CM | POA: Diagnosis not present

## 2020-02-23 DIAGNOSIS — B351 Tinea unguium: Secondary | ICD-10-CM | POA: Diagnosis not present

## 2020-02-27 DIAGNOSIS — Z961 Presence of intraocular lens: Secondary | ICD-10-CM | POA: Diagnosis not present

## 2020-02-27 DIAGNOSIS — H3530 Unspecified macular degeneration: Secondary | ICD-10-CM | POA: Diagnosis not present

## 2020-03-14 DIAGNOSIS — Z20828 Contact with and (suspected) exposure to other viral communicable diseases: Secondary | ICD-10-CM | POA: Diagnosis not present

## 2020-03-14 DIAGNOSIS — U071 COVID-19: Secondary | ICD-10-CM | POA: Diagnosis not present

## 2020-03-19 DIAGNOSIS — R279 Unspecified lack of coordination: Secondary | ICD-10-CM | POA: Diagnosis not present

## 2020-03-19 DIAGNOSIS — Z7401 Bed confinement status: Secondary | ICD-10-CM | POA: Diagnosis not present

## 2020-03-19 DIAGNOSIS — M2550 Pain in unspecified joint: Secondary | ICD-10-CM | POA: Diagnosis not present

## 2020-03-20 ENCOUNTER — Telehealth: Payer: Self-pay

## 2020-03-20 NOTE — Telephone Encounter (Signed)
Received completed paperwork from [facility] services. Called and spoke to Rubys daughter Curlene Dolphin and instructed her to come and pick up paperwork. Paperwork has been copied and copy has been placed in Scan.

## 2020-03-29 DIAGNOSIS — R918 Other nonspecific abnormal finding of lung field: Secondary | ICD-10-CM | POA: Diagnosis not present

## 2020-03-29 DIAGNOSIS — I10 Essential (primary) hypertension: Secondary | ICD-10-CM | POA: Diagnosis not present

## 2020-03-29 DIAGNOSIS — F0390 Unspecified dementia without behavioral disturbance: Secondary | ICD-10-CM | POA: Diagnosis not present

## 2020-03-29 DIAGNOSIS — K6389 Other specified diseases of intestine: Secondary | ICD-10-CM | POA: Diagnosis not present

## 2020-04-03 IMAGING — CR CHEST - 2 VIEW
1 series · 2 of 2 positions shown · non-contrast
Comparison: 03/02/2015 and CT from 03/03/2015

CLINICAL DATA: [AGE] with nonproductive cough.

EXAM:
CHEST - 2 VIEW

[Series 1: dg chest 2 view · 0.14mm/px · 2 of 2 slices shown]
[im 1/2]
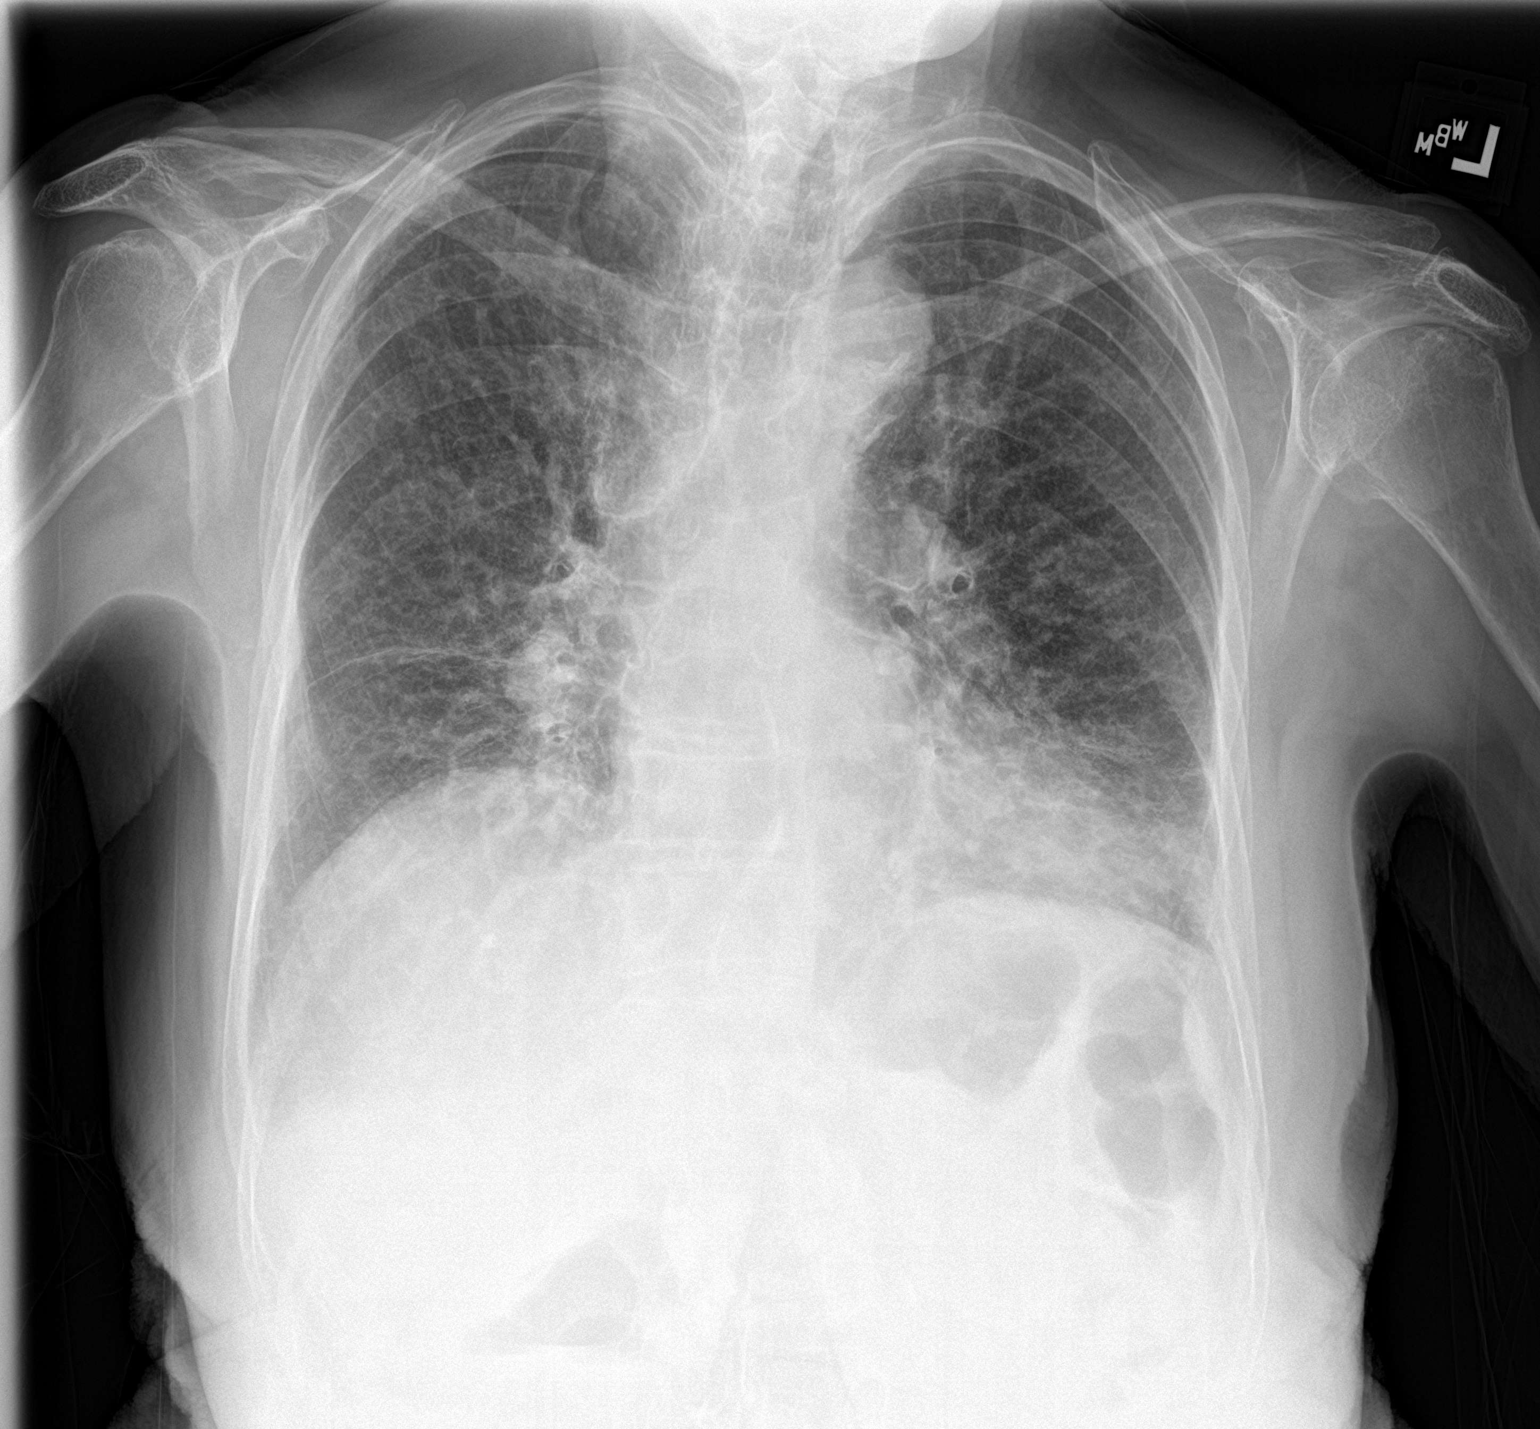
[im 2/2]
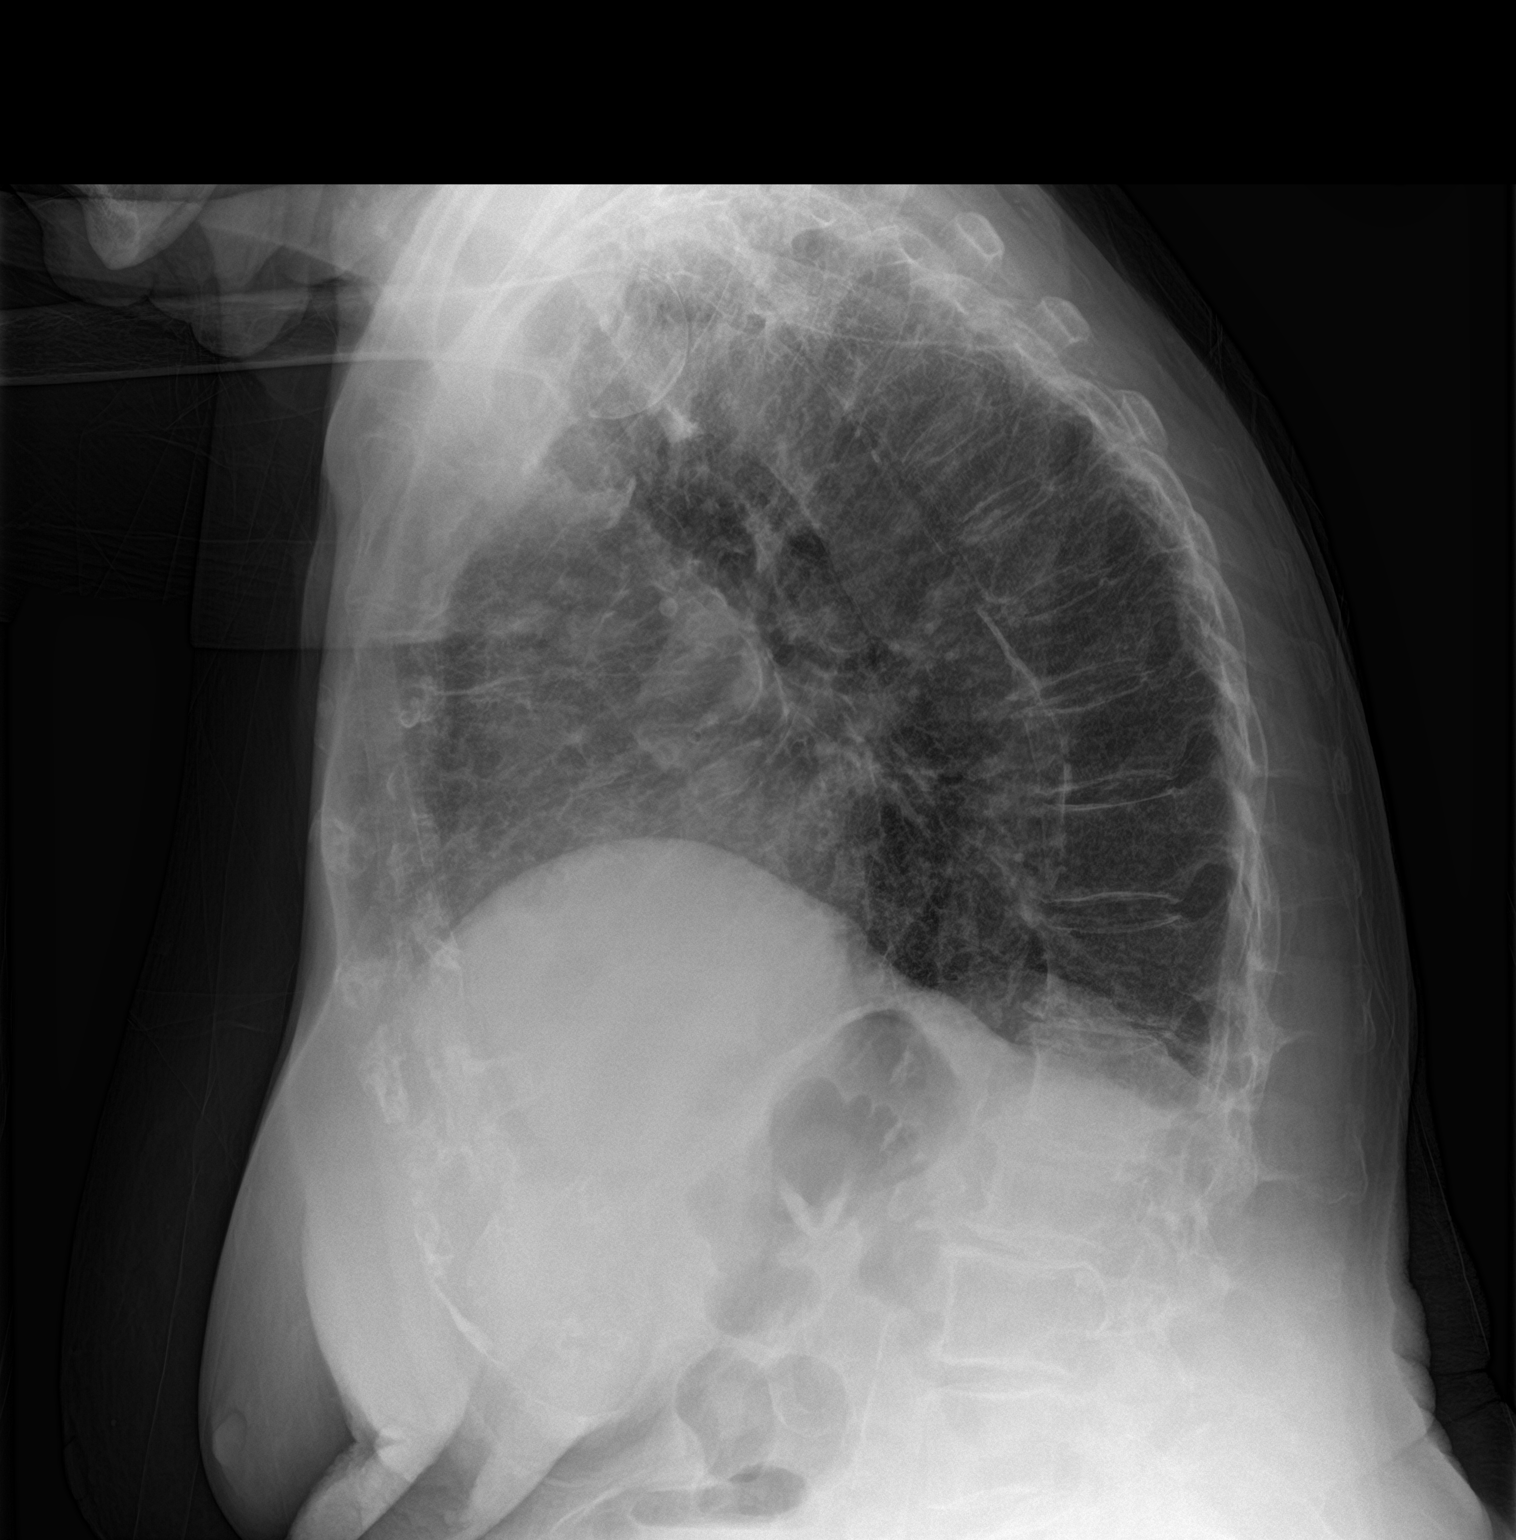

[2 of 2 positions shown; findings below may reference images not displayed]

FINDINGS: Slightly decreased lung volumes with coarse lung markings. Findings
are suggestive for chronic changes. No focal airspace disease or
consolidation. Heart and mediastinum are within normal limits.
Trachea is midline. Atherosclerotic calcifications at the aortic
arch. No large pleural effusions. No acute bone abnormality.
IMPRESSION: Chronic lung changes without acute findings.

## 2020-04-10 DIAGNOSIS — F0280 Dementia in other diseases classified elsewhere without behavioral disturbance: Secondary | ICD-10-CM | POA: Diagnosis not present

## 2020-04-10 DIAGNOSIS — G311 Senile degeneration of brain, not elsewhere classified: Secondary | ICD-10-CM | POA: Diagnosis not present

## 2020-04-10 DIAGNOSIS — N390 Urinary tract infection, site not specified: Secondary | ICD-10-CM | POA: Diagnosis not present

## 2020-04-10 DIAGNOSIS — N201 Calculus of ureter: Secondary | ICD-10-CM | POA: Diagnosis not present

## 2020-05-10 DIAGNOSIS — F419 Anxiety disorder, unspecified: Secondary | ICD-10-CM | POA: Diagnosis not present

## 2020-05-10 DIAGNOSIS — F332 Major depressive disorder, recurrent severe without psychotic features: Secondary | ICD-10-CM | POA: Diagnosis not present

## 2020-05-10 DIAGNOSIS — R4189 Other symptoms and signs involving cognitive functions and awareness: Secondary | ICD-10-CM | POA: Diagnosis not present

## 2020-05-30 DIAGNOSIS — R4189 Other symptoms and signs involving cognitive functions and awareness: Secondary | ICD-10-CM | POA: Diagnosis not present

## 2020-05-30 DIAGNOSIS — F419 Anxiety disorder, unspecified: Secondary | ICD-10-CM | POA: Diagnosis not present

## 2020-06-07 DIAGNOSIS — R4189 Other symptoms and signs involving cognitive functions and awareness: Secondary | ICD-10-CM | POA: Diagnosis not present

## 2020-06-07 DIAGNOSIS — F419 Anxiety disorder, unspecified: Secondary | ICD-10-CM | POA: Diagnosis not present

## 2020-06-07 DIAGNOSIS — F332 Major depressive disorder, recurrent severe without psychotic features: Secondary | ICD-10-CM | POA: Diagnosis not present

## 2020-07-09 DIAGNOSIS — I10 Essential (primary) hypertension: Secondary | ICD-10-CM | POA: Diagnosis not present

## 2020-07-09 DIAGNOSIS — R918 Other nonspecific abnormal finding of lung field: Secondary | ICD-10-CM | POA: Diagnosis not present

## 2020-07-09 DIAGNOSIS — F0390 Unspecified dementia without behavioral disturbance: Secondary | ICD-10-CM | POA: Diagnosis not present

## 2020-07-09 DIAGNOSIS — Z0000 Encounter for general adult medical examination without abnormal findings: Secondary | ICD-10-CM | POA: Diagnosis not present

## 2020-07-11 DIAGNOSIS — R600 Localized edema: Secondary | ICD-10-CM | POA: Diagnosis not present

## 2020-07-11 DIAGNOSIS — I739 Peripheral vascular disease, unspecified: Secondary | ICD-10-CM | POA: Diagnosis not present

## 2020-07-11 DIAGNOSIS — B351 Tinea unguium: Secondary | ICD-10-CM | POA: Diagnosis not present

## 2020-08-19 DIAGNOSIS — Z789 Other specified health status: Secondary | ICD-10-CM | POA: Diagnosis not present

## 2020-08-19 DIAGNOSIS — I10 Essential (primary) hypertension: Secondary | ICD-10-CM | POA: Diagnosis not present

## 2020-08-19 DIAGNOSIS — F0390 Unspecified dementia without behavioral disturbance: Secondary | ICD-10-CM | POA: Diagnosis not present

## 2020-11-04 DIAGNOSIS — G311 Senile degeneration of brain, not elsewhere classified: Secondary | ICD-10-CM | POA: Diagnosis not present

## 2020-11-04 DIAGNOSIS — N390 Urinary tract infection, site not specified: Secondary | ICD-10-CM | POA: Diagnosis not present

## 2020-11-04 DIAGNOSIS — I10 Essential (primary) hypertension: Secondary | ICD-10-CM | POA: Diagnosis not present

## 2020-11-04 DIAGNOSIS — F0280 Dementia in other diseases classified elsewhere without behavioral disturbance: Secondary | ICD-10-CM | POA: Diagnosis not present

## 2020-12-12 DIAGNOSIS — F0390 Unspecified dementia without behavioral disturbance: Secondary | ICD-10-CM | POA: Diagnosis not present

## 2020-12-12 DIAGNOSIS — R918 Other nonspecific abnormal finding of lung field: Secondary | ICD-10-CM | POA: Diagnosis not present

## 2020-12-12 DIAGNOSIS — Z789 Other specified health status: Secondary | ICD-10-CM | POA: Diagnosis not present

## 2020-12-12 DIAGNOSIS — K6389 Other specified diseases of intestine: Secondary | ICD-10-CM | POA: Diagnosis not present

## 2021-03-24 DIAGNOSIS — K6389 Other specified diseases of intestine: Secondary | ICD-10-CM | POA: Diagnosis not present

## 2021-03-24 DIAGNOSIS — F0390 Unspecified dementia without behavioral disturbance: Secondary | ICD-10-CM | POA: Diagnosis not present

## 2021-03-24 DIAGNOSIS — Z789 Other specified health status: Secondary | ICD-10-CM | POA: Diagnosis not present

## 2021-03-24 DIAGNOSIS — R918 Other nonspecific abnormal finding of lung field: Secondary | ICD-10-CM | POA: Diagnosis not present

## 2021-03-27 DIAGNOSIS — D649 Anemia, unspecified: Secondary | ICD-10-CM | POA: Diagnosis not present

## 2021-05-27 DIAGNOSIS — R918 Other nonspecific abnormal finding of lung field: Secondary | ICD-10-CM | POA: Diagnosis not present

## 2021-05-27 DIAGNOSIS — F0390 Unspecified dementia without behavioral disturbance: Secondary | ICD-10-CM | POA: Diagnosis not present

## 2021-05-27 DIAGNOSIS — Z789 Other specified health status: Secondary | ICD-10-CM | POA: Diagnosis not present

## 2021-05-27 DIAGNOSIS — K6389 Other specified diseases of intestine: Secondary | ICD-10-CM | POA: Diagnosis not present

## 2021-08-25 IMAGING — DX DG CHEST 1V PORT
1 series · 1 of 1 positions shown · non-contrast
Comparison: 09/07/2018

CLINICAL DATA: Weakness

EXAM:
PORTABLE CHEST 1 VIEW

[chest ap]
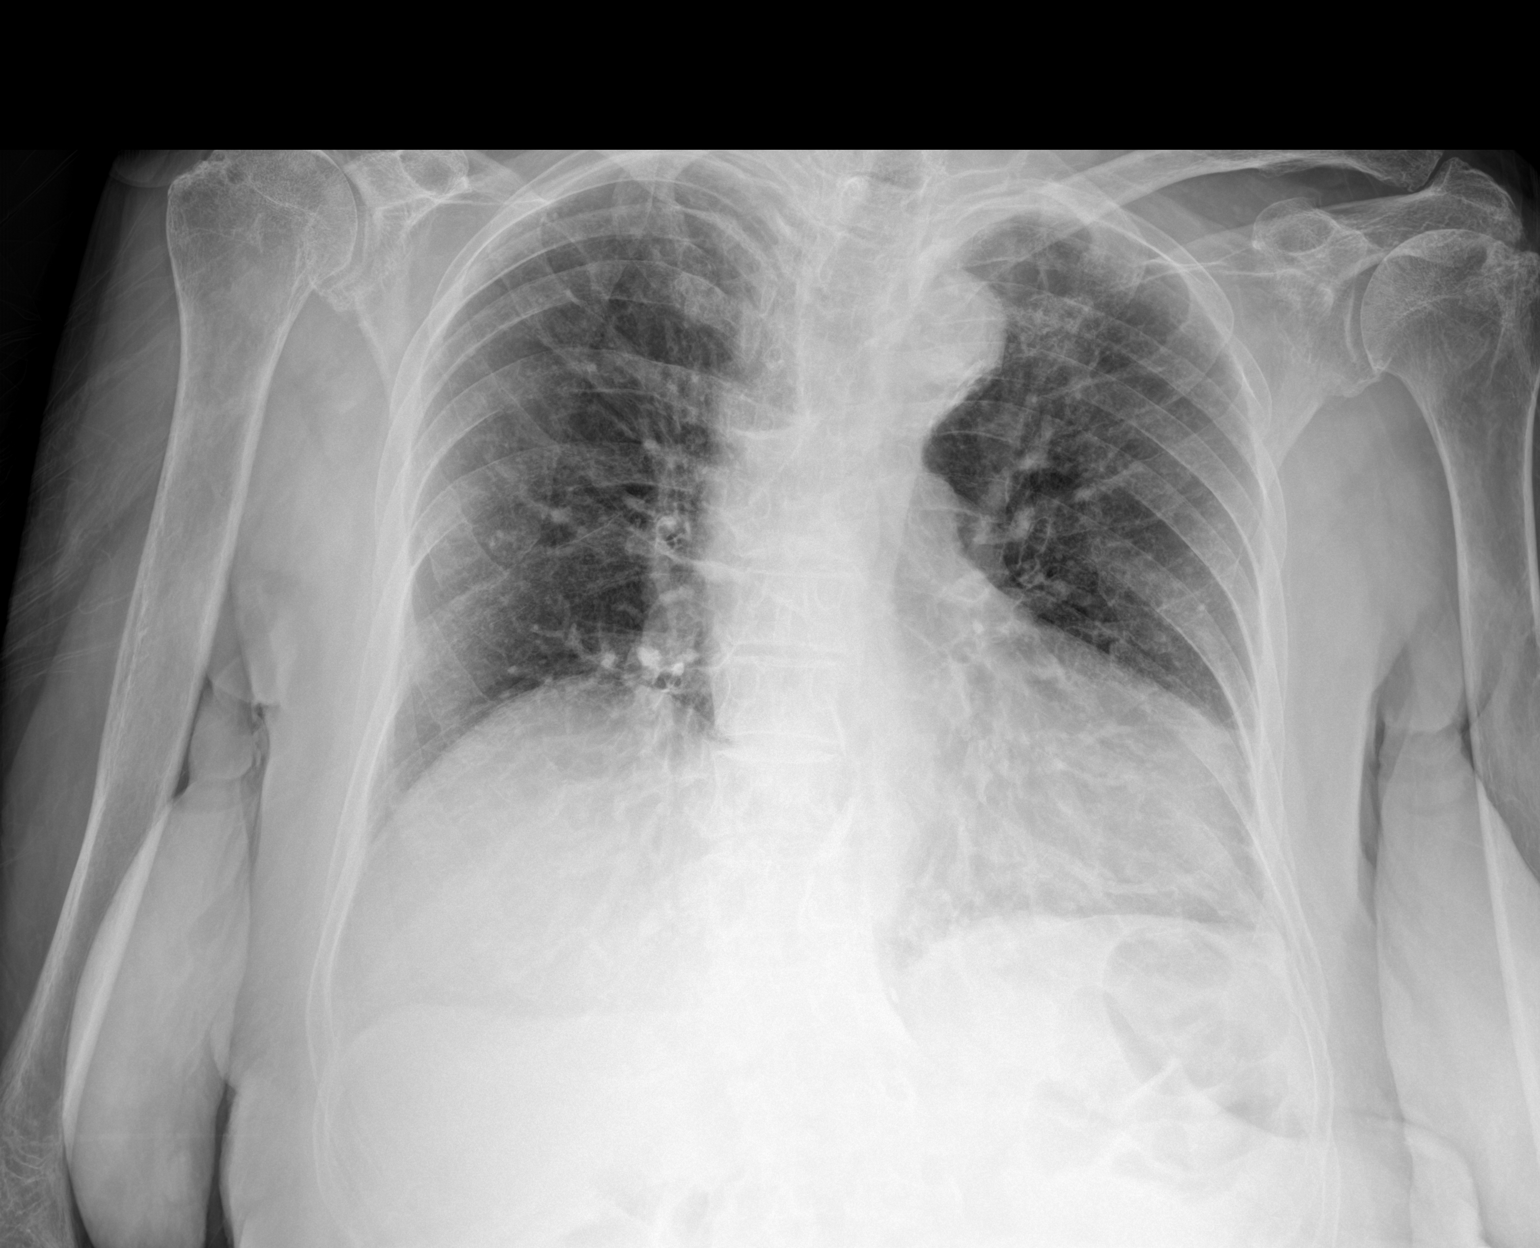

[1 of 1 positions shown; findings below may reference images not displayed]

FINDINGS: Mild cardiac enlargement without heart failure. No edema or
effusion. Elevated right hemidiaphragm with mild right lower lobe
atelectasis. Negative for pneumonia.
IMPRESSION: No active disease.

## 2021-09-13 DEATH — deceased
